# Patient Record
Sex: Female | Born: 1990
Health system: Southern US, Community
[De-identification: ages and names within clinical notes are randomized; demographics above are authoritative.]

## PROBLEM LIST (undated history)

## (undated) DIAGNOSIS — E109 Type 1 diabetes mellitus without complications: Secondary | ICD-10-CM

## (undated) DIAGNOSIS — F909 Attention-deficit hyperactivity disorder, unspecified type: Secondary | ICD-10-CM

## (undated) DIAGNOSIS — F419 Anxiety disorder, unspecified: Secondary | ICD-10-CM

## (undated) DIAGNOSIS — Z8619 Personal history of other infectious and parasitic diseases: Secondary | ICD-10-CM

## (undated) DIAGNOSIS — A64 Unspecified sexually transmitted disease: Secondary | ICD-10-CM

## (undated) DIAGNOSIS — U071 COVID-19: Secondary | ICD-10-CM

## (undated) DIAGNOSIS — R112 Nausea with vomiting, unspecified: Secondary | ICD-10-CM

## (undated) DIAGNOSIS — R519 Headache, unspecified: Secondary | ICD-10-CM

## (undated) DIAGNOSIS — Z9889 Other specified postprocedural states: Secondary | ICD-10-CM

## (undated) DIAGNOSIS — Z98891 History of uterine scar from previous surgery: Secondary | ICD-10-CM

## (undated) HISTORY — DX: Unspecified sexually transmitted disease: A64

## (undated) HISTORY — DX: COVID-19: U07.1

## (undated) HISTORY — DX: Anxiety disorder, unspecified: F41.9

## (undated) HISTORY — DX: Type 1 diabetes mellitus without complications: E10.9

## (undated) HISTORY — PX: WISDOM TOOTH EXTRACTION: SHX21

## (undated) HISTORY — DX: Attention-deficit hyperactivity disorder, unspecified type: F90.9

---
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
## 2010-09-03 ENCOUNTER — Ambulatory Visit: Payer: Self-pay | Admitting: Family Medicine

## 2010-09-09 ENCOUNTER — Ambulatory Visit: Payer: Self-pay | Admitting: Family Medicine

## 2011-12-05 ENCOUNTER — Emergency Department: Payer: Self-pay | Admitting: *Deleted

## 2013-12-05 ENCOUNTER — Encounter: Payer: Self-pay | Admitting: Obstetrics & Gynecology

## 2013-12-09 ENCOUNTER — Encounter: Payer: Self-pay | Admitting: Obstetrics and Gynecology

## 2014-01-06 ENCOUNTER — Encounter: Payer: Self-pay | Admitting: Maternal and Fetal Medicine

## 2014-01-28 ENCOUNTER — Encounter: Payer: Self-pay | Admitting: Pediatric Cardiology

## 2014-02-17 ENCOUNTER — Encounter: Payer: Self-pay | Admitting: Maternal & Fetal Medicine

## 2014-03-17 ENCOUNTER — Encounter: Payer: Self-pay | Admitting: Obstetrics and Gynecology

## 2014-04-14 ENCOUNTER — Encounter: Payer: Self-pay | Admitting: Maternal & Fetal Medicine

## 2014-04-21 ENCOUNTER — Encounter: Payer: Self-pay | Admitting: Obstetrics and Gynecology

## 2014-05-12 ENCOUNTER — Encounter: Payer: Self-pay | Admitting: Maternal & Fetal Medicine

## 2014-05-23 ENCOUNTER — Inpatient Hospital Stay: Payer: Self-pay | Admitting: Obstetrics and Gynecology

## 2014-05-23 LAB — CBC WITH DIFFERENTIAL/PLATELET
BASOS ABS: 0.1 10*3/uL (ref 0.0–0.1)
Basophil %: 0.7 %
EOS ABS: 0 10*3/uL (ref 0.0–0.7)
Eosinophil %: 0.4 %
HCT: 34.7 % — ABNORMAL LOW (ref 35.0–47.0)
HGB: 11.4 g/dL — ABNORMAL LOW (ref 12.0–16.0)
LYMPHS PCT: 22.5 %
Lymphocyte #: 2.1 10*3/uL (ref 1.0–3.6)
MCH: 27.9 pg (ref 26.0–34.0)
MCHC: 32.8 g/dL (ref 32.0–36.0)
MCV: 85 fL (ref 80–100)
MONO ABS: 0.9 x10 3/mm (ref 0.2–0.9)
MONOS PCT: 10.1 %
NEUTROS ABS: 6.1 10*3/uL (ref 1.4–6.5)
NEUTROS PCT: 66.3 %
PLATELETS: 296 10*3/uL (ref 150–440)
RBC: 4.07 10*6/uL (ref 3.80–5.20)
RDW: 13 % (ref 11.5–14.5)
WBC: 9.2 10*3/uL (ref 3.6–11.0)

## 2014-05-23 LAB — GC/CHLAMYDIA PROBE AMP

## 2014-05-24 LAB — HEMATOCRIT: HCT: 31.6 % — AB (ref 35.0–47.0)

## 2015-01-24 NOTE — Consult Note (Signed)
Referral Information:  Reason for Referral Followup consultation for type I diabetes in pregnancy   Referring Physician [facility]   Prenatal Hx Tsosie BillingBrandice is a 24 year-old G1 P0 at 4923 4/7 weeks ([facility] who returns for followup consultation in setting of Type I diabetes. She had a [facility].  She has an [facility] on 01/22/14 and [facility].  Wyolene is using her pump. Her A1C at start of pregnancy was 11 and then down to 8.8 in early March. She reports that her most recent A1C was 6%. These are being performed by her endocrinologist.  She calls her blood glucose values in to her endocrinologist weekly and adjustments are made then.  She also checking 3 am values.  She has no complaints.   Home Medications:  Medication Instructions Status  Prenatal Multivitamins Prenatal Multivitamins oral tablet 1 tab(s) orally once a day Active  NovoLOG 100 units/mL subcutaneous solution 35.4 unit(s) subcutaneous  Active  Diclegis 10 mg-10 mg oral delayed release tablet 1 tab(s) orally once a day (at bedtime), As Needed - for Nausea, Vomiting Active  Aspir 81 81 mg oral tablet 1 tab(s) orally once a day Active   Allergies:   Penicillin: Anaphylaxis  Vital Signs/Notes:  Nursing Vital Signs: **Vital Signs.:   18-May-15 09:10  Vital Signs Type Routine  Temperature Temperature (F) 97.3  Celsius 36.2  Temperature Source oral  Pulse Pulse 110  Respirations Respirations 18  Systolic BP Systolic BP 119  Diastolic BP (mmHg) Diastolic BP (mmHg) 66  Mean BP 83  Pulse Ox % Pulse Ox % 98  Pulse Ox Activity Level  At rest  Oxygen Delivery Room Air/ 21 %   Perinatal Consult:  PMed Hx Rubella Nonimmune    Additional Lab/Radiology Notes Review of sugar log (May 1-May 12, recent values have not been uploaded to web site; May 8th excluded--injection site problem resulted in  hyperglycemia) FS: [phone],63,188,146,136,89,195,219 2hr PP breakfast: 130,128,46,98,102,201,178.118,198,234 2hr PP lunch: 562,130,865,784,69,629,528,413137,253,172,119,99,154,125,264 2hr PP dinner: 115,141,92,109,163,117,169,103,235,101,88 May 8: 206/201/254/86   Impression/Recommendations:  Impression 24 year-old G1 P0 at 17 6/7 weeks with type I diabetes and markedly elevated hemoglobin A1c at time of conception (11%).   Although her her most recent A1C is down to 6%, overall, her glycemic control has worsened.  Her basal rate was recently decreased (due to a few episodes of symptomatic hypoglycemia) and should probably be increased given the increase in insulin resistance associated with advancing gestation.  I was able to contact Spenser about these changes and she will discuss further with her Endocrinologist.   Recommendations -I am concerned that her glycemic control is worsening. Her basal rate should probably be increased.  She may benefit from a food diary.  -She reports a [facility] and a normal fetal echocardiogram with [facility].  -Pt has been counseled regarding the use of a daily baby aspirin to decrease the risk for preeclampsia and is taking daily. -Continue insulin pump. She is providing her sugars weekly to her endocrinologist, who is then making adjustments in her pump settings. Continue to send sugars to endocrinology weekly--she will address worsening values after most recent changes and will try to keep track of her diet.  -Return in 4 weeks.  We have scheduled a growth ultrsound for the same date.  Please cancel this ultrasound if more convenient to the patient to  perform at [facility].  -Antenatal surveillance as outlined below.   Plan:  Ultrasound at what gestational ages Monthly > 28 weeks   Antepartum Testing Starting at 32 weeks, Weekly, Twice weekly testing beginning at 34 weeks    Total Time Spent with Patient 15  minutes   >50% of visit spent in couseling/coordination of care yes   Office Use Only 99213  Office Visit Level 3 ( ) EST exp prob focused outpt   Coding Description: MATERNAL CONDITIONS/HISTORY INDICATION(S).   Diabetes - DM.  Electronic Signatures: Consuelo Pandy (MD)  (Signed 18-May-15 14:24)  Authored: Referral, Home Medications, Allergies, Vital Signs/Notes, Consult, Lab/Radiology Notes, Impression, Plan, Billing, Coding Description   Last Updated: 18-May-15 14:24 by Consuelo Pandy (MD)

## 2015-01-24 NOTE — Op Note (Signed)
PATIENT NAME:  Jordan Leonard, Merlinda S MR#:  [id number] DATE OF BIRTH:  02/13/1991  DATE OF PROCEDURE:  05/23/2014  PREOPERATIVE DIAGNOSES: 1. Nonreassuring fetal monitoring. 2.  37 + 1 weeks estimated gestational age.  3. Insulin-dependent diabetic.   POSTOPERATIVE DIAGNOSES:  1. Nonreassuring fetal monitoring. 2.  37 + 1 weeks estimated gestational age.  3. Insulin-dependent diabetic.   PROCEDURE: Primary low transverse cesarean section.   SURGEON: Suzy Bouchardhomas J Monserat Prestigiacomo, M.D.   ANESTHESIA: Spinal.   INDICATION: A 24 year old gravida 1, para 0 patient, insulin-dependent diabetic on insulin pump came in at 4837 + 1 weeks estimated gestational age for an induction. The patient underwent Cervidil induction and had intermittent late decelerations with the Cervidil. This was removed. The patient continued to have contractions and developed fetal tachycardia and nonreassuring fetal monitoring.   PROCEDURE: After adequate spinal anesthesia, the patient was placed in the dorsal supine position with a hip roll under the right side. The patient received clindamycin prophylaxis with 120 mg gentamicin given her severe allergic reaction to penicillin. The patient was prepped and draped in normal sterile fashion. A Pfannenstiel incision was made 2 fingerbreadths above the symphysis pubis. Sharp dissection was used to identify the fascia. The fascia was opened in the midline and opened in a transverse fashion. The superior aspect of the fascia was grasped with Kocher clamps and the recti muscles dissected free. The inferior aspect of the fascia was grasped with Kocher clamps. Pyramidalis muscle was dissected free. Entry into the peritoneal cavity was accomplished sharply. The vesicouterine peritoneal fold was identified and opened in a transverse fashion. Bladder was reflected inferiorly. A low transverse uterine incision was made. Upon entry into the endometrial cavity, clear fluid resulted. The incision was  extended with blunt transverse traction. A loose nuchal cord was identified as head was delivered. Shoulders and body delivered without difficulty. A vigorous female was passed to pediatrician staff who assigned Apgar scores of 7 and 8. Placenta was manually delivered and the uterus was exteriorized. The endometrial cavity was wiped clean with laparotomy tape and the cervix was opened with a ring forceps and this was passed off the operative field. The uterine incision was closed with 1 chromic suture in a running locking fashion with good approximation of edges. Good hemostasis was noted. Fallopian tubes and ovaries appeared normal. The posterior cul-de-sac was irrigated and suctioned and the uterus was placed back into the abdominal cavity. The paracolic gutters were wiped clean with laparotomy tape and the uterine incision again appeared hemostatic. Interceed was placed over the uterine incision in a T-shaped fashion. The fascia was then closed with 0 Vicryl in a running nonlocking fashion with good approximation of edges. Good hemostasis was noted. Subcutaneous tissues were irrigated and bovied for hemostasis. The skin was reapproximated with staples. There were no complications. Estimated blood loss 500 mL. Intraoperative fluids: 1000 mL. The patient tolerated the procedure well and was taken to the recovery room in good condition.    ____________________________ Suzy Bouchardhomas J. Anaika Santillano, MD tjs:JT D: 05/23/2014 22:00:19 ET T: 05/24/2014 02:32:00 ET JOB#: [id number]  cc: Suzy Bouchardhomas J. Narciso Stoutenburg, MD, <Dictator> Suzy BouchardHOMAS J Delmer Kowalski MD ELECTRONICALLY SIGNED 05/24/2014 11:26

## 2015-01-24 NOTE — Consult Note (Signed)
Referral Information:  Reason for Referral Followup consultation for type I diabetes in pregnancy   Ref[facility] Physician [facility]   Prenatal Hx Jordan Leonard is a 24 year-old G1 P0 at 327 3/7 weeks (EDC 06/13/14) who returns for followup consultation in setting of Type I diabetes. She had a [facility].  She has [facility] on 01/22/14 and [facility].  WIth her pump, Jordan Leonard has brought her A1C  down from 11 at start of pregnancy to 8.8 in early March. She reports that her most recent A1C was 6.5%. These are being performed by her endocrinologist.  She calls her blood glucose values in to her endocrinologist weekly and adjustments are made then.  She also checking 3 am values.  She has no complaints.   Home Medications: Medication Instructions Status  Prenatal Multivitamins Prenatal Multivitamins oral tablet 1 tab(s) orally once a day Active  NovoLOG 100 units/mL subcutaneous solution 35.4 unit(s) subcutaneous  Active  Diclegis 10 mg-10 mg oral delayed release tablet 1 tab(s) orally once a day (at bedtime), As Needed - for Nausea, Vomiting Active  Aspir 81 81 mg oral tablet 1 tab(s) orally once a day Active   Allergies:   Penicillin: Anaphylaxis  Vital Signs/Notes:  Nursing Vital Signs:  **Vital Signs.:   15-Jun-15 14:00  Vital Signs Type Routine  Temperature Temperature (F) 98.6  Celsius 37  Temperature Source oral  Pulse Pulse 109  Respirations Respirations 18  Systolic BP Systolic BP 130  Diastolic BP (mmHg) Diastolic BP (mmHg) 75  Mean BP 93  Pulse Ox % Pulse Ox % 98  Pulse Ox Activity Level  At rest  Oxygen Delivery Room Air/ 21 %   Perinatal Consult:  PMed Hx Rubella Nonimmune     US:    15-Jun-15 14:44, MFM OB US, Follow Up, Per Fetus  MFM OB US, Follow Up, Per Fetus   Indication: Diabetes, Growth.    ____________________________________________________________________________  History: Age: 72 years.  ____________________________________________________________________________  Dating:  LMP:     09/06/2013     EDC:     06/13/2014     GA by LMP:     8747w3d  Earlier Assessment on:     12/05/2013     EDC:     06/15/2014     GA by earlier  assessment:     3362w1d  Current Scan on:     03/17/2014     EDC:     06/16/2014     GA by current scan:      8562w0d  Best Overall Assessment:     03/17/2014     EDC:     06/13/2014     Assessed GA:      1247w3d  The calculation of the gestational age by current scan was based on BPD, HC, AC,  FL and HUM.  The Best Overall Assessment is based on the LMP.  ____________________________________________________________________________  General Evaluation:  Fetal heart activity: present. Fetal heart rate: 130 bpm.   Presentation: breech.   Fetal movement: visible.   Amniotic Fluid: normal. AFI  13.5 cm.   Cord: 3 vessels.   Placenta: posterior fundal. No previa. Placenta Grade: Grade 0. Structure:  normal.     ____________________________________________________________________________  Anatomy Scan:  Singleton gestation.  Biometry:  BPD     63.1     mm      -  [redacted]w[redacted]d     ([redacted]w[redacted]d to [redacted]w[redacted]d)  HC     250.8     mm      -     [redacted]w[redacted]d     ([redacted]w[redacted]d to [redacted]w[redacted]d)  AC     237.2     mm      -     [redacted]w[redacted]d     ([redacted]w[redacted]d to [redacted]w[redacted]d)  FL     48.7     mm      -     [redacted]w[redacted]d     ([redacted]w[redacted]d to [redacted]w[redacted]d)  HUM     47.0     mm      -     [redacted]w[redacted]d  EFW (lbs/oz)     2 lbs     5 ozs  EFW (g)     1062     g     34th%       Fetal Anatomy:  Visualized with normal appearance: spine, gastrointestinal tract, kidneys,  bladder.    Brain: visualized previously.   Face: visualized previously.   Heart: 4-chamber heart and greatartery views were visualized normally.   Abdominal Wall: visualized previously.   Extremities: visualized  previously.    ____________________________________________________________________________  Maternal Structures:  Cervical length 38 mm.  ____________________________________________________________________________  Report Summary:  Impression: Single intrauterine pregnancy with an estimated gestational age of  [redacted] weeks 3 day(s).  Dating assigned by LMP concordant with ultrasound performed  at [facility] on 12/05/2013; measurements at that exam reported as  12 weeks 4 days.    Follow up ultrasound performed to assess growth.    Fetal anatomy visualized appears normal. Other views of fetal anatomy have  been documented previously.     Appropriate interval growth.    Recommend follow up ultrasound for growth in 4 weeks. CODING DESCRIPTION:   Diabetes.   Recommendations:     Thank you for allowing [place] to participate in her care.  Electronically signed by:  Kirby Funk, MD   Impression/Recommendations:  Impression 23 year-old G1P0 at 64 3/7 weeks with type I diabetes and markedly elevated hemoglobin A1c at time of conception (11%).   She has been able to bring her A1C down to 6.5% as of last week.  FBS range from 54-171 2hr post breakfast range from 108-180 2hr post lunch range from 85-181 2hr post dinner range from 72-141   Recommendations - Continue close surveillance and adjuments of her insulin regimen per endocrine recommendations. - She is s/p normal anatomic survey at [facility] on 4/22 and a normal fetal echocardiogram with [facility] on 4/28.  - She continues the use of a daily baby aspirin to decrease the risk for preeclampsia. - Last optho exam was last week  - Return in 4 weeks.  We have scheduled a growth ultrsound for the same date.  Please cancel this ultrasound if more convenient to the patient to perform at [facility].  - Antenatal surveillance weekly starting at 32 weeks, increasing to 2x weekly at 34  weeks; recommend delivery between 37-39 weeks depending on overall BS control and fetal well-being.   Plan:  Ultrasound at what gestational ages Monthly > 28 weeks   Antepartum Testing Starting at 32 weeks, Weekly, Twice weekly testing beginning at 34 weeks    Total Time Spent with Patient 15 minutes   >50% of visit spent in couseling/coordination of care yes   Office Use Only 99213  Office Visit Level 3 ( ) EST  exp prob focused outpt   Coding Description: MATERNAL CONDITIONS/HISTORY INDICATION(S).   Diabetes - DM.  Electronic Signatures: Kirby Funk (MD)  (Signed 15-Jun-15 15:12)  Authored: Referral, Home Medications, Allergies, Vital Signs/Notes, Consult, Radiology, Impression, Plan, Billing, Coding Description   Last Updated: 15-Jun-15 15:12 by Kirby Funk (MD)

## 2015-01-24 NOTE — Consult Note (Signed)
Referral Information:  Reason for Referral type 1 Diabetes on pump now pregnant   Referring Physician Gavin Potters- Acquanetta Belling CNM , Dr Arlice Colt Endocrine   Prenatal Hx 24 yo G1 P0 LMP 09/06/13 [facility] 06/12/14  s/p first tri screen and genetic counseling She is on a pump last hgb A1c 8.8, 11 at beginning of pregnancy  She is schedueld to discuss impact of type 1 DM on pregnancy, and pregnancy on her diabetes   Past Obstetrical Hx primigravida   Home Medications: Medication Instructions Status  NovoLOG 100 units/mL subcutaneous solution 37.5 unit(s) subcutaneous continuous basal rate with bolus for meals  Active  Prenatal Multivitamins Prenatal Multivitamins oral tablet 1 tab(s) orally once a day Active   Allergies:   Penicillin: Anaphylaxis  Vital Signs/Notes:  Nursing Vital Signs:  **Vital Signs.:   09-Mar-15 10:15  Vital Signs Type Routine    10:15  Pulse Pulse 102  Respirations Respirations 20    10:15  Systolic BP Systolic BP 135  Diastolic BP (mmHg) Diastolic BP (mmHg) 72  Mean BP 93    10:15  Pulse Ox % Pulse Ox % 100  Pulse Ox Activity Level  At rest  Oxygen Delivery Room Air/ 21 %   Perinatal Consult:  LMP 06-Sep-2013   PGyn Hx infertility  took 18 months to conceive   PMed Hx Rubella Nonimmune   Past Medical History cont'd 1 type 1 Diabetes- diagnosed at age 54 in [place] . She was not gaining weight and had back aches . Her mother recognized her excessive thirst and took her in and found her BS to be 530 admitted for early DKA . She had a number of providers, her dad was in the army. Other than initial dx she has not required hospitalization. She has been on a pump for 10 years . She had a CGM but found it to be too expensive to operate. Her initial Hgb A1c was 11 - She says she couldn't afford the pump supplies for a while.She has brought her hgb A1c  down to 8.8. She has an ophthamologist Dr Clydene Pugh in [place] that she saw in December. She denies any h/o  renal disease or foot ulcers.  She was being followed by [facility] and has now transferred her care to dr Jefm Petty at [place] . They are communicating every 7-10 days to adjust her pump. She recognizes her lows and will wake up - her recent lowest was 47 . She ahs glucagon and her husband knows how to use it. She uses about 37 units total /day basal and counts carbs. 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   Soc Hx married   Review Of Systems:  Subjective occ nausea   Tolerating Diet Yes  Nauseated   Medications/Allergies Reviewed Medications/Allergies reviewed    Additional Lab/Radiology Notes frist tri screen - pending RBS 150 pt showed me her medtronic flow sheet BS fluctuates between 67 -221   Impression/Recommendations:  Impression IUP at 13w 3d s/p first tri screen  1Type 1 dm on pump with elevated hgb A1c, no  end organ damage , feels lows , some difficutly acessing supplies , discussing care with Dr Jefm PettySalum q 7 -10 d  2rubella non immune 3elevated BMI 4 PCN allergy - anaphylaxis   Recommendations 1 I encouraged her  in her efforts to normalize her blood sugar, We discussed risk to her and her fetus of DM in pregnancy. I reviewed risk of lows, esp  in early with placental transfer pulling down her blood sugar further and faster. I reviewed risk of DKA esp as pregnancy progresses  2 I reviewed risk of fetal anomalies and recommended detailed u/s and echo  .  3 we reviewed risk to fetus of macrosomia and IUFD - I suggested serial scans in third for growht and twice weekly NSts at 32 weeks  4 we discussed delivery at  37-38 weeks given challenges in controlling her blood sugar, (if BS becomes more consistently normal and fetus normally grown could consider delivery at 39 ) we reviewed risk of cesarean at about 50% 5 Increased risk of preeclampsia pt reports no renal damage but I  still quoted an increased risk of preeclampsia - I recommended she start baby aspirin daily to decrease risk . 6 needs baseline TFTs and p/c ratio if not done already  7 10-15% risk of Type 1 DM for offspring - I encouraged breast feeding for health benefits including decreasing the risk of DM for infant 8pp rubella vaccine   Plan:  Genetic Counseling done 12/05/13   Prenatal Diagnosis Options Level II US, Fetal echo   Ultrasound at what gestational ages Monthly >24 weeks   Antepartum Testing Twice weekly, Starting at 32 weeks   Delivery Mode depending on EFW   Additional Testing Thyroid panel, HbA1C, baseline P/ C ratio   Delivery at what gestational age 26-39 w    Total Time Spent with Patient 30 minutes   >50% of visit spent in couseling/coordination of care yes   Office Use Only 99242  Level 2 (30min) NEW office consult exp prob focused   Coding Description: MATERNAL CONDITIONS/HISTORY INDICATION(S).   Diabetes - DM.  Electronic Signatures: Rondall AllegraLivingston, Doraine Schexnider Gresham (MD)  (Signed 09-Mar-15 12:46)  Authored: Referral, Home Medications, Allergies, Vital Signs/Notes, Consult, Exam, Lab/Radiology Notes, Impression, Plan, Billing, Coding Description   Last Updated: 09-Mar-15 12:46 by Rondall AllegraLivingston, Samella Lucchetti Gresham (MD)

## 2015-01-24 NOTE — Consult Note (Signed)
Referral Information:  Reason for Referral Followup consultation for type I diabetes in pregnancy   Ref[facility] Physician [facility]   Prenatal Hx Jordan Leonard is a 24 year-old G1 P0 at 4532 3/7 weeks (EDC 06/13/14) who returns for followup consultation in setting of Type I diabetes. She had a [facility].  She had [facility] on 01/[facility]/14 and a [facility] fetal [facility] on 01/28/14.  With her pump, Jordan Leonard has brought her A1C  down from 11 at start of pregnancy to a most recent A1C of 6.6%.  She sees her endocrinologist monthly and her BS are reviewed by her endocrinologist on a weekly basis so that adjustments can be made.   She has no complaints and feels that she is really maximizing her efforts to control her BS with meal planning and the pump.   Home Medications: Medication Instructions Status  Prenatal Multivitamins Prenatal Multivitamins oral tablet 1 tab(s) orally once a day Active  Diclegis 10 mg-10 mg oral delayed release tablet 1 tab(s) orally once a day (at bedtime), As Needed - for Nausea, Vomiting Active  Aspir 81 81 mg oral tablet 1 tab(s) orally once a day Active  NovoLOG 100 units/mL subcutaneous solution 38.55 unit(s) subcutaneous  Active   Allergies:   Penicillin: Anaphylaxis  Vital Signs/Notes:  Nursing Vital Signs: **Vital Signs.:   20-Jul-15 13:43  Vital Signs Type Routine  Temperature Temperature (F) 97.8  Celsius 36.5  Temperature Source oral  Pulse Pulse 112  Respirations Respirations 18  Systolic BP Systolic BP 142  Diastolic BP (mmHg) Diastolic BP (mmHg) 78  Mean BP 99    13:53  Vital Signs Type Recheck  Systolic BP Systolic BP 131  Diastolic BP (mmHg) Diastolic BP (mmHg) 74  Mean BP 93   Perinatal Consult:  PMed Hx Rubella Nonimmune   Impression/Recommendations:  Impression 24 year-old G1P0 at 2332 3/7 weeks with type I diabetes and markedly elevated hemoglobin A1c at  time of conception (11%).   She has been able to bring her A1C down to 6.6%.  Since her last visit: FBS range from 88-180 2hr post breakfast range from 148-286 (additional changes to her morning humalog were made) 2hr post lunch range from 63-200 (200 was the only outlier) 2hr post dinner range from 62-134 (134 was the only outlier)  Elevated BS are outliers with most BS in the past week closely in range with the exception for fasting and 2hr post breakfast.  Jordan Leonard has been working with endocrine closely to improve these numbers and the most recent ones have been significantly better.   Recommendations - Continue close surveillance and adjuments of her insulin regimen per endocrine recommendations. - She is s/p [facility] on [facility]/22 and a normal fetal echocardiogram with [facility] on 4/28.  - Continue daily baby aspirin to decrease the risk for preeclampsia. - Optho exam up to date - Return in 4 weeks.  We have scheduled a growth ultrasound for the same date.  Please cancel this ultrasound if more convenient to the patient to [facility].   -Antenatal surveillance weekly starting at 32 weeks, increasing to 2x weekly at 34 weeks; recommend delivery between 37-39 weeks depending on overall BS control and fetal well-being.  I congratulated Jordan Leonard on how hard she has been working as exemplified by the improvements in her BS.  She has been able to avoid overt lows  and she is aware that we are not aiming to treat outliers with changes in her insulin regimen but would rather look at dietary indiscretions.  Would still be careful about over correcting to avoid lows.  Morning BS remain high but she and her endocrinologist are working to improve these and the most recent post prandial breakfasts have been in the 140-150's range.  Consider insulin drip in labor and endocrine to make post delivery recommendations to her pump  settings.   Plan:  Ultrasound at what gestational ages Monthly > 28 weeks   Antepartum Testing Starting at 32 weeks, Weekly, Twice weekly testing beginning at 34 weeks    Total Time Spent with Patient 15 minutes   >50% of visit spent in couseling/coordination of care yes   Office Use Only 99213  Office Visit Level 3 ( ) EST exp prob focused outpt   Coding Description: MATERNAL CONDITIONS/HISTORY INDICATION(S).   Diabetes - DM.  Electronic Signatures: Kirby Funk (MD)  (Signed 20-Jul-15 14:21)  Authored: Referral, Home Medications, Allergies, Vital Signs/Notes, Consult, Impression, Plan, Billing, Coding Description   Last Updated: 20-Jul-15 14:21 by Kirby Funk (MD)

## 2015-01-24 NOTE — Consult Note (Signed)
Referral Information:  Reason for Referral Follow-up consultation for type I diabetes in pregnancy.   Referring Physician [facility]   Prenatal Hx Jordan Leonard BillingBrandice is a 24 year-old G1 P0 at 1835 3/7 weeks (EDC 06/13/14) who returns for follow-up consultation in the setting of Type I diabetes. She had a normal first trimester screen at [facility].  She had an anatomy scan at [facility] on 01/22/14 and a [facility].  With her pump, [name] has brought her A1C  down from 11 at start of pregnancy to a most recent A1C of 6.6% on 04/23/2014.  She has been seeing her endocrinologist, Dr. Tedd SiasSolum, monthly and her BS are reviewed by her endocrinologist on a weekly basis so that adjustments can be made.   Home Medications: Medication Instructions Status  Diclegis 10 mg-10 mg oral delayed release tablet 1 tab(s) orally once a day (at bedtime), As Needed - for Nausea, Vomiting Active  Aspir 81 81 mg oral tablet 1 tab(s) orally once a day Active  NovoLOG 100 units/mL subcutaneous solution 38.6 unit(s) subcutaneous  Active   Allergies:   Penicillin: Anaphylaxis  Vital Signs/Notes:  Nursing Vital Signs: **Vital Signs.:   10-Aug-15 14:52  Vital Signs Type Routine  Temperature Temperature (F) 97.1  Celsius 36.1  Temperature Source oral  Pulse Pulse 116  Respirations Respirations 18  Systolic BP Systolic BP 131  Diastolic BP (mmHg) Diastolic BP (mmHg) 69  Pulse Ox % Pulse Ox % 100  Oxygen Delivery Room Air/ 21 %   Leonard Consult:  PMed Hx Rubella Nonimmune   Review Of Systems:  Subjective Nausea, which is no worse, has persisted the entire pregnancy.   Fever/Chills No   Cough No   Abdominal Pain No   Diarrhea No   Constipation No   Nausea/Vomiting No vomiting   SOB/DOE No   Chest Pain No   Dysuria No   Tolerating Diet Yes   Medications/Allergies Reviewed Medications/Allergies reviewed   Exam:  Today's Weight 189 lbs  BMI=36   US:    10-Aug-15 15:11, MFM OB US, Follow Up, Per Fetus  MFM OB US, Follow Up, Per Fetus   History: Age: 24 years.  ____________________________________________________________________________  Dating:  LMP:     09/06/2013     EDC:     06/13/2014     GA by LMP:     3398w3d  Current Scan on:     05/12/2014     EDC:     06/13/2014     GA by current scan:      4498w3d  Best Overall Assessment:     05/12/2014     EDC:     06/13/2014     Assessed GA:      4498w3d  The calculation of the gestational age by current scan was based on BPD, HC, AC,  FL and HUM.  The Best Overall Assessment is based on the ultrasound examination on  05/12/2014.  ____________________________________________________________________________  Anatomy Scan:  Singleton gestation.  Biometry:  BPD     85.3     mm      -     6167w3d     (6274w2d to 2798w3d)  HC     311.3     mm     -     5166w6d     (7711w6d to 324w6d)  AC     324.0     mm      -  [redacted]w[redacted]d     ([redacted]w[redacted]d to [redacted]w[redacted]d)  FL     69.3     mm      -     [redacted]w[redacted]d     ([redacted]w[redacted]d to [redacted]w[redacted]d)  HUM     60.9     mm      -     [redacted]w[redacted]d  EFW (lbs/oz)     6 lbs     1 ozs  EFW (g)     2762     g    58th%     Fetal heart activity: present. Fetal heart rate: 149 bpm.   Fetal presentation: cephalic.   Amniotic fluid: normal. AFI  17.4 cm.   Cord: visualized previously.   Placenta: posterior.     Fetal Anatomy:  Head: Appears normal.   Brain: visualized previously.   Face: Appears normal.   Spine: Appears normal  Neck / Skin: Appears normal.   Thorax: Appears normal.   Heart: Appears normal.   Abdominal Wall: Appears normal.   Gastrointestinal Tract: Appears normal.   Kidneys / Adrenal Glands: Appears normal.   Bladder: Appears normal.   Genitalia: Visualized previously and was female.   Extremities: Appears normal.   Skeleton: Appears normal.    ____________________________________________________________________________  Report Summary:  Impression: Single intrauterine  pregnancy with an estimated gestational age of  [redacted] weeks 3 day(s).  Dating assigned by LMP concordant with ultrasound performed  at [facility] on 12/05/2013; measurements at that exam reported as  12 weeks 4 days.    The patient was originally referred by [facility] for recommendations  due to the patient's type 1 diabetes.      A follow-up ultrasound was performed today to assess growth.  A prior fetal  echo was normal.    Presentation today is CEPHALIC.  Fetal anatomy visualized today or  visualized previously appears normal.     Appropriate interval growth.  The amniotic fluid volume is normal. Normal  fetal movement are seen. CODING DESCRIPTION:76816. Ultrasound for fetal growth.   Type1 diabetes in pregnancy.   Recommendations: The patient is undergoing twice weekly testing and will be  delivered at [redacted] weeks gestation.     During today's visit, we discussed our protocol for discontinuing the pump  and using an insulin drip in active labor, continuing after delivery until the  patient is taking solid food again postpartum.  Dr. Tedd Sias, her endocrinologist  can recommend a postpartum insulin regimen.  This is usually the prepregnant  regimen or approximately half the pre-deliveryregimen.    The patient had the opportunity to meet with Dr. Joselyn Glassman, the NICU director,  about the special needs of infants of diabetic mothers.    Thank you for allowing [place] to participate in her care.  Electronically signed by:  Argentina Ponder, MD    Additional Lab/Radiology Notes BS review FBs 80-100 2hrs PP breakfast 117-160 2 hrs PP lunch 90-120 2 hrs PP dinner 90-120  Lowest BSs have been in the 60s   Impression/Recommendations:  Impression 24 year-old G1P0 at 53 3/7 weeks with type I diabetes approaching delivery - cephalic presentation.  EFW 48%tile, normal fluid.  No contraindication to vaginal delivery.   Recommendations The patient is undergoing twice weekly  testing.  Delivery is planned at 37 weeks due to type 1 diabetes and consequently imperfectly controlled BSs  During today's visit, we discussed our protocol for discontinuing the pump and using an insulin drip in active labor, continuing after  delivery until the patient is taking solid food again postpartum.  Dr. Tedd Sias, her endocrinologist can recommend a postpartum insulin regimen.  This is usually the prepregnant regimen or approximately half the pre-delivery regimen.  The patient had the opportunity to meet with Dr. Joselyn Glassman, the NICU director, about the special needs of infants of diabetic mothers.   Plan:  Ultrasound at what gestational ages Monthly > 28 weeks   Antepartum Testing Starting at 32 weeks, Weekly, Twice weekly testing beginning at 34 weeks    Total Time Spent with Patient 30 minutes   >50% of visit spent in couseling/coordination of care yes   Office Use Only 99213  Office Visit Level 3 ( ) EST exp prob focused outpt, 99214  Office Visit Level 4 ( ) EST detailed office/outpt   Coding Description: MATERNAL CONDITIONS/HISTORY INDICATION(S).   Diabetes - DM.  Electronic Signatures: Lady Deutscher (MD)  (Signed 10-Aug-15 18:01)  Authored: Referral, Home Medications, Allergies, Vital Signs/Notes, Consult, Exam, Radiology, Lab/Radiology Notes, Impression, Plan, Billing, Coding Description   Last Updated: 10-Aug-15 18:01 by Lady Deutscher (MD)

## 2015-01-24 NOTE — Consult Note (Signed)
Referral Information:  Reason for Referral Followup consultation for type I diabetes in pregnancy   Referring Physician [facility]   Prenatal Hx Jordan Leonard is a 24 year-old G1 P0 at 1 6/7 weeks ([facility] 06/13/14) who returns for followup consultation in setting of Type I diabetes. She had a normal first trimester screen at [facility] and states that a MS-AFP test was sent from [place] and is pending.  She has an anatomy scan scheduled at [place] on 01/22/14 and fetal echo scheduled at [facility] on 02/11/14.  Olivette is using her pump. Her A1C at start of pregnancy was 11 and then down to 8.8 in early March. She reports that her most recent A1C was 7.7%. These are being performed by her endocrinologist.  She calls her sugars in to her endocrinologist weekly and adjustments are made then.  She is periodically also checking 3 am values.  She recently saw her opthamologist and carries a glucagon kit. She has no complaints.   Home Medications: Medication Instructions Status  Prenatal Multivitamins Prenatal Multivitamins oral tablet 1 tab(s) orally once a day Active  NovoLOG 100 units/mL subcutaneous solution 33.7 unit(s) subcutaneous basal rate Active   Allergies:   Penicillin: Anaphylaxis  Vital Signs/Notes:  Nursing Vital Signs: **Vital Signs.:   60-RVI-15 37:94  Systolic BP Systolic BP 327  Diastolic BP (mmHg) Diastolic BP (mmHg) 69    Additional Lab/Radiology Notes Review of sugar log (Mar 21-Mar 29, recent values have not been uploaded to web site): FS: 69, 62, 98, 74, 60, 128, 63, 157 2hr PP breakfast: 153, 148, 184, 154, 152, 128, 157 2hr PP lunch: 185, 81, 141, 147, 115, 195, 165, 122, 156 2hr PP dinner: 156, 142, 102, 118, 138, 130, 211, 211   Impression/Recommendations:  Impression 24 year-old G1 P0 at 78 6/7 weeks with type I diabetes and markedly elevated hemoglobin A1c at time of conception (11%).  She has improved her glycemic  control since then with her most recent A1C down to 7.7%.   Recommendations -We again discussed the importance of optimal glycemic control during pregnancy for both neonatal and maternal benefit -She is aware of risks of congenital anomolies in setting of elevated A1C at time of conception. She has an anatomy scan scheduled at [place] on 4/22.  We will be happy to perform if desired -A fetal echo is scheduled for 5/12 at [place] has been counseled regarding the use of a daily baby aspirin to decrease the risk for preeclampsia -Continue insulin pump. She is providing her sugars weekly to her endocrinologist, who is then making adjustments in her pump settings. Continue to send sugars to endocrinology weekly -Return in 6 weeks    Total Time Spent with Patient 15 minutes   >50% of visit spent in couseling/coordination of care yes   Office Use Only 99213  Office Visit Level 3 (24mn) EST exp prob focused outpt   Coding Description: MATERNAL CONDITIONS/HISTORY INDICATION(S).   Diabetes - DM.  Electronic Signatures: Tyrez Berrios, CMali(MD)  (Signed 06-Apr-15 09:40)  Authored: Referral, Home Medications, Allergies, Vital Signs/Notes, Lab/Radiology Notes, Impression, Billing, Coding Description   Last Updated: 06-Apr-15 09:40 by Luellen Howson, CMali(MD)

## 2015-04-20 DIAGNOSIS — Z9641 Presence of insulin pump (external) (internal): Secondary | ICD-10-CM | POA: Insufficient documentation

## 2015-12-06 ENCOUNTER — Emergency Department
Admission: EM | Admit: 2015-12-06 | Discharge: 2015-12-06 | Disposition: A | Discharge status: Home / Self Care | Payer: BLUE CROSS/BLUE SHIELD | Attending: Emergency Medicine | Admitting: Emergency Medicine

## 2015-12-06 ENCOUNTER — Emergency Department: Payer: BLUE CROSS/BLUE SHIELD

## 2015-12-06 ENCOUNTER — Encounter: Payer: Self-pay | Admitting: Radiology

## 2015-12-06 DIAGNOSIS — R079 Chest pain, unspecified: Secondary | ICD-10-CM | POA: Diagnosis present

## 2015-12-06 DIAGNOSIS — K209 Esophagitis, unspecified without bleeding: Secondary | ICD-10-CM

## 2015-12-06 DIAGNOSIS — K2970 Gastritis, unspecified, without bleeding: Secondary | ICD-10-CM | POA: Diagnosis not present

## 2015-12-06 DIAGNOSIS — R197 Diarrhea, unspecified: Secondary | ICD-10-CM | POA: Insufficient documentation

## 2015-12-06 DIAGNOSIS — Z880 Allergy status to penicillin: Secondary | ICD-10-CM | POA: Insufficient documentation

## 2015-12-06 DIAGNOSIS — R51 Headache: Secondary | ICD-10-CM | POA: Insufficient documentation

## 2015-12-06 DIAGNOSIS — E119 Type 2 diabetes mellitus without complications: Secondary | ICD-10-CM | POA: Insufficient documentation

## 2015-12-06 DIAGNOSIS — Z3202 Encounter for pregnancy test, result negative: Secondary | ICD-10-CM | POA: Diagnosis not present

## 2015-12-06 DIAGNOSIS — J029 Acute pharyngitis, unspecified: Secondary | ICD-10-CM | POA: Diagnosis not present

## 2015-12-06 LAB — COMPREHENSIVE METABOLIC PANEL
ALT: 11 U/L — ABNORMAL LOW (ref 14–54)
ANION GAP: 10 (ref 5–15)
AST: 19 U/L (ref 15–41)
Albumin: 3.9 g/dL (ref 3.5–5.0)
Alkaline Phosphatase: 55 U/L (ref 38–126)
BUN: 21 mg/dL — ABNORMAL HIGH (ref 6–20)
CHLORIDE: 101 mmol/L (ref 101–111)
CO2: 25 mmol/L (ref 22–32)
Calcium: 8.4 mg/dL — ABNORMAL LOW (ref 8.9–10.3)
Creatinine, Ser: 0.72 mg/dL (ref 0.44–1.00)
GFR calc non Af Amer: >60 mL/min (ref >60)
Glucose, Bld: 207 mg/dL — ABNORMAL HIGH (ref 65–99)
POTASSIUM: 4 mmol/L (ref 3.5–5.1)
SODIUM: 136 mmol/L (ref 135–145)
Total Bilirubin: 0.7 mg/dL (ref 0.3–1.2)
Total Protein: 7.2 g/dL (ref 6.5–8.1)

## 2015-12-06 LAB — URINALYSIS COMPLETE WITH MICROSCOPIC (ARMC ONLY)
Bilirubin Urine: NEGATIVE
Glucose, UA: 50 mg/dL — AB
Leukocytes, UA: NEGATIVE
Nitrite: NEGATIVE
PH: 6 (ref 5.0–8.0)
Protein, ur: NEGATIVE mg/dL
SPECIFIC GRAVITY, URINE: 1.01 (ref 1.005–1.030)

## 2015-12-06 LAB — CBC
HCT: 43.8 % (ref 35.0–47.0)
HEMOGLOBIN: 15.3 g/dL (ref 12.0–16.0)
MCH: 29.4 pg (ref 26.0–34.0)
MCHC: 34.9 g/dL (ref 32.0–36.0)
MCV: 84.3 fL (ref 80.0–100.0)
Platelets: 265 10*3/uL (ref 150–440)
RBC: 5.2 MIL/uL (ref 3.80–5.20)
RDW: 12.7 % (ref 11.5–14.5)
WBC: 9.7 10*3/uL (ref 3.6–11.0)

## 2015-12-06 LAB — GLUCOSE, CAPILLARY
GLUCOSE-CAPILLARY: 67 mg/dL (ref 65–99)
Glucose-Capillary: 154 mg/dL — ABNORMAL HIGH (ref 65–99)
Glucose-Capillary: 151 mg/dL — ABNORMAL HIGH (ref 65–99)

## 2015-12-06 LAB — POCT PREGNANCY, URINE: Preg Test, Ur: NEGATIVE

## 2015-12-06 LAB — FIBRIN DERIVATIVES D-DIMER (ARMC ONLY): Fibrin derivatives D-dimer (ARMC): 616 — ABNORMAL HIGH (ref 0–499)

## 2015-12-06 LAB — TROPONIN I: Troponin I: <0.03 ng/mL (ref <0.031)

## 2015-12-06 LAB — LIPASE, BLOOD: LIPASE: 12 U/L (ref 11–51)

## 2015-12-06 MED ORDER — ACETAMINOPHEN 500 MG PO TABS
1000.0000 mg | ORAL_TABLET | Freq: Once | ORAL | Status: AC
  Administered 2015-12-06: 1000 mg via ORAL

## 2015-12-06 MED ORDER — GI COCKTAIL ~~LOC~~
30.0000 mL | Freq: Once | ORAL | Status: AC
  Administered 2015-12-06: 30 mL via ORAL

## 2015-12-06 MED ORDER — SUCRALFATE 1 G PO TABS: 1.0000 g | ORAL_TABLET | Freq: Two times a day (BID) | ORAL | Status: DC

## 2015-12-06 MED ORDER — ONDANSETRON HCL 4 MG/2ML IJ SOLN
4.0000 mg | Freq: Once | INTRAMUSCULAR | Status: AC
  Administered 2015-12-06: 4 mg via INTRAVENOUS

## 2015-12-06 MED ORDER — GI COCKTAIL ~~LOC~~
ORAL | Status: AC
  Filled 2015-12-06: qty 30

## 2015-12-06 MED ORDER — ACETAMINOPHEN 500 MG PO TABS
ORAL_TABLET | ORAL | Status: AC
  Administered 2015-12-06: 1000 mg via ORAL
  Filled 2015-12-06: qty 2

## 2015-12-06 MED ORDER — IOHEXOL 350 MG/ML SOLN
75.0000 mL | Freq: Once | INTRAVENOUS | Status: DC | PRN
  Filled 2015-12-06: qty 75

## 2015-12-06 MED ORDER — SODIUM CHLORIDE 0.9 % IV BOLUS (SEPSIS)
1000.0000 mL | Freq: Once | INTRAVENOUS | Status: AC
  Administered 2015-12-06: 1000 mL via INTRAVENOUS

## 2015-12-06 MED ORDER — BUTALBITAL-APAP-CAFFEINE 50-325-40 MG PO TABS: 1.0000 | ORAL_TABLET | Freq: Four times a day (QID) | ORAL | Status: DC | PRN

## 2015-12-06 MED ORDER — ONDANSETRON HCL 4 MG/2ML IJ SOLN
INTRAMUSCULAR | Status: AC
  Administered 2015-12-06: 4 mg via INTRAVENOUS
  Filled 2015-12-06: qty 2

## 2015-12-06 NOTE — ED Notes (Cosign Needed Addendum)
Pt reports waking up with a headache and chest pain that started today. Pt with diarrhea and vomiting since yesterday multiple episodes. She is a Type 1 diabetic. States she feels dehydrated and activity causes her HR to increase.

## 2015-12-06 NOTE — ED Provider Notes (Signed)
[facility] Emergency Department Provider Note   I have reviewed the triage vital signs and the nursing notes.   HISTORY  Chief Complaint Chest Pain and Diarrhea    HPI Jordan Leonard is a 25 y.o. female who presents with complaints of chest discomfort today. Patient reports yesterday she had nausea vomiting and diarrhea all day long. Today her vomiting has mostly improved but she complains of a burningdiscomfort in her anterior central chest from the xiphoid to her neck. She complains occasionally it feels tight as well. She denies fevers or chills. She is a history of diabetes. She tried taking Tums at home but it did not improve her symptoms      Past Medical History  Diagnosis Date  . Diabetes mellitus without complication (HCC)     There are no active problems to display for this patient.   No past surgical history on file.  Current Outpatient Rx  Name  Route  Sig  Dispense  Refill  . butalbital-acetaminophen-caffeine (FIORICET) 50-325-40 MG tablet   Oral   Take 1-2 tablets by mouth every 6 (six) hours as needed for headache.   20 tablet   0   . sucralfate (CARAFATE) 1 g tablet   Oral   Take 1 tablet (1 g total) by mouth 2 (two) times daily.   60 tablet   1     Allergies Penicillins  No family history on file.  Social History Social History  Substance Use Topics  . Smoking status: None  . Smokeless tobacco: None  . Alcohol Use: None    Review of Systems  Constitutional: Negative for fever. Eyes: Negative for visual changes. ENT: Mild sore throat Cardiovascular: As above Respiratory: Mild pleurisy Gastrointestinal: As above Genitourinary: Negative for dysuria. Musculoskeletal: Negative for back pain. Skin: Negative for rash. Neurological: Mild headache, global Psychiatric: No anxiety    ____________________________________________   PHYSICAL EXAM:  VITAL SIGNS: ED Triage Vitals  Enc Vitals Group     BP  12/06/15 1352 130/83 mmHg     Pulse Rate 12/06/15 1352 112     Resp 12/06/15 1352 18     Temp 12/06/15 1352 98.1 F (36.7 C)     Temp Source 12/06/15 1352 Oral     SpO2 12/06/15 1352 99 %     Weight 12/06/15 1352 185 lb (83.915 kg)     Height 12/06/15 1352 5' 0.5" (1.537 m)     Head Cir --      Peak Flow --      Pain Score 12/06/15 1355 8     Pain Loc --      Pain Edu? --      Excl. in GC? --      Constitutional: Alert and oriented. Well appearing and in no distress. Eyes: Conjunctivae are normal.  ENT   Head: Normocephalic and atraumatic.   Mouth/Throat: Mucous membranes are moist. Cardiovascular: Normal rate, regular rhythm. Normal and symmetric distal pulses are present in all extremities. No murmurs, rubs, or gallops. Respiratory: Normal respiratory effort without tachypnea nor retractions. Breath sounds are clear and equal bilaterally.  Gastrointestinal: Soft and non-tender in all quadrants. No distention. There is no CVA tenderness. Genitourinary: deferred Musculoskeletal: Nontender with normal range of motion in all extremities. No lower extremity tenderness nor edema. Neurologic:  Normal speech and language. No gross focal neurologic deficits are appreciated. Skin:  Skin is warm, dry and intact. No rash noted. Psychiatric: Mood and affect are normal. Patient exhibits  appropriate insight and judgment.  ____________________________________________    LABS (pertinent positives/negatives)  Labs Reviewed  COMPREHENSIVE METABOLIC PANEL - Abnormal; Notable for the following:    Glucose, Bld 207 (*)    BUN 21 (*)    Calcium 8.4 (*)    ALT 11 (*)    All other components within normal limits  URINALYSIS COMPLETEWITH MICROSCOPIC ([facility] ONLY) - Abnormal; Notable for the following:    Color, Urine YELLOW (*)    APPearance CLOUDY (*)    Glucose, UA 50 (*)    Ketones, ur 1+ (*)    Hgb urine dipstick 1+ (*)    Bacteria, UA MANY (*)    Squamous Epithelial / LPF 6-30  (*)    All other components within normal limits  GLUCOSE, CAPILLARY - Abnormal; Notable for the following:    Glucose-Capillary 154 (*)    All other components within normal limits  FIBRIN DERIVATIVES D-DIMER ([facility] ONLY) - Abnormal; Notable for the following:    Fibrin derivatives D-dimer ([facility]) 616 (*)    All other components within normal limits  GLUCOSE, CAPILLARY - Abnormal; Notable for the following:    Glucose-Capillary 151 (*)    All other components within normal limits  LIPASE, BLOOD  CBC  TROPONIN I  GLUCOSE, CAPILLARY  POCT PREGNANCY, URINE    ____________________________________________   EKG  ED ECG REPORT I, Jene Every, the attending physician, personally viewed and interpreted this ECG.   Date: 12/06/2015  EKG Time: 2:08 PM  Rate: 110  Rhythm: sinus tachycardia  Axis: Normal  Intervals:none  ST&T Change: Nonspecific   ____________________________________________    RADIOLOGY I have personally reviewed any xrays that were ordered on this patient: CT angio of the chest unremarkable  ____________________________________________   PROCEDURES  Procedure(s) performed: none  Critical Care performed: none  ____________________________________________   INITIAL IMPRESSION / ASSESSMENT AND PLAN / ED COURSE  Pertinent labs & imaging results that were available during my care of the patient were reviewed by me and considered in my medical decision making (see chart for details).  Patient presents with chest burning and tightness today after having nausea vomiting and diarrhea. She reports the pain worsens somewhat with a deep breath. Her EKG shows sinus tachycardia. I'll add on a troponin and d-dimer although I'm suspicious this is related to gastritis/esophagitis from vomiting.  Patient's d-dimer is mildly elevated we will obtain CT angiography of the chest  CT scan is unremarkable. Troponin is normal  Patient did get improvement with GI  cocktail  We will treat with sucralfate and have the patient follow-up with her PCP. Return precautions discussed at length  ____________________________________________   FINAL CLINICAL IMPRESSION(S) / ED DIAGNOSES  Final diagnoses:  Esophagitis  Gastritis     Jene Every, MD 12/06/15 2115

## 2015-12-06 NOTE — ED Notes (Signed)
Upon discharge vitals pt HR 107, Dr. Cyril LoosenKinner notified, discharge placed on hold for fluids, will continue to mointor

## 2015-12-06 NOTE — ED Notes (Signed)
Pt with repeat blood sugar of 67. Juice provided per pt request. Dr. Cyril Loosenkinner notified. Dr. Cyril Loosenkinner ok with discharging pt at this time. Pt states will take fsbs on arrival to home after discharge. Pt denies s/s of hypoglycemia. Skin pwd, pt in no acute distress.

## 2015-12-06 NOTE — ED Notes (Addendum)
Pt states she had vomiting and diahera yesterday and body aches began today, states this AM she had headache and chest tightness and states "It feels like my heart is thumping", pt awake and alert, family at bedside, pt type 1 DM, insulin pump in place

## 2015-12-06 NOTE — Discharge Instructions (Signed)
Gastritis, Adult °Gastritis is soreness and puffiness (inflammation) of the lining of the stomach. If you do not get help, gastritis can cause bleeding and sores (ulcers) in the stomach. °HOME CARE  °· Only take medicine as told by your doctor. °· If you were given antibiotic medicines, take them as told. Finish the medicines even if you start to feel better. °· Drink enough fluids to keep your pee (urine) clear or pale yellow. °· Avoid foods and drinks that make your problems worse. Foods you may want to avoid include: °· Caffeine or alcohol. °· Chocolate. °· Mint. °· Garlic and onions. °· Spicy foods. °· Citrus fruits, including oranges, lemons, or limes. °· Food containing tomatoes, including sauce, chili, salsa, and pizza. °· Fried and fatty foods. °· Eat small meals throughout the day instead of large meals. °GET HELP RIGHT AWAY IF:  °· You have black or dark red poop (stools). °· You throw up (vomit) blood. It may look like coffee grounds. °· You cannot keep fluids down. °· Your belly (abdominal) pain gets worse. °· You have a fever. °· You do not feel better after 1 week. °· You have any other questions or concerns. °MAKE SURE YOU:  °· Understand these instructions. °· Will watch your condition. °· Will get help right away if you are not doing well or get worse. °  °This information is not intended to replace advice given to you by your health care provider. Make sure you discuss any questions you have with your health care provider. °  °Document Released: 03/07/2008 Document Revised: 12/12/2011 Document Reviewed: 11/02/2011 °Elsevier Interactive Patient Education ©2016 [employer] ° °Esophagitis °Esophagitis is inflammation of the esophagus. The esophagus is the tube that carries food and liquids from your mouth to your stomach. Esophagitis can cause soreness or pain in the esophagus. This condition can make it difficult and painful to swallow.  °CAUSES °Most causes of esophagitis are not serious. Common  causes of this condition include: °· Gastroesophageal reflux disease (GERD). This is when stomach contents move back up into the esophagus (reflux). °· Repeated vomiting. °· An allergic-type reaction, especially caused by food allergies (eosinophilic esophagitis). °· Injury to the esophagus by swallowing large pills with or without water, or swallowing certain types of medicines. °· Swallowing (ingesting) harmful chemicals, such as household cleaning products. °· Heavy alcohol use. °· An infection of the esophagus. This most often occurs in people who have a weakened immune system. °· Radiation or chemotherapy treatment for cancer. °· Certain diseases such as sarcoidosis, Crohn disease, and scleroderma. °SYMPTOMS °Symptoms of this condition include: °· Difficult or painful swallowing. °· Pain with swallowing acidic liquids, such as citrus juices. °· Pain with burping. °· Chest pain. °· Difficulty breathing. °· Nausea. °· Vomiting. °· Pain in the abdomen. °· Weight loss. °· Ulcers in the mouth. °· Patches of white material in the mouth (candidiasis). °· Fever. °· Coughing up blood or vomiting blood. °· Stool that is black, tarry, or bright red. °DIAGNOSIS °Your health care provider will take a medical history and perform a physical exam. You may also have other tests, including: °· An endoscopy to examine your stomach and esophagus with a small camera. °· A test that measures the acidity level in your esophagus. °· A test that measures how much pressure is on your esophagus. °· A barium swallow or modified barium swallow to show the shape, size, and functioning of your esophagus. °· Allergy tests. °TREATMENT °Treatment for this condition depends on the   cause of your esophagitis. In some cases, steroids or other medicines may be given to help relieve your symptoms or to treat the underlying cause of your condition. You may have to make some lifestyle changes, such as: °· Avoiding alcohol. °· Quitting  smoking. °· Changing your diet. °· Exercising. °· Changing your sleep habits and your sleep environment. °HOME CARE INSTRUCTIONS °Take these actions to decrease your discomfort and to help avoid complications. °Diet °· Follow a diet as recommended by your health care provider. This may involve avoiding foods and drinks such as: °¨ Coffee and tea (with or without caffeine). °¨ Drinks that contain alcohol. °¨ Energy drinks and sports drinks. °¨ Carbonated drinks or sodas. °¨ Chocolate and cocoa. °¨ Peppermint and mint flavorings. °¨ Garlic and onions. °¨ Horseradish. °¨ Spicy and acidic foods, including peppers, chili powder, curry powder, vinegar, hot sauces, and barbecue sauce. °¨ Citrus fruit juices and citrus fruits, such as oranges, lemons, and limes. °¨ Tomato-based foods, such as red sauce, chili, salsa, and pizza with red sauce. °¨ Fried and fatty foods, such as donuts, french fries, potato chips, and high-fat dressings. °¨ High-fat meats, such as hot dogs and fatty cuts of red and white meats, such as rib eye steak, sausage, ham, and bacon. °¨ High-fat dairy items, such as whole milk, butter, and cream cheese. °· Eat small, frequent meals instead of large meals. °· Avoid drinking large amounts of liquid with your meals. °· Avoid eating meals during the 2-3 hours before bedtime. °· Avoid lying down right after you eat. °· Do not exercise right after you eat. °· Avoid foods and drinks that seem to make your symptoms worse. °General Instructions °· Pay attention to any changes in your symptoms. °· Take over-the-counter and prescription medicines only as told by your health care provider. Do not take aspirin, ibuprofen, or other NSAIDs unless your health care provider told you to do so. °· If you have trouble taking pills, use a pill splitter to decrease the size of the pill. This will decrease the chance of the pill getting stuck or injuring your esophagus on the way down. Also, drink water after you take a  pill. °· Do not use any tobacco products, including cigarettes, chewing tobacco, and e-cigarettes. If you need help quitting, ask your health care provider. °· Wear loose-fitting clothing. Do not wear anything tight around your waist that causes pressure on your abdomen. °· Raise (elevate) the head of your bed about 6 inches (15 cm). °· Try to reduce your stress, such as with yoga or meditation. If you need help reducing stress, ask your health care provider. °· If you are overweight, reduce your weight to an amount that is healthy for you. Ask your health care provider for guidance about a safe weight loss goal. °· Keep all follow-up visits as told by your health care provider. This is important. °SEEK MEDICAL CARE IF: °· You have new symptoms. °· You have unexplained weight loss. °· You have difficulty swallowing, or it hurts to swallow. °· You have wheezing or a persistent cough. °· Your symptoms do not improve with treatment. °· You have frequent heartburn for more than two weeks. °SEEK IMMEDIATE MEDICAL CARE IF: °· You have severe pain in your arms, neck, jaw, teeth, or back. °· You feel sweaty, dizzy, or light-headed. °· You have chest pain or shortness of breath. °· You vomit and your vomit looks like blood or coffee grounds. °· Your stool is bloody or black. °·   You have a fever. °· You cannot swallow, drink, or eat. °  °This information is not intended to replace advice given to you by your health care provider. Make sure you discuss any questions you have with your health care provider. °  °Document Released: 10/27/2004 Document Revised: 06/10/2015 Document Reviewed: 01/14/2015 °Elsevier Interactive Patient Education ©2016 Elsevier Inc. ° °

## 2016-01-07 DIAGNOSIS — F988 Other specified behavioral and emotional disorders with onset usually occurring in childhood and adolescence: Secondary | ICD-10-CM | POA: Diagnosis not present

## 2016-01-20 DIAGNOSIS — J019 Acute sinusitis, unspecified: Secondary | ICD-10-CM | POA: Diagnosis not present

## 2016-01-28 DIAGNOSIS — F411 Generalized anxiety disorder: Secondary | ICD-10-CM | POA: Diagnosis not present

## 2016-02-25 DIAGNOSIS — F411 Generalized anxiety disorder: Secondary | ICD-10-CM | POA: Diagnosis not present

## 2016-03-17 DIAGNOSIS — E1065 Type 1 diabetes mellitus with hyperglycemia: Secondary | ICD-10-CM | POA: Diagnosis not present

## 2016-03-24 DIAGNOSIS — E109 Type 1 diabetes mellitus without complications: Secondary | ICD-10-CM | POA: Diagnosis not present

## 2016-03-24 DIAGNOSIS — Z9641 Presence of insulin pump (external) (internal): Secondary | ICD-10-CM | POA: Diagnosis not present

## 2016-04-14 DIAGNOSIS — F900 Attention-deficit hyperactivity disorder, predominantly inattentive type: Secondary | ICD-10-CM | POA: Diagnosis not present

## 2016-04-26 DIAGNOSIS — B9689 Other specified bacterial agents as the cause of diseases classified elsewhere: Secondary | ICD-10-CM | POA: Diagnosis not present

## 2016-04-26 DIAGNOSIS — R05 Cough: Secondary | ICD-10-CM | POA: Diagnosis not present

## 2016-04-26 DIAGNOSIS — J019 Acute sinusitis, unspecified: Secondary | ICD-10-CM | POA: Diagnosis not present

## 2016-07-07 DIAGNOSIS — Z23 Encounter for immunization: Secondary | ICD-10-CM | POA: Diagnosis not present

## 2016-08-04 DIAGNOSIS — E109 Type 1 diabetes mellitus without complications: Secondary | ICD-10-CM | POA: Diagnosis not present

## 2016-08-11 DIAGNOSIS — E1065 Type 1 diabetes mellitus with hyperglycemia: Secondary | ICD-10-CM | POA: Diagnosis not present

## 2016-08-11 DIAGNOSIS — Z9641 Presence of insulin pump (external) (internal): Secondary | ICD-10-CM | POA: Diagnosis not present

## 2016-08-19 DIAGNOSIS — F900 Attention-deficit hyperactivity disorder, predominantly inattentive type: Secondary | ICD-10-CM | POA: Diagnosis not present

## 2016-09-01 ENCOUNTER — Ambulatory Visit (INDEPENDENT_AMBULATORY_CARE_PROVIDER_SITE_OTHER): Payer: BLUE CROSS/BLUE SHIELD | Admitting: Obstetrics and Gynecology

## 2016-09-01 ENCOUNTER — Encounter: Payer: Self-pay | Admitting: Obstetrics and Gynecology

## 2016-09-01 VITALS — BP 114/74 | HR 102 | Ht 60.6 in | Wt 181.2 lb

## 2016-09-01 DIAGNOSIS — E109 Type 1 diabetes mellitus without complications: Secondary | ICD-10-CM

## 2016-09-01 DIAGNOSIS — Z975 Presence of (intrauterine) contraceptive device: Secondary | ICD-10-CM | POA: Diagnosis not present

## 2016-09-01 DIAGNOSIS — E669 Obesity, unspecified: Secondary | ICD-10-CM | POA: Insufficient documentation

## 2016-09-01 DIAGNOSIS — Z98891 History of uterine scar from previous surgery: Secondary | ICD-10-CM | POA: Diagnosis not present

## 2016-09-01 DIAGNOSIS — Z01419 Encounter for gynecological examination (general) (routine) without abnormal findings: Secondary | ICD-10-CM

## 2016-09-01 DIAGNOSIS — Z8619 Personal history of other infectious and parasitic diseases: Secondary | ICD-10-CM

## 2016-09-01 DIAGNOSIS — F909 Attention-deficit hyperactivity disorder, unspecified type: Secondary | ICD-10-CM | POA: Diagnosis not present

## 2016-09-01 DIAGNOSIS — F902 Attention-deficit hyperactivity disorder, combined type: Secondary | ICD-10-CM

## 2016-09-01 HISTORY — DX: History of uterine scar from previous surgery: Z98.891

## 2016-09-01 HISTORY — DX: Attention-deficit hyperactivity disorder, combined type: F90.2

## 2016-09-01 HISTORY — DX: Personal history of other infectious and parasitic diseases: Z86.19

## 2016-09-01 NOTE — Patient Instructions (Signed)

## 2016-09-01 NOTE — Progress Notes (Addendum)
ANNUAL PREVENTATIVE CARE GYN  ENCOUNTER NOTE  Subjective:       Jordan Leonard is a 25 y.o. 321P1001 female here for a routine annual gynecologic exam.  Current complaints: 1.  none   History of type 1 diabetes over 15 years; has used insulin pump for the past 15 years; hemoglobin A1c fluctuates significantly. Pregnancy 2 years ago was complicated by induction of labor at 37 weeks for suboptimally controlled blood sugars with subsequent need for emergency primary low cervical transverse cesarean section for nonreassuring fetal heart rate tracing.  Patient is not exercising regularly. Patient is not having any cycles due to Mirena IUD in place Bowel function and bladder function are normal. Patient is contemplating pregnancy in the next 1-2 years   Gynecologic History No LMP recorded (lmp unknown). Patient is not currently having periods (Reason: IUD). Contraception: IUD- mirena inserted 06/2014 Last Pap: 2016. Results were: normal Last mammogram: n/a.   Obstetric History OB History  Gravida Para Term Preterm AB Living  1 1 1     1   SAB TAB Ectopic Multiple Live Births          1    # Outcome Date GA Lbr Len/2nd Weight Sex Delivery Anes PTL Lv  1 Term 2015   7 lb 8 oz (3.402 kg) F CS-LTranv   LIV      Past Medical History:  Diagnosis Date  . ADHD   . Diabetes mellitus without complication (HCC)   . STD (sexually transmitted disease)    chlaymdia- 10 years ago    Past Surgical History:  Procedure Laterality Date  . CESAREAN SECTION      No current outpatient prescriptions on file prior to visit.   No current facility-administered medications on file prior to visit.     Allergies  Allergen Reactions  . Penicillins Anaphylaxis  . Amoxicillin Other (See Comments)    Social History   Social History  . Marital status: Married    Spouse name: N/A  . Number of children: N/A  . Years of education: N/A   Occupational History  . Not on file.   Social History  Main Topics  . Smoking status: Not on file  . Smokeless tobacco: Not on file  . Alcohol use Not on file  . Drug use: Unknown  . Sexual activity: Not on file   Other Topics Concern  . Not on file   Social History Narrative  . No narrative on file    No family history on file.  The following portions of the patient's history were reviewed and updated as appropriate: allergies, current medications, past family history, past medical history, past social history, past surgical history and problem list.  Review of Systems ROS Review of Systems - General ROS: negative for - chills, fatigue, fever, hot flashes, night sweats, weight gain or weight loss Psychological ROS: negative for - anxiety, decreased libido, depression, mood swings, physical abuse or sexual abuse Ophthalmic ROS: negative for - blurry vision, eye pain or loss of vision ENT ROS: negative for - headaches, hearing change, visual changes or vocal changes Allergy and Immunology ROS: negative for - hives, itchy/watery eyes or seasonal allergies Hematological and Lymphatic ROS: negative for - bleeding problems, bruising, swollen lymph nodes or weight loss Endocrine ROS: negative for - galactorrhea, hair pattern changes, hot flashes, malaise/lethargy, mood swings, palpitations, polydipsia/polyuria, skin changes, temperature intolerance or unexpected weight changes Breast ROS: negative for - new or changing breast lumps or nipple discharge  Respiratory ROS: negative for - cough or shortness of breath Cardiovascular ROS: negative for - chest pain, irregular heartbeat, palpitations or shortness of breath Gastrointestinal ROS: no abdominal pain, change in bowel habits, or black or bloody stools Genito-Urinary ROS: no dysuria, trouble voiding, or hematuria Musculoskeletal ROS: negative for - joint pain or joint stiffness Neurological ROS: negative for - bowel and bladder control changes Dermatological ROS: negative for rash and skin  lesion changes   Objective:   BP 114/74   Pulse (!) 102   Ht 5' 0.6" (1.539 m)   Wt 181 lb 3.2 oz (82.2 kg)   LMP  (LMP Unknown) Comment: mirena- inserted 06/2014  BMI 34.69 kg/m  CONSTITUTIONAL: Well-developed, well-nourished female in no acute distress.  PSYCHIATRIC: Normal mood and affect. Normal behavior. Normal judgment and thought content. NEUROLGIC: Alert and oriented to person, place, and time. Normal muscle tone coordination. No cranial nerve deficit noted. HENT:  Normocephalic, atraumatic EYES: Conjunctivae and EOM are normal. Pupils are equal, round, and reactive to light. No scleral icterus.  NECK: Normal range of motion, supple, no masses.  Normal thyroid.  SKIN: Skin is warm and dry. No rash noted. Not diaphoretic. No erythema. No pallor. CARDIOVASCULAR: Normal heart rate noted, regular rhythm, no murmur. RESPIRATORY: Clear to auscultation bilaterally. Effort and breath sounds normal, no problems with respiration noted. BREASTS: Symmetric in size. No masses, skin changes, nipple drainage, or lymphadenopathy. ABDOMEN: Soft, normal bowel sounds, no distention noted.  No tenderness, rebound or guarding.  BLADDER: Normal PELVIC:  External Genitalia: Normal  BUS: Normal  Vagina: Normal; minimal white secretions in vault  Cervix: Normal; IUD string visualized-2-1/2 cm; no cervical motion tenderness  Uterus: Normal; midplane, normal size and shape, mobile, nontender  Adnexa: Normal; nonpalpable and nontender  RV: External Exam NormaI  MUSCULOSKELETAL: Normal range of motion. No tenderness.  No cyanosis, clubbing, or edema.  2+ distal pulses. LYMPHATIC: No Axillary, Supraclavicular, or Inguinal Adenopathy.    Assessment:   Annual gynecologic examination 25 y.o. Contraception: IUD Obesity 1 Type 1 diabetes mellitus, on insulin pump with labile blood sugars History of cesarean section ADHD Amenorrhea secondary to IUD  Plan:  Pap: Pap, Reflex if ASCUS Mammogram: Not  Indicated Stool Guaiac Testing:  Not Indicated Labs: ., Lipid 1, TSH and Vit D Level"". Routine preventative health maintenance measures emphasized: Exercise/Diet/Weight control, Tobacco Warnings and Alcohol/Substance use risks  Follow-up every 3 months with Dr. Tedd SiasSolum regarding diabetes mellitus management Return to Clinic - 1 Year   Crystal EvertonMiller, CMA  Herold HarmsMartin A Shamecka Hocutt, MD  Note: This dictation was prepared with Dragon dictation along with smaller phrase technology. Any transcriptional errors that result from this process are unintentional.

## 2016-09-01 NOTE — Addendum Note (Signed)
Addended by: Sharon SellerEFRANCESCO, MARTIN on: 09/01/2016 03:22 PM   Modules accepted: Orders

## 2016-09-02 ENCOUNTER — Other Ambulatory Visit: Payer: BLUE CROSS/BLUE SHIELD

## 2016-09-02 DIAGNOSIS — Z01419 Encounter for gynecological examination (general) (routine) without abnormal findings: Secondary | ICD-10-CM | POA: Diagnosis not present

## 2016-09-03 LAB — VITAMIN D 25 HYDROXY (VIT D DEFICIENCY, FRACTURES): Vit D, 25-Hydroxy: 21.7 ng/mL — ABNORMAL LOW (ref 30.0–100.0)

## 2016-09-03 LAB — LIPID PANEL
CHOL/HDL RATIO: 2.8 ratio (ref 0.0–4.4)
Cholesterol, Total: 124 mg/dL (ref 100–199)
HDL: 44 mg/dL (ref >39)
LDL CALC: 70 mg/dL (ref 0–99)
Triglycerides: 48 mg/dL (ref 0–149)
VLDL CHOLESTEROL CAL: 10 mg/dL (ref 5–40)

## 2016-09-03 LAB — TSH: TSH: 2.22 u[IU]/mL (ref 0.450–4.500)

## 2016-09-06 LAB — PAP IG W/ RFLX HPV ASCU: PAP SMEAR COMMENT: 0

## 2016-09-20 DIAGNOSIS — Z0184 Encounter for antibody response examination: Secondary | ICD-10-CM | POA: Diagnosis not present

## 2016-10-11 DIAGNOSIS — F900 Attention-deficit hyperactivity disorder, predominantly inattentive type: Secondary | ICD-10-CM | POA: Diagnosis not present

## 2016-10-11 DIAGNOSIS — Z79899 Other long term (current) drug therapy: Secondary | ICD-10-CM | POA: Diagnosis not present

## 2016-12-28 DIAGNOSIS — F900 Attention-deficit hyperactivity disorder, predominantly inattentive type: Secondary | ICD-10-CM | POA: Diagnosis not present

## 2017-02-17 ENCOUNTER — Ambulatory Visit (INDEPENDENT_AMBULATORY_CARE_PROVIDER_SITE_OTHER): Payer: BLUE CROSS/BLUE SHIELD | Admitting: Endocrinology

## 2017-02-17 ENCOUNTER — Encounter: Payer: Self-pay | Admitting: Endocrinology

## 2017-02-17 VITALS — BP 122/74 | HR 118 | Wt 171.8 lb

## 2017-02-17 DIAGNOSIS — E109 Type 1 diabetes mellitus without complications: Secondary | ICD-10-CM | POA: Diagnosis not present

## 2017-02-17 LAB — POCT GLYCOSYLATED HEMOGLOBIN (HGB A1C): HEMOGLOBIN A1C: 8.5

## 2017-02-17 MED ORDER — "PARADIGM QUICK-SET 43"" 9MM MISC": 1.0000 | 3 refills | Status: DC

## 2017-02-17 MED ORDER — PARADIGM PUMP RESERVOIR 1.8ML MISC: 1.0000 | 3 refills | Status: DC

## 2017-02-17 MED ORDER — GLUCOSE BLOOD VI STRP
ORAL_STRIP | 3 refills | Status: DC
Start: 2017-02-17 — End: 2017-04-17

## 2017-02-17 NOTE — Progress Notes (Signed)
Subjective:    Patient ID: Jordan Leonard, female    DOB: [birth year], 26 y.o.   MRN: [id number]  HPI pt is self-referred, for diabetes.  Pt states DM was dx'ed in 2000; she has mild if any neuropathy of the lower extremities; she is unaware of any associated chronic complications; she has been on insulin since dx, and pump rx since 2005; pt says her diet and exercise are not good; she has never had GDM, pancreatitis, pancreatic surgery, severe hypoglycemia or DKA.  She has a medtronic pump (current one since 2013). She takes these settings:  basal rate of MN: 1.5units/hr; 5 AM: 1.7 units/hr; 11 AM: 1.3 units/hr. mealtime bolus of 1 unit/ 8 grams carbohydrate (12 MN).  1 unit 6 grams (6 PM).   correction bolus (which some people call "sensitivity," or "insulin sensitivity ratio," or just "isr") of 1 unit for each by which your glucose exceeds 100. 12 MN: 35  12 noon: 25. She has mild hypoglycemia 2-3 times per month, due to overestimating CHO.  She takes a total of approx 45 units/day.   Past Medical History:  Diagnosis Date  . ADHD   . Diabetes mellitus without complication (HCC)   . STD (sexually transmitted disease)    chlaymdia- 10 years ago    Past Surgical History:  Procedure Laterality Date  . CESAREAN SECTION      Social History   Social History  . Marital status: Married    Spouse name: N/A  . Number of children: N/A  . Years of education: N/A   Occupational History  . Not on file.   Social History Main Topics  . Smoking status: Never Smoker  . Smokeless tobacco: Never Used  . Alcohol use Yes     Comment: rare  . Drug use: No  . Sexual activity: Yes    Birth control/ protection: IUD   Other Topics Concern  . Not on file   Social History Narrative  . No narrative on file    Current Outpatient Prescriptions on File Prior to Visit  Medication Sig Dispense Refill  . glucagon (GLUCAGON EMERGENCY) 1 MG injection 1 mg.    . insulin aspart (NOVOLOG) 100  UNIT/ML injection Use via insulin pump.  Max of 80 units per day.    . lisdexamfetamine (VYVANSE) 40 MG capsule Take 40 mg by mouth every morning.     No current facility-administered medications on file prior to visit.     Allergies  Allergen Reactions  . Penicillins Anaphylaxis  . Amoxicillin Other (See Comments)    Family History  Problem Relation Age of Onset  . Breast cancer Neg Hx   . Ovarian cancer Neg Hx   . Colon cancer Neg Hx   . Diabetes Neg Hx   . Heart disease Neg Hx   . Diabetes gravidarum Neg Hx   . Diabetes type I Neg Hx    BP 122/74 (BP Location: Left Arm, Patient Position: Sitting)   Pulse (!) 118   Wt 171 lb 12.8 oz (77.9 kg)   SpO2 98%   BMI 32.89 kg/m   Review of Systems denies blurry vision, headache, chest pain, sob, n/v, urinary frequency, muscle cramps, excessive diaphoresis, depression, cold intolerance, rhinorrhea, and easy bruising.  She has lost af few lbs--she attributes to vyvanse.      Objective:   Physical Exam VS: see vs page GEN: no distress HEAD: head: no deformity eyes: no periorbital swelling, no proptosis external nose and  ears are normal mouth: no lesion seen NECK: supple, thyroid is not enlarged CHEST WALL: no deformity LUNGS: clear to auscultation CV: reg rate and rhythm, no murmur ABD: abdomen is soft, nontender.  no hepatosplenomegaly.  not distended.  no hernia MUSCULOSKELETAL: muscle bulk and strength are grossly normal.  no obvious joint swelling.  gait is normal and steady EXTEMITIES: no deformity.  no ulcer on the feet.  feet are of normal color and temp.  no edema PULSES: dorsalis pedis intact bilat.  no carotid bruit NEURO:  cn 2-12 grossly intact.   readily moves all 4's.  sensation is intact to touch on the feet SKIN:  Normal texture and temperature.  No rash or suspicious lesion is visible.   NODES:  None palpable at the neck PSYCH: alert, well-oriented.  Does not appear anxious nor depressed.   I have reviewed  outside records, and summarized: Pt was seen for well-woman visit.  She was noted to have pregnancy in 2015, complicated by poorly-controlled DM.  A1c=8.5%    Assessment & Plan:  Insulin-requiring type 2 DM: she needs increased rx.     Patient Instructions  good diet and exercise significantly improve the control of your diabetes.  please let me know if you wish to be referred to a dietician.  high blood sugar is very risky to your health.  you should see an eye doctor and dentist every year.  It is very important to get all recommended vaccinations.  Controlling your blood pressure and cholesterol drastically reduces the damage diabetes does to your body.  Those who smoke should quit.  Please discuss these with your doctor.  At our office, we are fortunate to have two specialists who are happy to help you:   Cristy Folks, RN, CDE, is a diabetes educator and pump trainer.  She is here on Monday mornings, and all day Tuesday and Wednesday.  She is can help you with low blood sugar avoidance and treatment, injecting insulin, sick day management, and others.   Oran Rein, RD is our dietician.  She is here all day Thursday and Friday.  She can advise you about a healthy diet.  She can also help you about a variety of special diabetes situations, such as shift work, Animal nutritionist, gluten-free, diet for kidney patients, traveling with diabetes, and help for those who need to gain weight.   check your blood sugar 8 times a day.  vary the time of day when you check, between before the 3 meals, and at bedtime.  also check if you have symptoms of your blood sugar being too high or too low.  please keep a record of the readings and bring it to your next appointment here (or you can bring the meter itself).  You can write it on any piece of paper.  please call us sooner if your blood sugar goes below 70, or if you have a lot of readings over 200.  Please take these settings: basal rate of MN: 1.5units/hr; 5  AM: 1.7 units/hr; 11 AM: 1.3 units/hr mealtime bolus of 1 unit/ 8 grams carbohydrate at all times of day.  correction bolus (which some people call "sensitivity," or "insulin sensitivity ratio," or just "isr") of 1 unit for each by which your glucose exceeds 100. 12 MN: 35  12 noon: 25.   Please let us know if you want to get a continuous glucose monitor.  Please come back for a follow-up appointment in 2 months.   Please  call us in 1-2 weeks, to tell us how the blood sugar is doing.

## 2017-02-17 NOTE — Patient Instructions (Addendum)
good diet and exercise significantly improve the control of your diabetes.  please let me know if you wish to be referred to a dietician.  high blood sugar is very risky to your health.  you should see an eye doctor and dentist every year.  It is very important to get all recommended vaccinations.  Controlling your blood pressure and cholesterol drastically reduces the damage diabetes does to your body.  Those who smoke should quit.  Please discuss these with your doctor.  At our office, we are fortunate to have two specialists who are happy to help you:   Cristy FolksLinda Spagnola, RN, CDE, is a diabetes educator and pump trainer.  She is here on Monday mornings, and all day Tuesday and Wednesday.  She is can help you with low blood sugar avoidance and treatment, injecting insulin, sick day management, and others.   Oran ReinLaura Jobe, RD is our dietician.  She is here all day Thursday and Friday.  She can advise you about a healthy diet.  She can also help you about a variety of special diabetes situations, such as shift work, Animal nutritionistvegeterian diet, gluten-free, diet for kidney patients, traveling with diabetes, and help for those who need to gain weight.   check your blood sugar 8 times a day.  vary the time of day when you check, between before the 3 meals, and at bedtime.  also check if you have symptoms of your blood sugar being too high or too low.  please keep a record of the readings and bring it to your next appointment here (or you can bring the meter itself).  You can write it on any piece of paper.  please call us sooner if your blood sugar goes below 70, or if you have a lot of readings over 200.  Please take these settings: basal rate of MN: 1.5units/hr; 5 AM: 1.7 units/hr; 11 AM: 1.3 units/hr mealtime bolus of 1 unit/ 8 grams carbohydrate at all times of day.  correction bolus (which some people call "sensitivity," or "insulin sensitivity ratio," or just "isr") of 1 unit for each by which your glucose exceeds 100.  12 MN: 35  12 noon: 25.   Please let us know if you want to get a continuous glucose monitor.  Please come back for a follow-up appointment in 2 months.   Please call us in 1-2 weeks, to tell us how the blood sugar is doing.

## 2017-02-20 ENCOUNTER — Encounter: Payer: Self-pay | Admitting: Endocrinology

## 2017-02-20 ENCOUNTER — Telehealth: Payer: Self-pay | Admitting: Obstetrics and Gynecology

## 2017-02-20 NOTE — Telephone Encounter (Signed)
Pt states her left breast has a golf ball size marble that is very painful x5 days. NO fever, nipple d/c or red streaks. Pt had a mass in 2011 on right. Had surgery consult with neb bx. Given atb with relief of sx. Appt scheduled for 5/22 at 12:45. Advised pt to take ibup 800 until seen. If pain gets worse to contact office asap.

## 2017-02-20 NOTE — Telephone Encounter (Signed)
Patient called concerned about a mass that is on right breast, Patient disclosed the same occurrence has happened with the left breast a couple of years ago. The patient scheduled an appointment, to be seen and get a mammogram as soon as possible. Patient also disclosed that the right mass in her breast is very painful and uncomfortable. Please advise.

## 2017-02-21 ENCOUNTER — Ambulatory Visit (INDEPENDENT_AMBULATORY_CARE_PROVIDER_SITE_OTHER): Payer: BLUE CROSS/BLUE SHIELD | Admitting: Obstetrics and Gynecology

## 2017-02-21 ENCOUNTER — Other Ambulatory Visit: Payer: Self-pay

## 2017-02-21 ENCOUNTER — Encounter: Payer: Self-pay | Admitting: Obstetrics and Gynecology

## 2017-02-21 VITALS — BP 117/81 | HR 93 | Ht 60.5 in | Wt 172.4 lb

## 2017-02-21 DIAGNOSIS — N6310 Unspecified lump in the right breast, unspecified quadrant: Secondary | ICD-10-CM

## 2017-02-21 DIAGNOSIS — H52203 Unspecified astigmatism, bilateral: Secondary | ICD-10-CM | POA: Diagnosis not present

## 2017-02-21 LAB — HM DIABETES EYE EXAM

## 2017-02-21 MED ORDER — INSULIN ASPART 100 UNIT/ML ~~LOC~~ SOLN: SUBCUTANEOUS | 2 refills | Status: DC

## 2017-02-21 MED ORDER — SULFAMETHOXAZOLE-TRIMETHOPRIM 800-160 MG PO TABS: 1.0000 | ORAL_TABLET | Freq: Two times a day (BID) | ORAL | 0 refills | Status: DC

## 2017-02-21 NOTE — Patient Instructions (Signed)
1. Ultrasound of right breast and diagnostic mammogram as needed 2. Septra DS twice a day for 14 days 3. Return in 2 weeks for follow-up exam 4. Tylenol or ibuprofen may be taken as needed for pain

## 2017-02-21 NOTE — Progress Notes (Signed)
Chief complaint: 1. Right breast mass 2. Breast tenderness  26 year old female with three-day history of right breast tenderness and mass, golf ball size which developed simultaneously. No fever or chills or sweats. No history of breast trauma. Not lactating. No family history of breast cancer.  Patient had similar episode of a painful breast mass 6 years ago, with negative workup and subsequent resolution following antibiotic therapy.  Past Medical History:  Diagnosis Date  . ADHD   . Diabetes mellitus without complication (HCC)   . STD (sexually transmitted disease)    chlaymdia- 10 years ago   Past Surgical History:  Procedure Laterality Date  . CESAREAN SECTION      Review of systems: As noted in history of present illness  OBJECTIVE: BP 117/81   Pulse 93   Ht 5' 0.5" (1.537 m)   Wt 172 lb 6.4 oz (78.2 kg)   LMP  (LMP Unknown)   BMI 33.12 kg/m  Pleasant female in no acute distress. Alert and oriented. Neck: No adenopathy or thyromegaly Breasts: Bilaterally symmetric; slight hyperemia noted in the 1 to 3:00 position of the right breast without significant heat; breast tissue in the upper inner quadrant of the right breast is slightly full 1 compared to remainder of breasts. No obvious palpable mass. Left breast is unremarkable. Lymph nodes survey: No adenopathy in the supraclavicular or axillary regions.  ASSESSMENT: 1. Tender breast mass with hyperemia of skin in upper inner quadrant on right side  PLAN: 1. Breast ultrasound 2. Diagnostic mammogram if needed 3. Septra DS twice a day for 14 days 4. Return in 2 weeks for follow-up  A total of 15 minutes were spent face-to-face with the patient during this encounter and over half of that time dealt with counseling and coordination of care.  Herold HarmsMartin A Aamir Mclinden, MD  Note: This dictation was prepared with Dragon dictation along with smaller phrase technology. Any transcriptional errors that result from this process are  unintentional.

## 2017-02-22 DIAGNOSIS — F900 Attention-deficit hyperactivity disorder, predominantly inattentive type: Secondary | ICD-10-CM | POA: Diagnosis not present

## 2017-02-28 ENCOUNTER — Ambulatory Visit
Admission: RE | Admit: 2017-02-28 | Discharge: 2017-02-28 | Disposition: A | Discharge status: Home / Self Care | Payer: BLUE CROSS/BLUE SHIELD | Source: Ambulatory Visit | Attending: Obstetrics and Gynecology | Admitting: Obstetrics and Gynecology

## 2017-02-28 DIAGNOSIS — N6312 Unspecified lump in the right breast, upper inner quadrant: Secondary | ICD-10-CM | POA: Diagnosis not present

## 2017-02-28 DIAGNOSIS — N6489 Other specified disorders of breast: Secondary | ICD-10-CM | POA: Diagnosis not present

## 2017-02-28 DIAGNOSIS — N6310 Unspecified lump in the right breast, unspecified quadrant: Secondary | ICD-10-CM

## 2017-03-07 ENCOUNTER — Encounter: Payer: Self-pay | Admitting: Obstetrics and Gynecology

## 2017-03-07 ENCOUNTER — Ambulatory Visit (INDEPENDENT_AMBULATORY_CARE_PROVIDER_SITE_OTHER): Payer: BLUE CROSS/BLUE SHIELD | Admitting: Obstetrics and Gynecology

## 2017-03-07 VITALS — BP 117/77 | HR 113 | Ht 60.6 in | Wt 170.6 lb

## 2017-03-07 DIAGNOSIS — N6001 Solitary cyst of right breast: Secondary | ICD-10-CM | POA: Diagnosis not present

## 2017-03-07 NOTE — Patient Instructions (Signed)
1. Return in 3 months for follow-up breast exam and possible cyst aspiration

## 2017-03-07 NOTE — Progress Notes (Signed)
Chief complaint: 1. Follow-up on mastitis 2. Right breast cyst  Patient presents for follow-up exam after treatment is started for suspected mastitis. She completed 14 days of antibiotics. Recent breast ultrasound demonstrated simple cyst with a right breast measuring approximately 2 cm at the 2:00 position 1 cm from the areola. Patient does report some tenderness with active manipulation of the breast in this area.  02/28/2017 breast ultrasound EXAM: ULTRASOUND OF THE RIGHT BREAST  COMPARISON:  None.  FINDINGS: Ultrasound of the right breast at 2 o'clock, 1 cm from the nipple demonstrates a circumscribed near-anechoic oval mass measuring 1.1 x 0.5 x 1.1 cm. There is no internal blood flow on color Doppler imaging.  IMPRESSION: The mass in the right breast is consistent with a benign cyst.  RECOMMENDATION: Clinical follow-up is recommended. If the mass increases in size or becomes more painful, particularly after antibiotics have been completed, imaging follow-up with a possible aspiration is recommended. Otherwise, if the patient is improving and the mass continues to decrease in size, no further imaging follow-up is Necessary.  Past medical history, past surgical history, problem list, medications, and allergies are reviewed  OBJECTIVE: BP 117/77   Pulse (!) 113   Ht 5' 0.6" (1.539 m)   Wt 170 lb 9.6 oz (77.4 kg)   LMP  (LMP Unknown)   BMI 32.66 kg/m  Pleasant female in no acute distress. Alert and oriented. Neck: Supple without thyromegaly or adenopathy Breasts: Bilaterally symmetric without dominant mass, adenopathy, or nipple discharge. There is a distinct 2 cm mass at the 2:00 position in the right breast just superior to the areola that is 1/4 tender. No surrounding erythema or induration is noted  ASSESSMENT: 1. Right breast cyst, simple 2. Status post 2 weeks of antibiotics for suspected mastitis  PLAN: 1. Monitor breast cyst over the next 3 cycles 2. If  the cyst is persistent and symptomatic, we will consider aspiration in the office at next visit. 3. Return in 3 months for follow-up  A total of 15 minutes were spent face-to-face with the patient during this encounter and over half of that time dealt with counseling and coordination of care.  Herold HarmsMartin A Defrancesco, MD  Note: This dictation was prepared with Dragon dictation along with smaller phrase technology. Any transcriptional errors that result from this process are unintentional.

## 2017-03-22 DIAGNOSIS — F900 Attention-deficit hyperactivity disorder, predominantly inattentive type: Secondary | ICD-10-CM | POA: Diagnosis not present

## 2017-04-13 ENCOUNTER — Encounter: Payer: Self-pay | Admitting: Endocrinology

## 2017-04-17 ENCOUNTER — Other Ambulatory Visit: Payer: Self-pay

## 2017-04-17 MED ORDER — GLUCOSE BLOOD VI STRP: ORAL_STRIP | 3 refills | Status: DC

## 2017-04-19 ENCOUNTER — Encounter: Payer: Self-pay | Admitting: Endocrinology

## 2017-04-19 ENCOUNTER — Ambulatory Visit (INDEPENDENT_AMBULATORY_CARE_PROVIDER_SITE_OTHER): Payer: BLUE CROSS/BLUE SHIELD | Admitting: Endocrinology

## 2017-04-19 VITALS — BP 130/80 | HR 105 | Wt 173.2 lb

## 2017-04-19 DIAGNOSIS — E109 Type 1 diabetes mellitus without complications: Secondary | ICD-10-CM | POA: Diagnosis not present

## 2017-04-19 LAB — POCT GLYCOSYLATED HEMOGLOBIN (HGB A1C): Hemoglobin A1C: 7.2

## 2017-04-19 NOTE — Patient Instructions (Addendum)
check your blood sugar 8 times a day.  vary the time of day when you check, between before the 3 meals, and at bedtime.  also check if you have symptoms of your blood sugar being too high or too low.  please keep a record of the readings and bring it to your next appointment here (or you can bring the meter itself).  You can write it on any piece of paper.  please call us sooner if your blood sugar goes below 70, or if you have a lot of readings over 200.  Please take these settings: basal rate of 1.1 units/hr, except for 1.5 units/hr, 3 am to 6 am.  mealtime bolus of 1 unit/10 grams carbohydrate at all times of day.  correction bolus (which some people call "sensitivity," or "insulin sensitivity ratio," or just "isr") of 1 unit for each 45by which your glucose exceeds.   Please come back for a follow-up appointment in 2 months.

## 2017-04-19 NOTE — Progress Notes (Signed)
Subjective:    Patient ID: Jordan Leonard, female    DOB: 1991/04/11, 26 y.o.   MRN: [id number]  HPI  Pt returns for f/u of diabetes mellitus: DM type: Insulin-requiring type 2. Dx'ed: 2000 Complications: none Therapy: insulin since dx, and pump rx since 2005 GDM: never DKA: never Severe hypoglycemia: never Pancreatitis: never Pancreatic imaging:  Other: she has a medtronic pump (current one since 2013); she says she cannot afford continuous glucose monitor. Interval history: Despite no change in her weight, she says her diet is much better.  She has reduced her pump settings to: basal rate of 1.25 units/hr units/hr, except for 1.3 units/hr, 9 PM-5 AM mealtime bolus of 1 unit/10 grams carbohydrate.  correction bolus (which some people call "sensitivity," or "insulin sensitivity ratio," or just "isr") of 1 unit for each by 45 which your glucose exceeds 100, at all times of day.  She says she cannot afford continuous glucose monitor.   no cbg record, but states cbg's are mildly low approx QOD.  This happens after prolonged fasting, which is part of her diet.  However, she says cbg is higher fasting than at HS, even if she does not eat at HS.  She says she now averages 30-35 total units per day.   Past Medical History:  Diagnosis Date  . ADHD   . Anxiety   . Diabetes mellitus without complication (HCC)   . STD (sexually transmitted disease)    chlaymdia- 10 years ago    Past Surgical History:  Procedure Laterality Date  . CESAREAN SECTION      Social History   Social History  . Marital status: Married    Spouse name: N/A  . Number of children: N/A  . Years of education: N/A   Occupational History  . Not on file.   Social History Main Topics  . Smoking status: Never Smoker  . Smokeless tobacco: Never Used  . Alcohol use Yes     Comment: rare  . Drug use: No  . Sexual activity: Yes    Birth control/ protection: IUD   Other Topics Concern  . Not on file    Social History Narrative  . No narrative on file    Current Outpatient Prescriptions on File Prior to Visit  Medication Sig Dispense Refill  . glucagon (GLUCAGON EMERGENCY) 1 MG injection 1 mg.    Marland Kitchen glucose blood (BAYER CONTOUR NEXT TEST) test strip CHECK BLOOD SUGARS 8 X A DAY, and lancets 8/day 900 each 3  . hydrOXYzine (ATARAX/VISTARIL) 10 MG tablet Take 10 mg by mouth every 6 (six) hours as needed.    . insulin aspart (NOVOLOG) 100 UNIT/ML injection Use via insulin pump.  Max of 80 units per day. 20 mL 2  . Insulin Infusion Pump Supplies (MINIMED INFUSION SET-MMT 396) MISC 1 Device by Does not apply route every 3 (three) days. 30 each 3  . Insulin Infusion Pump Supplies (PARADIGM PUMP RESERVOIR 1.8ML) MISC 1 Device by Does not apply route every 3 (three) days. 30 each 3  . lisdexamfetamine (VYVANSE) 40 MG capsule Take 40 mg by mouth every morning.    Marland Kitchen dextroamphetamine (DEXTROSTAT) 10 MG tablet Take 10 mg by mouth daily.     No current facility-administered medications on file prior to visit.     Allergies  Allergen Reactions  . Penicillins Anaphylaxis  . Amoxicillin Other (See Comments)    Family History  Problem Relation Age of Onset  . Breast cancer Neg  Hx   . Ovarian cancer Neg Hx   . Colon cancer Neg Hx   . Diabetes Neg Hx   . Heart disease Neg Hx   . Diabetes gravidarum Neg Hx   . Diabetes type I Neg Hx     BP 130/80   Pulse (!) 105   Wt 173 lb 3.2 oz (78.6 kg)   SpO2 97%   BMI 33.16 kg/m   Review of Systems Denies LOC    Objective:   Physical Exam VITAL SIGNS:  See vs page GENERAL: no distress Pulses: foot pulses are intact bilaterally.   MSK: no deformity of the feet or ankles.  CV: no edema of the legs or ankles Skin:  no ulcer on the feet or ankles.  normal color and temp on the feet and ankles Neuro: sensation is intact to touch on the feet and ankles.    Lab Results  Component Value Date   HGBA1C 7.2 04/19/2017      Assessment & Plan:   Insulin-requiring type 2 DM: Based on the pattern of her cbg's, she needs some adjustment in her therapy.    Patient Instructions  check your blood sugar 8 times a day.  vary the time of day when you check, between before the 3 meals, and at bedtime.  also check if you have symptoms of your blood sugar being too high or too low.  please keep a record of the readings and bring it to your next appointment here (or you can bring the meter itself).  You can write it on any piece of paper.  please call us sooner if your blood sugar goes below 70, or if you have a lot of readings over 200.  Please take these settings: basal rate of 1.1 units/hr, except for 1.5 units/hr, 3 am to 6 am.  mealtime bolus of 1 unit/10 grams carbohydrate at all times of day.  correction bolus (which some people call "sensitivity," or "insulin sensitivity ratio," or just "isr") of 1 unit for each 45by which your glucose exceeds.   Please come back for a follow-up appointment in 2 months.

## 2017-06-02 DIAGNOSIS — F900 Attention-deficit hyperactivity disorder, predominantly inattentive type: Secondary | ICD-10-CM | POA: Diagnosis not present

## 2017-06-07 ENCOUNTER — Other Ambulatory Visit: Payer: Self-pay | Admitting: Endocrinology

## 2017-06-08 ENCOUNTER — Encounter: Payer: BLUE CROSS/BLUE SHIELD | Admitting: Obstetrics and Gynecology

## 2017-06-20 ENCOUNTER — Encounter: Payer: Self-pay | Admitting: Endocrinology

## 2017-06-20 ENCOUNTER — Ambulatory Visit (INDEPENDENT_AMBULATORY_CARE_PROVIDER_SITE_OTHER): Payer: BLUE CROSS/BLUE SHIELD | Admitting: Endocrinology

## 2017-06-20 VITALS — BP 104/76 | HR 83 | Ht 60.0 in | Wt 168.0 lb

## 2017-06-20 DIAGNOSIS — E109 Type 1 diabetes mellitus without complications: Secondary | ICD-10-CM | POA: Diagnosis not present

## 2017-06-20 LAB — POCT GLYCOSYLATED HEMOGLOBIN (HGB A1C): HEMOGLOBIN A1C: 7

## 2017-06-20 NOTE — Progress Notes (Signed)
Subjective:    Patient ID: Jordan Leonard, female    DOB: [birth year], 26 y.o.   MRN: [id number]  HPI Pt returns for f/u of diabetes mellitus: DM type: Insulin-requiring type 2. Dx'ed: 2000 Complications: none Therapy: insulin since dx, and pump rx since 2005 (medtronic--current pump since 2013) GDM: never.  DKA: only once, at dx Severe hypoglycemia: never Pancreatitis: never Pancreatic imaging: never.  Other: she has a medtronic pump (current one since 2013); she says she cannot afford continuous glucose monitor. Interval history: Despite no change in her weight, she says her diet is much better.  She has reduced her pump settings to: basal rate of 1.1units/hr units/hr, except for 1.5 units/hr, 3AM-6AM mealtime bolus of 1 unit/10 grams carbohydrate.  correction bolus (which some people call "sensitivity," or "insulin sensitivity ratio," or just "isr") of 1 unit for each by 45 which your glucose exceeds 100, at all times of day.  She says she cannot afford continuous glucose monitor.   She averages approx 35 total units per day.  Meter is downloaded today, and the printout is scanned into the record.  It varies from 50-300, but most are in the 100's.  It is in general higher as the day goes on, but there is not much of a trend.  Past Medical History:  Diagnosis Date  . ADHD   . Anxiety   . Diabetes mellitus without complication (HCC)   . STD (sexually transmitted disease)    chlaymdia- 10 years ago    Past Surgical History:  Procedure Laterality Date  . CESAREAN SECTION      Social History   Social History  . Marital status: Married    Spouse name: N/A  . Number of children: N/A  . Years of education: N/A   Occupational History  . Not on file.   Social History Main Topics  . Smoking status: Never Smoker  . Smokeless tobacco: Never Used  . Alcohol use Yes     Comment: rare  . Drug use: No  . Sexual activity: Yes    Birth control/ protection: IUD   Other  Topics Concern  . Not on file   Social History Narrative  . No narrative on file    Current Outpatient Prescriptions on File Prior to Visit  Medication Sig Dispense Refill  . dextroamphetamine (DEXTROSTAT) 10 MG tablet Take 10 mg by mouth daily.    Marland Kitchen glucagon (GLUCAGON EMERGENCY) 1 MG injection 1 mg.    Marland Kitchen glucose blood (BAYER CONTOUR NEXT TEST) test strip CHECK BLOOD SUGARS 8 X A DAY, and lancets 8/day 900 each 3  . hydrOXYzine (ATARAX/VISTARIL) 10 MG tablet Take 10 mg by mouth every 6 (six) hours as needed.    . Insulin Infusion Pump Supplies (MINIMED INFUSION SET-MMT 396) MISC 1 Device by Does not apply route every 3 (three) days. 30 each 3  . Insulin Infusion Pump Supplies (PARADIGM PUMP RESERVOIR 1.8ML) MISC 1 Device by Does not apply route every 3 (three) days. 30 each 3  . lisdexamfetamine (VYVANSE) 40 MG capsule Take 40 mg by mouth every morning.    Marland Kitchen NOVOLOG 100 UNIT/ML injection USE VIA INSULIN PUMP. MAX OF 80 UNITS PER DAY. 20 mL 2   No current facility-administered medications on file prior to visit.     Allergies  Allergen Reactions  . Penicillins Anaphylaxis  . Amoxicillin Other (See Comments)    Family History  Problem Relation Age of Onset  . Breast cancer Neg Hx   .  Ovarian cancer Neg Hx   . Colon cancer Neg Hx   . Diabetes Neg Hx   . Heart disease Neg Hx   . Diabetes gravidarum Neg Hx   . Diabetes type I Neg Hx     BP 104/76   Pulse 83   Ht 5' (1.524 m)   Wt 168 lb (76.2 kg)   SpO2 90%   BMI 32.81 kg/m   Review of Systems She has lost 5 lbs since last ov    Objective:   Physical Exam VITAL SIGNS:  See vs page GENERAL: no distress Pulses: foot pulses are intact bilaterally.   MSK: no deformity of the feet or ankles.  CV: no edema of the legs or ankles Skin:  no ulcer on the feet or ankles.  normal color and temp on the feet and ankles Neuro: sensation is intact to touch on the feet and ankles.    Lab Results  Component Value Date   HGBA1C  7.0 06/20/2017      Assessment & Plan:  Insulin-requiring type 2 DM: Based on the pattern of her cbg's, she needs some adjustment in her therapy.  Weight loss: this helps.  Pt is advised to continue her efforts  Patient Instructions  check your blood sugar 8 times a day.  vary the time of day when you check, between before the 3 meals, and at bedtime.  also check if you have symptoms of your blood sugar being too high or too low.  please keep a record of the readings and bring it to your next appointment here (or you can bring the meter itself).  You can write it on any piece of paper.  please call us sooner if your blood sugar goes below 70, or if you have a lot of readings over 200.  Please take these settings: basal rate of 1.1 units/hr, except for 1.4 units/hr, 3 am to 6 am.   mealtime bolus of 1 unit/10 grams carbohydrate at all times of day.  correction bolus (which some people call "sensitivity," or "insulin sensitivity ratio," or just "isr") of 1 unit for each 45 by which your glucose exceeds 100.   Please come back for a follow-up appointment in 4 months.

## 2017-06-20 NOTE — Patient Instructions (Addendum)
check your blood sugar 8 times a day.  vary the time of day when you check, between before the 3 meals, and at bedtime.  also check if you have symptoms of your blood sugar being too high or too low.  please keep a record of the readings and bring it to your next appointment here (or you can bring the meter itself).  You can write it on any piece of paper.  please call us sooner if your blood sugar goes below 70, or if you have a lot of readings over 200.  Please take these settings: basal rate of 1.1 units/hr, except for 1.4 units/hr, 3 am to 6 am.   mealtime bolus of 1 unit/10 grams carbohydrate at all times of day.  correction bolus (which some people call "sensitivity," or "insulin sensitivity ratio," or just "isr") of 1 unit for each 45 by which your glucose exceeds 100.   Please come back for a follow-up appointment in 4 months.

## 2017-07-06 DIAGNOSIS — Z23 Encounter for immunization: Secondary | ICD-10-CM | POA: Diagnosis not present

## 2017-07-11 ENCOUNTER — Other Ambulatory Visit: Payer: Self-pay | Admitting: Endocrinology

## 2017-07-14 DIAGNOSIS — F900 Attention-deficit hyperactivity disorder, predominantly inattentive type: Secondary | ICD-10-CM | POA: Diagnosis not present

## 2017-07-21 ENCOUNTER — Other Ambulatory Visit: Payer: Self-pay

## 2017-07-21 MED ORDER — GLUCOSE BLOOD VI STRP: ORAL_STRIP | 3 refills | Status: DC

## 2017-07-31 DIAGNOSIS — F900 Attention-deficit hyperactivity disorder, predominantly inattentive type: Secondary | ICD-10-CM | POA: Diagnosis not present

## 2017-09-01 NOTE — Progress Notes (Signed)
ANNUAL PREVENTATIVE CARE GYN  ENCOUNTER NOTE  Subjective:       Jordan Leonard is a 26 y.o. [id number] female here for a routine annual gynecologic exam.  Current complaints: 1.  none   History of type 1 diabetes over 15 years; has used insulin pump for the past 15 years; hemoglobin A1c fluctuates significantly.  Last hemoglobin A1c is 7; endocrinologist has cut her basal insulin rate in half recently.   Patient is not exercising regularly. Patient is having spotting only due to Mirena IUD (06/2014) in place Bowel function and bladder function are normal. Patient is contemplating pregnancy in the next 1-2 years   Gynecologic History No LMP recorded. Patient is not currently having periods (Reason: IUD). Contraception: IUD- mirena inserted 06/2014 Last Pap: 09/02/2016 neg. Results were: normal Last mammogram: n/a.   Obstetric History OB History  Gravida Para Term Preterm AB Living  1 1 1     1   SAB TAB Ectopic Multiple Live Births          1    # Outcome Date GA Lbr Len/2nd Weight Sex Delivery Anes PTL Lv  1 Term 2015   7 lb 8 oz (3.402 kg) F CS-LTranv   LIV      Past Medical History:  Diagnosis Date  . ADHD   . Anxiety   . Diabetes mellitus without complication (HCC)   . STD (sexually transmitted disease)    chlaymdia- 10 years ago    Past Surgical History:  Procedure Laterality Date  . CESAREAN SECTION      Current Outpatient Medications on File Prior to Visit  Medication Sig Dispense Refill  . CONTOUR NEXT TEST test strip CHECK BLOOD SUGARS 8 X A DAY, AND LANCETS 8/DAY 300 each 3  . dextroamphetamine (DEXTROSTAT) 10 MG tablet Take 10 mg by mouth daily.    Marland Kitchen. glucagon (GLUCAGON EMERGENCY) 1 MG injection 1 mg.    Marland Kitchen. glucose blood (BAYER CONTOUR NEXT TEST) test strip CHECK BLOOD SUGARS 8 X A DAY, and lancets 8/day 900 each 3  . hydrOXYzine (ATARAX/VISTARIL) 10 MG tablet Take 10 mg by mouth every 6 (six) hours as needed.    . Insulin Infusion Pump Supplies (MINIMED  INFUSION SET-MMT 396) MISC 1 Device by Does not apply route every 3 (three) days. 30 each 3  . Insulin Infusion Pump Supplies (PARADIGM PUMP RESERVOIR 1.8ML) MISC 1 Device by Does not apply route every 3 (three) days. 30 each 3  . lisdexamfetamine (VYVANSE) 40 MG capsule Take 40 mg by mouth every morning.    Marland Kitchen. NOVOLOG 100 UNIT/ML injection USE VIA INSULIN PUMP. MAX OF 80 UNITS PER DAY. 20 mL 2   No current facility-administered medications on file prior to visit.     Allergies  Allergen Reactions  . Penicillins Anaphylaxis  . Amoxicillin Other (See Comments)    Social History   Socioeconomic History  . Marital status: Married    Spouse name: Not on file  . Number of children: Not on file  . Years of education: Not on file  . Highest education level: Not on file  Social Needs  . Financial resource strain: Not on file  . Food insecurity - worry: Not on file  . Food insecurity - inability: Not on file  . Transportation needs - medical: Not on file  . Transportation needs - non-medical: Not on file  Occupational History  . Not on file  Tobacco Use  . Smoking status: Never Smoker  .  Smokeless tobacco: Never Used  Substance and Sexual Activity  . Alcohol use: Yes    Comment: rare  . Drug use: No  . Sexual activity: Yes    Birth control/protection: IUD  Other Topics Concern  . Not on file  Social History Narrative  . Not on file    Family History  Problem Relation Age of Onset  . Breast cancer Neg Hx   . Ovarian cancer Neg Hx   . Colon cancer Neg Hx   . Diabetes Neg Hx   . Heart disease Neg Hx   . Diabetes gravidarum Neg Hx   . Diabetes type I Neg Hx     The following portions of the patient's history were reviewed and updated as appropriate: allergies, current medications, past family history, past medical history, past social history, past surgical history and problem list.  Review of Systems Review of Systems  Constitutional: Negative.   HENT: Negative.    Eyes: Negative.   Respiratory: Negative.   Cardiovascular: Negative.   Gastrointestinal: Negative.   Genitourinary:       Irregular spotting with IUD in place  Skin: Negative.   Neurological: Negative.   Endo/Heme/Allergies: Negative.   Psychiatric/Behavioral: Negative.       Objective:   BP 111/74   Pulse 99   Ht 5' (1.524 m)   Wt 168 lb 3.2 oz (76.3 kg)   LMP 07/03/2017 (Approximate) Comment: spotting only  BMI 32.85 kg/m  CONSTITUTIONAL: Well-developed, well-nourished female in no acute distress.  PSYCHIATRIC: Normal mood and affect. Normal behavior. Normal judgment and thought content. NEUROLGIC: Alert and oriented to person, place, and time. Normal muscle tone coordination. No cranial nerve deficit noted. HENT:  Normocephalic, atraumatic EYES: Conjunctivae and EOM are normal.  No scleral icterus.  NECK: Normal range of motion, supple, no masses.  Normal thyroid.  SKIN: Skin is warm and dry. No rash noted. Not diaphoretic. No erythema. No pallor. CARDIOVASCULAR: Normal heart rate noted, regular rhythm, no murmur. RESPIRATORY: Clear to auscultation bilaterally. Effort and breath sounds normal, no problems with respiration noted. BREASTS: Symmetric in size. No masses, skin changes, nipple drainage, or lymphadenopathy. ABDOMEN: Soft, normal bowel sounds, no distention noted.  No tenderness, rebound or guarding.  BLADDER: Normal PELVIC:  External Genitalia: Normal  BUS: Normal  Vagina: Normal; minimal white secretions in vault  Cervix: Normal; IUD string visualized-3-1/2 cm; no cervical motion tenderness  Uterus: Normal; midplane, normal size and shape, mobile, nontender  Adnexa: Normal; nonpalpable and nontender  RV: External Exam NormaI  MUSCULOSKELETAL: Normal range of motion. No tenderness.  No cyanosis, clubbing, or edema.  2+ distal pulses. LYMPHATIC: No Axillary, Supraclavicular, or Inguinal Adenopathy.    Assessment:   Annual gynecologic examination 26  y.o. Contraception: IUD Obesity 1 Type 1 diabetes mellitus, on insulin pump with labile blood sugars History of cesarean section ADHD Spotting with IUD in place, monthly  Plan:  Pap: Not done Mammogram: Not Indicated Stool Guaiac Testing:  Not Indicated Labs: ., Lipid 1, TSH and Vit D Level"". Routine preventative health maintenance measures emphasized: Exercise/Diet/Weight control, Tobacco Warnings and Alcohol/Substance use risks  Contraception-IUD Return to Clinic - 1 Year   Crystal BonoMiller, CMA  Herold HarmsMartin A Viva Gallaher, MD   Note: This dictation was prepared with Dragon dictation along with smaller phrase technology. Any transcriptional errors that result from this process are unintentional.

## 2017-09-05 ENCOUNTER — Encounter: Payer: Self-pay | Admitting: Obstetrics and Gynecology

## 2017-09-05 ENCOUNTER — Ambulatory Visit (INDEPENDENT_AMBULATORY_CARE_PROVIDER_SITE_OTHER): Payer: BLUE CROSS/BLUE SHIELD | Admitting: Obstetrics and Gynecology

## 2017-09-05 VITALS — BP 111/74 | HR 99 | Ht 60.0 in | Wt 168.2 lb

## 2017-09-05 DIAGNOSIS — E109 Type 1 diabetes mellitus without complications: Secondary | ICD-10-CM | POA: Diagnosis not present

## 2017-09-05 DIAGNOSIS — Z01419 Encounter for gynecological examination (general) (routine) without abnormal findings: Secondary | ICD-10-CM

## 2017-09-05 DIAGNOSIS — Z975 Presence of (intrauterine) contraceptive device: Secondary | ICD-10-CM | POA: Diagnosis not present

## 2017-09-05 DIAGNOSIS — F909 Attention-deficit hyperactivity disorder, unspecified type: Secondary | ICD-10-CM

## 2017-09-05 DIAGNOSIS — Z98891 History of uterine scar from previous surgery: Secondary | ICD-10-CM | POA: Diagnosis not present

## 2017-09-05 NOTE — Patient Instructions (Signed)
1.  Pap smear not done today.  Next Pap smear is due 2020 2.  Self breast awareness is encouraged 3.  Screening labs are ordered 4.  Continue with healthy eating and exercise 5.  Contraception-Mirena IUD 6.  Return in 1 year for annual exam  Health Maintenance, Female Adopting a healthy lifestyle and getting preventive care can go a long way to promote health and wellness. Talk with your health care provider about what schedule of regular examinations is right for you. This is a good chance for you to check in with your provider about disease prevention and staying healthy. In between checkups, there are plenty of things you can do on your own. Experts have done a lot of research about which lifestyle changes and preventive measures are most likely to keep you healthy. Ask your health care provider for more information. Weight and diet Eat a healthy diet  Be sure to include plenty of vegetables, fruits, low-fat dairy products, and lean protein.  Do not eat a lot of foods high in solid fats, added sugars, or salt.  Get regular exercise. This is one of the most important things you can do for your health. ? Most adults should exercise for at least 150 minutes each week. The exercise should increase your heart rate and make you sweat (moderate-intensity exercise). ? Most adults should also do strengthening exercises at least twice a week. This is in addition to the moderate-intensity exercise.  Maintain a healthy weight  Body mass index (BMI) is a measurement that can be used to identify possible weight problems. It estimates body fat based on height and weight. Your health care provider can help determine your BMI and help you achieve or maintain a healthy weight.  For females 24 years of age and older: ? A BMI below 18.5 is considered underweight. ? A BMI of 18.5 to 24.9 is normal. ? A BMI of 25 to 29.9 is considered overweight. ? A BMI of 30 and above is considered obese.  Watch levels  of cholesterol and blood lipids  You should start having your blood tested for lipids and cholesterol at 28 years of age, then have this test every 5 years.  You may need to have your cholesterol levels checked more often if: ? Your lipid or cholesterol levels are high. ? You are older than 28 years of age. ? You are at high risk for heart disease.  Cancer screening Lung Cancer  Lung cancer screening is recommended for adults 74-58 years old who are at high risk for lung cancer because of a history of smoking.  A yearly low-dose CT scan of the lungs is recommended for people who: ? Currently smoke. ? Have quit within the past 15 years. ? Have at least a 30-pack-year history of smoking. A pack year is smoking an average of one pack of cigarettes a day for 1 year.  Yearly screening should continue until it has been 15 years since you quit.  Yearly screening should stop if you develop a health problem that would prevent you from having lung cancer treatment.  Breast Cancer  Practice breast self-awareness. This means understanding how your breasts normally appear and feel.  It also means doing regular breast self-exams. Let your health care provider know about any changes, no matter how small.  If you are in your 20s or 30s, you should have a clinical breast exam (CBE) by a health care provider every 1-3 years as part of a regular  health exam.  If you are 40 or older, have a CBE every year. Also consider having a breast X-ray (mammogram) every year.  If you have a family history of breast cancer, talk to your health care provider about genetic screening.  If you are at high risk for breast cancer, talk to your health care provider about having an MRI and a mammogram every year.  Breast cancer gene (BRCA) assessment is recommended for women who have family members with BRCA-related cancers. BRCA-related cancers include: ? Breast. ? Ovarian. ? Tubal. ? Peritoneal  cancers.  Results of the assessment will determine the need for genetic counseling and BRCA1 and BRCA2 testing.  Cervical Cancer Your health care provider may recommend that you be screened regularly for cancer of the pelvic organs (ovaries, uterus, and vagina). This screening involves a pelvic examination, including checking for microscopic changes to the surface of your cervix (Pap test). You may be encouraged to have this screening done every 3 years, beginning at age 41.  For women ages 56-65, health care providers may recommend pelvic exams and Pap testing every 3 years, or they may recommend the Pap and pelvic exam, combined with testing for human papilloma virus (HPV), every 5 years. Some types of HPV increase your risk of cervical cancer. Testing for HPV may also be done on women of any age with unclear Pap test results.  Other health care providers may not recommend any screening for nonpregnant women who are considered low risk for pelvic cancer and who do not have symptoms. Ask your health care provider if a screening pelvic exam is right for you.  If you have had past treatment for cervical cancer or a condition that could lead to cancer, you need Pap tests and screening for cancer for at least 20 years after your treatment. If Pap tests have been discontinued, your risk factors (such as having a new sexual partner) need to be reassessed to determine if screening should resume. Some women have medical problems that increase the chance of getting cervical cancer. In these cases, your health care provider may recommend more frequent screening and Pap tests.  Colorectal Cancer  This type of cancer can be detected and often prevented.  Routine colorectal cancer screening usually begins at 28 years of age and continues through 28 years of age.  Your health care provider may recommend screening at an earlier age if you have risk factors for colon cancer.  Your health care provider may also  recommend using home test kits to check for hidden blood in the stool.  A small camera at the end of a tube can be used to examine your colon directly (sigmoidoscopy or colonoscopy). This is done to check for the earliest forms of colorectal cancer.  Routine screening usually begins at age 71.  Direct examination of the colon should be repeated every 5-10 years through 28 years of age. However, you may need to be screened more often if early forms of precancerous polyps or small growths are found.  Skin Cancer  Check your skin from head to toe regularly.  Tell your health care provider about any new moles or changes in moles, especially if there is a change in a mole's shape or color.  Also tell your health care provider if you have a mole that is larger than the size of a pencil eraser.  Always use sunscreen. Apply sunscreen liberally and repeatedly throughout the day.  Protect yourself by wearing long sleeves, pants, a  wide-brimmed hat, and sunglasses whenever you are outside.  Heart disease, diabetes, and high blood pressure  High blood pressure causes heart disease and increases the risk of stroke. High blood pressure is more likely to develop in: ? People who have blood pressure in the high end of the normal range (130-139/85-89 mm Hg). ? People who are overweight or obese. ? People who are African American.  If you are 8-4 years of age, have your blood pressure checked every 3-5 years. If you are 52 years of age or older, have your blood pressure checked every year. You should have your blood pressure measured twice-once when you are at a hospital or clinic, and once when you are not at a hospital or clinic. Record the average of the two measurements. To check your blood pressure when you are not at a hospital or clinic, you can use: ? An automated blood pressure machine at a pharmacy. ? A home blood pressure monitor.  If you are between 41 years and 65 years old, ask your  health care provider if you should take aspirin to prevent strokes.  Have regular diabetes screenings. This involves taking a blood sample to check your fasting blood sugar level. ? If you are at a normal weight and have a low risk for diabetes, have this test once every three years after 28 years of age. ? If you are overweight and have a high risk for diabetes, consider being tested at a younger age or more often. Preventing infection Hepatitis B  If you have a higher risk for hepatitis B, you should be screened for this virus. You are considered at high risk for hepatitis B if: ? You were born in a country where hepatitis B is common. Ask your health care provider which countries are considered high risk. ? Your parents were born in a high-risk country, and you have not been immunized against hepatitis B (hepatitis B vaccine). ? You have HIV or AIDS. ? You use needles to inject street drugs. ? You live with someone who has hepatitis B. ? You have had sex with someone who has hepatitis B. ? You get hemodialysis treatment. ? You take certain medicines for conditions, including cancer, organ transplantation, and autoimmune conditions.  Hepatitis C  Blood testing is recommended for: ? Everyone born from [birth year] through 1967. ? Anyone with known risk factors for hepatitis C.  Sexually transmitted infections (STIs)  You should be screened for sexually transmitted infections (STIs) including gonorrhea and chlamydia if: ? You are sexually active and are younger than 28 years of age. ? You are older than 28 years of age and your health care provider tells you that you are at risk for this type of infection. ? Your sexual activity has changed since you were last screened and you are at an increased risk for chlamydia or gonorrhea. Ask your health care provider if you are at risk.  If you do not have HIV, but are at risk, it may be recommended that you take a prescription medicine daily to  prevent HIV infection. This is called pre-exposure prophylaxis (PrEP). You are considered at risk if: ? You are sexually active and do not regularly use condoms or know the HIV status of your partner(s). ? You take drugs by injection. ? You are sexually active with a partner who has HIV.  Talk with your health care provider about whether you are at high risk of being infected with HIV. If you choose to  begin PrEP, you should first be tested for HIV. You should then be tested every 3 months for as long as you are taking PrEP. Pregnancy  If you are premenopausal and you may become pregnant, ask your health care provider about preconception counseling.  If you may become pregnant, take 400 to 800 micrograms (mcg) of folic acid every day.  If you want to prevent pregnancy, talk to your health care provider about birth control (contraception). Osteoporosis and menopause  Osteoporosis is a disease in which the bones lose minerals and strength with aging. This can result in serious bone fractures. Your risk for osteoporosis can be identified using a bone density scan.  If you are 76 years of age or older, or if you are at risk for osteoporosis and fractures, ask your health care provider if you should be screened.  Ask your health care provider whether you should take a calcium or vitamin D supplement to lower your risk for osteoporosis.  Menopause may have certain physical symptoms and risks.  Hormone replacement therapy may reduce some of these symptoms and risks. Talk to your health care provider about whether hormone replacement therapy is right for you. Follow these instructions at home:  Schedule regular health, dental, and eye exams.  Stay current with your immunizations.  Do not use any tobacco products including cigarettes, chewing tobacco, or electronic cigarettes.  If you are pregnant, do not drink alcohol.  If you are breastfeeding, limit how much and how often you drink  alcohol.  Limit alcohol intake to no more than 1 drink per day for nonpregnant women. One drink equals 12 ounces of beer, 5 ounces of wine, or 1 ounces of hard liquor.  Do not use street drugs.  Do not share needles.  Ask your health care provider for help if you need support or information about quitting drugs.  Tell your health care provider if you often feel depressed.  Tell your health care provider if you have ever been abused or do not feel safe at home. This information is not intended to replace advice given to you by your health care provider. Make sure you discuss any questions you have with your health care provider. Document Released: 04/04/2011 Document Revised: 02/25/2016 Document Reviewed: 06/23/2015 Elsevier Interactive Patient Education  [employer].

## 2017-09-06 ENCOUNTER — Other Ambulatory Visit: Payer: BLUE CROSS/BLUE SHIELD

## 2017-09-06 DIAGNOSIS — Z01419 Encounter for gynecological examination (general) (routine) without abnormal findings: Secondary | ICD-10-CM | POA: Diagnosis not present

## 2017-09-06 DIAGNOSIS — E669 Obesity, unspecified: Secondary | ICD-10-CM | POA: Diagnosis not present

## 2017-09-07 LAB — LIPID PANEL
CHOLESTEROL TOTAL: 136 mg/dL (ref 100–199)
Chol/HDL Ratio: 3 ratio (ref 0.0–4.4)
HDL: 46 mg/dL (ref >39)
LDL Calculated: 82 mg/dL (ref 0–99)
Triglycerides: 39 mg/dL (ref 0–149)
VLDL CHOLESTEROL CAL: 8 mg/dL (ref 5–40)

## 2017-09-07 LAB — TSH: TSH: 1.83 u[IU]/mL (ref 0.450–4.500)

## 2017-10-04 DIAGNOSIS — F900 Attention-deficit hyperactivity disorder, predominantly inattentive type: Secondary | ICD-10-CM | POA: Diagnosis not present

## 2017-10-20 ENCOUNTER — Ambulatory Visit: Payer: BLUE CROSS/BLUE SHIELD | Admitting: Endocrinology

## 2017-11-06 DIAGNOSIS — F900 Attention-deficit hyperactivity disorder, predominantly inattentive type: Secondary | ICD-10-CM | POA: Diagnosis not present

## 2017-11-06 DIAGNOSIS — Z79899 Other long term (current) drug therapy: Secondary | ICD-10-CM | POA: Diagnosis not present

## 2017-11-08 ENCOUNTER — Ambulatory Visit (INDEPENDENT_AMBULATORY_CARE_PROVIDER_SITE_OTHER): Payer: BLUE CROSS/BLUE SHIELD | Admitting: Endocrinology

## 2017-11-08 ENCOUNTER — Encounter: Payer: Self-pay | Admitting: Endocrinology

## 2017-11-08 VITALS — BP 119/88 | HR 103 | Ht 60.0 in | Wt 168.6 lb

## 2017-11-08 DIAGNOSIS — E109 Type 1 diabetes mellitus without complications: Secondary | ICD-10-CM | POA: Diagnosis not present

## 2017-11-08 LAB — POCT GLYCOSYLATED HEMOGLOBIN (HGB A1C): Hemoglobin A1C: 7.6

## 2017-11-08 NOTE — Progress Notes (Signed)
Subjective:    Patient ID: Jordan Leonard, female    DOB: January 03, 1991, 27 y.o.   MRN: [id number]  HPI Pt returns for f/u of diabetes mellitus: DM type: Insulin-requiring type 2. Dx'ed: 2000 Complications: none Therapy: insulin since dx, and pump rx since 2005 ([employer]--current pump since 2013) GDM: never.  DKA: only once, at dx Severe hypoglycemia: never Pancreatitis: never Pancreatic imaging: never.  Other: she has a [employer] pump (current one since 2013); she says she cannot afford continuous glucose monitor; she is not at risk for pregnancy now.  Interval history: Despite no change in her weight, she says her diet is much better.  She has reduced her pump settings to: basal rate of 1.1units/hr units/hr, except for 1.4 units/hr, 3AM-6AM.   mealtime bolus of 1 unit/10 grams carbohydrate.  correction bolus (which some people call "sensitivity," or "insulin sensitivity ratio," or just "isr") of 1 unit for each by 45 which your glucose exceeds 100, at all times of day.  She averages approx 35 total units per day.   no cbg record, but states she has mild hypoglycemia approx once per week.  it varies from 60-250, but most are in the mid-100's.  There is no trend throughout the day.  She says if she misses a meal (and the bolus) cbg decreases.  Past Medical History:  Diagnosis Date  . ADHD   . Anxiety   . Diabetes mellitus without complication (HCC)   . STD (sexually transmitted disease)    chlaymdia- 10 years ago    Past Surgical History:  Procedure Laterality Date  . CESAREAN SECTION      Social History   Socioeconomic History  . Marital status: Married    Spouse name: Not on file  . Number of children: Not on file  . Years of education: Not on file  . Highest education level: Not on file  Social Needs  . Financial resource strain: Not on file  . Food insecurity - worry: Not on file  . Food insecurity - inability: Not on file  . Transportation needs - medical:  Not on file  . Transportation needs - non-medical: Not on file  Occupational History  . Not on file  Tobacco Use  . Smoking status: Never Smoker  . Smokeless tobacco: Never Used  Substance and Sexual Activity  . Alcohol use: Yes    Comment: rare  . Drug use: No  . Sexual activity: Yes    Birth control/protection: IUD  Other Topics Concern  . Not on file  Social History Narrative  . Not on file    Current Outpatient Medications on File Prior to Visit  Medication Sig Dispense Refill  . clonazePAM (KLONOPIN) 0.5 MG tablet Take 1 tablet by mouth 2 (two) times daily.  0  . CONTOUR NEXT TEST test strip CHECK BLOOD SUGARS 8 X A DAY, AND LANCETS 8/DAY 300 each 3  . dextroamphetamine (DEXTROSTAT) 10 MG tablet Take 10 mg by mouth daily.    Marland Kitchen glucagon (GLUCAGON EMERGENCY) 1 MG injection 1 mg.    Marland Kitchen glucose blood (BAYER CONTOUR NEXT TEST) test strip CHECK BLOOD SUGARS 8 X A DAY, and lancets 8/day 900 each 3  . Insulin Infusion Pump Supplies (MINIMED INFUSION SET-MMT 396) MISC 1 Device by Does not apply route every 3 (three) days. 30 each 3  . Insulin Infusion Pump Supplies (PARADIGM PUMP RESERVOIR 1.8ML) MISC 1 Device by Does not apply route every 3 (three) days. 30 each 3  .  lisdexamfetamine (VYVANSE) 40 MG capsule Take 40 mg by mouth every morning.    Marland Kitchen. NOVOLOG 100 UNIT/ML injection USE VIA INSULIN PUMP. MAX OF 80 UNITS PER DAY. 20 mL 2   No current facility-administered medications on file prior to visit.     Allergies  Allergen Reactions  . Penicillins Anaphylaxis  . Amoxicillin Other (See Comments)    Family History  Problem Relation Age of Onset  . Breast cancer Neg Hx   . Ovarian cancer Neg Hx   . Colon cancer Neg Hx   . Diabetes Neg Hx   . Heart disease Neg Hx   . Diabetes gravidarum Neg Hx   . Diabetes type I Neg Hx     BP 119/88 (BP Location: Left Arm, Patient Position: Sitting, Cuff Size: Large)   Pulse (!) 103   Ht 5' (1.524 m)   Wt 168 lb 9.6 oz (76.5 kg)   BMI  32.93 kg/m    Review of Systems Denies LOC and weight change    Objective:   Physical Exam VITAL SIGNS:  See vs page GENERAL: no distress Pulses: dorsalis pedis intact bilat.   MSK: no deformity of the feet CV: no leg edema Skin:  no ulcer on the feet.  normal color and temp on the feet. Neuro: sensation is intact to touch on the feet   Lab Results  Component Value Date   HGBA1C 7.6 11/08/2017       Assessment & Plan:  Insulin-requiring type 2 DM: worse Hypoglycemia: we will work around this.   Patient Instructions  check your blood sugar 8 times a day.  vary the time of day when you check, between before the 3 meals, and at bedtime.  also check if you have symptoms of your blood sugar being too high or too low.  please keep a record of the readings and bring it to your next appointment here (or you can bring the meter itself).  You can write it on any piece of paper.  please call us sooner if your blood sugar goes below 70, or if you have a lot of readings over 200.  Please take these settings: basal rate of 1.1 units/hr, except for 1.4 units/hr, 3 am to 6 am.   mealtime bolus of 1 unit/9 grams carbohydrate at all times of day.  correction bolus (which some people call "sensitivity," or "insulin sensitivity ratio," or just "isr") of 1 unit for each 45 by which your glucose exceeds 100.   In view of your medical condition, you should avoid pregnancy until we have decided it is safe.  Please come back for a follow-up appointment in 3-4 months.

## 2017-11-08 NOTE — Patient Instructions (Addendum)
check your blood sugar 8 times a day.  vary the time of day when you check, between before the 3 meals, and at bedtime.  also check if you have symptoms of your blood sugar being too high or too low.  please keep a record of the readings and bring it to your next appointment here (or you can bring the meter itself).  You can write it on any piece of paper.  please call us sooner if your blood sugar goes below 70, or if you have a lot of readings over 200.  Please take these settings: basal rate of 1.1 units/hr, except for 1.4 units/hr, 3 am to 6 am.   mealtime bolus of 1 unit/9 grams carbohydrate at all times of day.  correction bolus (which some people call "sensitivity," or "insulin sensitivity ratio," or just "isr") of 1 unit for each 45 by which your glucose exceeds 100.   In view of your medical condition, you should avoid pregnancy until we have decided it is safe.  Please come back for a follow-up appointment in 3-4 months.

## 2017-11-20 ENCOUNTER — Other Ambulatory Visit: Payer: Self-pay | Admitting: Endocrinology

## 2017-11-21 DIAGNOSIS — F900 Attention-deficit hyperactivity disorder, predominantly inattentive type: Secondary | ICD-10-CM | POA: Diagnosis not present

## 2017-12-08 DIAGNOSIS — J042 Acute laryngotracheitis: Secondary | ICD-10-CM | POA: Diagnosis not present

## 2018-01-03 DIAGNOSIS — F900 Attention-deficit hyperactivity disorder, predominantly inattentive type: Secondary | ICD-10-CM | POA: Diagnosis not present

## 2018-01-16 DIAGNOSIS — D485 Neoplasm of uncertain behavior of skin: Secondary | ICD-10-CM | POA: Diagnosis not present

## 2018-01-16 DIAGNOSIS — L578 Other skin changes due to chronic exposure to nonionizing radiation: Secondary | ICD-10-CM | POA: Diagnosis not present

## 2018-01-29 DIAGNOSIS — F900 Attention-deficit hyperactivity disorder, predominantly inattentive type: Secondary | ICD-10-CM | POA: Diagnosis not present

## 2018-02-06 DIAGNOSIS — D2262 Melanocytic nevi of left upper limb, including shoulder: Secondary | ICD-10-CM | POA: Diagnosis not present

## 2018-02-06 DIAGNOSIS — D485 Neoplasm of uncertain behavior of skin: Secondary | ICD-10-CM | POA: Diagnosis not present

## 2018-02-20 DIAGNOSIS — D225 Melanocytic nevi of trunk: Secondary | ICD-10-CM | POA: Diagnosis not present

## 2018-02-20 DIAGNOSIS — D485 Neoplasm of uncertain behavior of skin: Secondary | ICD-10-CM | POA: Diagnosis not present

## 2018-03-05 ENCOUNTER — Telehealth: Payer: Self-pay | Admitting: Endocrinology

## 2018-03-05 NOTE — Telephone Encounter (Signed)
[employer] is calling for the status of paperwork that was faxed over on the 29th for patients pump supplies.    FAX-[phone] PHONE- [phone] Option 2

## 2018-03-06 NOTE — Telephone Encounter (Signed)
I called [employer] & they had received completed paperwork.

## 2018-03-08 ENCOUNTER — Encounter: Payer: Self-pay | Admitting: Endocrinology

## 2018-03-08 ENCOUNTER — Ambulatory Visit (INDEPENDENT_AMBULATORY_CARE_PROVIDER_SITE_OTHER): Payer: BLUE CROSS/BLUE SHIELD | Admitting: Endocrinology

## 2018-03-08 VITALS — BP 124/80 | HR 94 | Wt 168.8 lb

## 2018-03-08 DIAGNOSIS — E109 Type 1 diabetes mellitus without complications: Secondary | ICD-10-CM

## 2018-03-08 LAB — POCT GLYCOSYLATED HEMOGLOBIN (HGB A1C): Hemoglobin A1C: 8.2 % — AB (ref 4.0–5.6)

## 2018-03-08 MED ORDER — INSULIN ASPART 100 UNIT/ML ~~LOC~~ SOLN: SUBCUTANEOUS | 11 refills | Status: DC

## 2018-03-08 NOTE — Progress Notes (Signed)
Subjective:    Patient ID: Jordan Leonard, female    DOB: [birth year], 27 y.o.   MRN: [id number]  HPI Pt returns for f/u of diabetes mellitus: DM type: Insulin-requiring type 2. Dx'ed: 2000 Complications: none Therapy: insulin since dx, and pump rx since 2005 (medtronic--current pump since 2013) GDM: never.  DKA: only once, at dx Severe hypoglycemia: never Pancreatitis: never Pancreatic imaging: never.  Other: she has a medtronic pump (current one since 2013); she says she cannot afford continuous glucose monitor; she is not at risk for pregnancy now.  Interval history:  She takes these pump settings:  basal rate of 1.1 units/hr, except for 1.4 units/hr, 3 am to 6 am.   mealtime bolus of 1 unit/9 grams carbohydrate at all times of day.  correction bolus (which some people call "sensitivity," or "insulin sensitivity ratio," or just "isr") of 1 unit for each 45 by which glucose exceeds 100.   She averages approx 35 total units per day.   Meter is downloaded today, and the printout is scanned into the record.  cbg varies from 60-200's.  There is no trend throughout the day.  However, pt says cbg's are highest after eating.  Past Medical History:  Diagnosis Date  . ADHD   . Anxiety   . Diabetes mellitus without complication (HCC)   . STD (sexually transmitted disease)    chlaymdia- 10 years ago    Past Surgical History:  Procedure Laterality Date  . CESAREAN SECTION      Social History   Socioeconomic History  . Marital status: Married    Spouse name: Not on file  . Number of children: Not on file  . Years of education: Not on file  . Highest education level: Not on file  Occupational History  . Not on file  Social Needs  . Financial resource strain: Not on file  . Food insecurity:    Worry: Not on file    Inability: Not on file  . Transportation needs:    Medical: Not on file    Non-medical: Not on file  Tobacco Use  . Smoking status: Never Smoker  .  Smokeless tobacco: Never Used  Substance and Sexual Activity  . Alcohol use: Yes    Comment: rare  . Drug use: No  . Sexual activity: Yes    Birth control/protection: IUD  Lifestyle  . Physical activity:    Days per week: 0 days    Minutes per session: 0 min  . Stress: Not on file  Relationships  . Social connections:    Talks on phone: Not on file    Gets together: Not on file    Attends religious service: Not on file    Active member of club or organization: Not on file    Attends meetings of clubs or organizations: Not on file    Relationship status: Not on file  . Intimate partner violence:    Fear of current or ex partner: No    Emotionally abused: No    Physically abused: No    Forced sexual activity: No  Other Topics Concern  . Not on file  Social History Narrative  . Not on file    Current Outpatient Medications on File Prior to Visit  Medication Sig Dispense Refill  . atomoxetine (STRATTERA) 25 MG capsule Take 25 mg by mouth daily.    . clonazePAM (KLONOPIN) 0.5 MG tablet Take 1 tablet by mouth 2 (two) times daily as needed.  0  . CONTOUR NEXT TEST test strip CHECK BLOOD SUGARS 8 X A DAY, AND LANCETS 8/DAY 300 each 3  . dextroamphetamine (DEXTROSTAT) 10 MG tablet Take 10 mg by mouth daily.    Marland Kitchen glucagon (GLUCAGON EMERGENCY) 1 MG injection 1 mg.    Marland Kitchen glucose blood (BAYER CONTOUR NEXT TEST) test strip CHECK BLOOD SUGARS 8 X A DAY, and lancets 8/day 900 each 3  . Insulin Infusion Pump Supplies (MINIMED INFUSION SET-MMT 396) MISC 1 Device by Does not apply route every 3 (three) days. 30 each 3  . Insulin Infusion Pump Supplies (PARADIGM PUMP RESERVOIR 1.8ML) MISC 1 Device by Does not apply route every 3 (three) days. 30 each 3  . lisdexamfetamine (VYVANSE) 40 MG capsule Take 40 mg by mouth every morning.     No current facility-administered medications on file prior to visit.     Allergies  Allergen Reactions  . Penicillins Anaphylaxis  . Amoxicillin Other (See  Comments)    Family History  Problem Relation Age of Onset  . Breast cancer Neg Hx   . Ovarian cancer Neg Hx   . Colon cancer Neg Hx   . Diabetes Neg Hx   . Heart disease Neg Hx   . Diabetes gravidarum Neg Hx   . Diabetes type I Neg Hx     BP 124/80   Pulse 94   Wt 168 lb 12.8 oz (76.6 kg)   SpO2 94%   BMI 32.97 kg/m   Review of Systems No weight change.  She seldom has hypoglycemia, and these episodes are mild.       Objective:   Physical Exam VITAL SIGNS:  See vs page GENERAL: no distress Pulses: foot pulses are intact bilaterally.   MSK: no deformity of the feet or ankles.  CV: no edema of the legs or ankles Skin:  no ulcer on the feet or ankles.  normal color and temp on the feet and ankles Neuro: sensation is intact to touch on the feet and ankles.      Lab Results  Component Value Date   HGBA1C 8.2 (A) 03/08/2018       Assessment & Plan:  Type 1 DM: she needs increased rx   Patient Instructions  check your blood sugar 8 times a day.  vary the time of day when you check, between before the 3 meals, and at bedtime.  also check if you have symptoms of your blood sugar being too high or too low.  please keep a record of the readings and bring it to your next appointment here (or you can bring the meter itself).  You can write it on any piece of paper.  please call us sooner if your blood sugar goes below 70, or if you have a lot of readings over 200.  Please take these settings: basal rate of 1.1 units/hr, except for 1.4 units/hr, 3 AM to 6 AM.   mealtime bolus of 1 unit/8 grams carbohydrate at all times of day.   correction bolus (which some people call "sensitivity," or "insulin sensitivity ratio," or just "isr") of 1 unit for each 45 by which your glucose exceeds 100.   In view of your medical condition, you should avoid pregnancy until we have decided it is safe.  Please come back for a follow-up appointment in 3 months.

## 2018-03-08 NOTE — Patient Instructions (Addendum)
check your blood sugar 8 times a day.  vary the time of day when you check, between before the 3 meals, and at bedtime.  also check if you have symptoms of your blood sugar being too high or too low.  please keep a record of the readings and bring it to your next appointment here (or you can bring the meter itself).  You can write it on any piece of paper.  please call us sooner if your blood sugar goes below 70, or if you have a lot of readings over 200.  Please take these settings: basal rate of 1.1 units/hr, except for 1.4 units/hr, 3 AM to 6 AM.   mealtime bolus of 1 unit/8 grams carbohydrate at all times of day.   correction bolus (which some people call "sensitivity," or "insulin sensitivity ratio," or just "isr") of 1 unit for each 45 by which your glucose exceeds 100.   In view of your medical condition, you should avoid pregnancy until we have decided it is safe.  Please come back for a follow-up appointment in 3 months.

## 2018-03-12 DIAGNOSIS — F900 Attention-deficit hyperactivity disorder, predominantly inattentive type: Secondary | ICD-10-CM | POA: Diagnosis not present

## 2018-03-15 DIAGNOSIS — D485 Neoplasm of uncertain behavior of skin: Secondary | ICD-10-CM | POA: Diagnosis not present

## 2018-03-19 DIAGNOSIS — D485 Neoplasm of uncertain behavior of skin: Secondary | ICD-10-CM | POA: Diagnosis not present

## 2018-03-30 ENCOUNTER — Other Ambulatory Visit: Payer: Self-pay | Admitting: Endocrinology

## 2018-04-17 DIAGNOSIS — F900 Attention-deficit hyperactivity disorder, predominantly inattentive type: Secondary | ICD-10-CM | POA: Diagnosis not present

## 2018-04-18 DIAGNOSIS — F900 Attention-deficit hyperactivity disorder, predominantly inattentive type: Secondary | ICD-10-CM | POA: Diagnosis not present

## 2018-04-18 DIAGNOSIS — Z79899 Other long term (current) drug therapy: Secondary | ICD-10-CM | POA: Diagnosis not present

## 2018-04-30 DIAGNOSIS — D225 Melanocytic nevi of trunk: Secondary | ICD-10-CM | POA: Diagnosis not present

## 2018-05-08 DIAGNOSIS — R10826 Epigastric rebound abdominal tenderness: Secondary | ICD-10-CM | POA: Diagnosis not present

## 2018-05-08 DIAGNOSIS — K219 Gastro-esophageal reflux disease without esophagitis: Secondary | ICD-10-CM | POA: Diagnosis not present

## 2018-05-14 DIAGNOSIS — F900 Attention-deficit hyperactivity disorder, predominantly inattentive type: Secondary | ICD-10-CM | POA: Diagnosis not present

## 2018-06-14 ENCOUNTER — Encounter: Payer: Self-pay | Admitting: Endocrinology

## 2018-06-14 ENCOUNTER — Ambulatory Visit (INDEPENDENT_AMBULATORY_CARE_PROVIDER_SITE_OTHER): Payer: BLUE CROSS/BLUE SHIELD | Admitting: Endocrinology

## 2018-06-14 VITALS — BP 118/70 | HR 105 | Ht 60.0 in | Wt 176.2 lb

## 2018-06-14 DIAGNOSIS — E109 Type 1 diabetes mellitus without complications: Secondary | ICD-10-CM

## 2018-06-14 LAB — POCT GLYCOSYLATED HEMOGLOBIN (HGB A1C): Hemoglobin A1C: 8.5 % — AB (ref 4.0–5.6)

## 2018-06-14 MED ORDER — GLUCOSE BLOOD VI STRP: ORAL_STRIP | 3 refills | Status: DC

## 2018-06-14 MED ORDER — INSULIN ASPART 100 UNIT/ML ~~LOC~~ SOLN: SUBCUTANEOUS | 11 refills | Status: DC

## 2018-06-14 NOTE — Patient Instructions (Addendum)
check your blood sugar 8 times a day.  vary the time of day when you check, between before the 3 meals, and at bedtime.  also check if you have symptoms of your blood sugar being too high or too low.  please keep a record of the readings and bring it to your next appointment here (or you can bring the meter itself).  You can write it on any piece of paper.  please call us sooner if your blood sugar goes below 70, or if you have a lot of readings over 200.  Please take these settings: basal rate of 1.1 units/hr, except for 1.4 units/hr, 3 AM to 6 AM.   mealtime bolus of 1 unit/7 grams carbohydrate at all times of day.   correction bolus (which some people call "sensitivity," or "insulin sensitivity ratio," or just "isr") of 1 unit for each 45 by which your glucose exceeds 100.   In view of your medical condition, you should avoid pregnancy until we have decided it is safe.  Please see Bonita QuinLinda, to see about getting a new pump.   If you want, we can consider adding "Trulicity," or "Ozempic." Please come back for a follow-up appointment in 2 months.

## 2018-06-14 NOTE — Progress Notes (Signed)
Subjective:    Patient ID: Jordan Leonard, female    DOB: [birth year], 27 y.o.   MRN: [id number]  HPI Pt returns for f/u of diabetes mellitus:  DM type: 1 Dx'ed: 2000 Complications: none Therapy: insulin since dx, and pump rx since 2005 (medtronic--current pump since 2013) GDM: never.  DKA: only once, at dx Severe hypoglycemia: never Pancreatitis: never Pancreatic imaging: never.  Other: she has a medtronic 530 pump (current one since 2013); she says she cannot afford continuous glucose monitor; she is not at risk for pregnancy now.  Interval history:  She takes these pump settings:  basal rate of 1.1 units/hr, except for 1.4 units/hr, 3 am to 6 am.   mealtime bolus of 1 unit/8 grams carbohydrate at all times of day.  correction bolus (which some people call "sensitivity," or "insulin sensitivity ratio," or just "isr") of 1 unit for each 45 by which glucose exceeds 100.   She averages approx 35 total units per day.   Meter is downloaded today, and the printout is scanned into the record.  cbg varies from 49-200's.  It is in general higher as the day goes on She says the pump is old and no longer gives boluses.  She is having to inject novolog in lieu of boluses, but she says she never misses these.  She seldom has hypoglycemia, and these episodes are mild.  She is also not doing CHO counting.   Past Medical History:  Diagnosis Date  . ADHD   . Anxiety   . Diabetes mellitus without complication (HCC)   . STD (sexually transmitted disease)    chlaymdia- 10 years ago    Past Surgical History:  Procedure Laterality Date  . CESAREAN SECTION      Social History   Socioeconomic History  . Marital status: Married    Spouse name: Not on file  . Number of children: Not on file  . Years of education: Not on file  . Highest education level: Not on file  Occupational History  . Not on file  Social Needs  . Financial resource strain: Not on file  . Food insecurity:    Worry:  Not on file    Inability: Not on file  . Transportation needs:    Medical: Not on file    Non-medical: Not on file  Tobacco Use  . Smoking status: Never Smoker  . Smokeless tobacco: Never Used  Substance and Sexual Activity  . Alcohol use: Yes    Comment: rare  . Drug use: No  . Sexual activity: Yes    Birth control/protection: IUD  Lifestyle  . Physical activity:    Days per week: 0 days    Minutes per session: 0 min  . Stress: Not on file  Relationships  . Social connections:    Talks on phone: Not on file    Gets together: Not on file    Attends religious service: Not on file    Active member of club or organization: Not on file    Attends meetings of clubs or organizations: Not on file    Relationship status: Not on file  . Intimate partner violence:    Fear of current or ex partner: No    Emotionally abused: No    Physically abused: No    Forced sexual activity: No  Other Topics Concern  . Not on file  Social History Narrative  . Not on file    Current Outpatient Medications on File  Prior to Visit  Medication Sig Dispense Refill  . atomoxetine (STRATTERA) 25 MG capsule Take 25 mg by mouth daily.    . clonazePAM (KLONOPIN) 0.5 MG tablet Take 1 tablet by mouth 2 (two) times daily as needed.   0  . glucagon (GLUCAGON EMERGENCY) 1 MG injection 1 mg.    Marland Kitchen glucose blood (BAYER CONTOUR NEXT TEST) test strip CHECK BLOOD SUGARS 8 X A DAY, and lancets 8/day 900 each 3  . hydrOXYzine (ATARAX/VISTARIL) 10 MG tablet Take by mouth.    . Insulin Infusion Pump Supplies (MINIMED INFUSION SET-MMT 396) MISC 1 Device by Does not apply route every 3 (three) days. 30 each 3  . Insulin Infusion Pump Supplies (PARADIGM PUMP RESERVOIR 1.8ML) MISC 1 Device by Does not apply route every 3 (three) days. 30 each 3  . lisdexamfetamine (VYVANSE) 40 MG capsule Take 40 mg by mouth every morning.     No current facility-administered medications on file prior to visit.     Allergies  Allergen  Reactions  . Penicillins Anaphylaxis  . Amoxicillin Other (See Comments)    Family History  Problem Relation Age of Onset  . Breast cancer Neg Hx   . Ovarian cancer Neg Hx   . Colon cancer Neg Hx   . Diabetes Neg Hx   . Heart disease Neg Hx   . Diabetes gravidarum Neg Hx   . Diabetes type I Neg Hx     BP 118/70   Pulse (!) 105   Ht 5' (1.524 m)   Wt 176 lb 3.2 oz (79.9 kg)   SpO2 95%   BMI 34.41 kg/m    Review of Systems Denies LOC.     Objective:   Physical Exam VITAL SIGNS:  See vs page GENERAL: no distress Pulses: foot pulses are intact bilaterally.   MSK: no deformity of the feet or ankles.  CV: no edema of the legs or ankles Skin:  no ulcer on the feet or ankles.  normal color and temp on the feet and ankles Neuro: sensation is intact to touch on the feet and ankles.       Lab Results  Component Value Date   HGBA1C 8.5 (A) 06/14/2018       Assessment & Plan:  Type 1 DM: worse Medical device malfunction.  Ref DM educ.  Patient Instructions  check your blood sugar 8 times a day.  vary the time of day when you check, between before the 3 meals, and at bedtime.  also check if you have symptoms of your blood sugar being too high or too low.  please keep a record of the readings and bring it to your next appointment here (or you can bring the meter itself).  You can write it on any piece of paper.  please call us sooner if your blood sugar goes below 70, or if you have a lot of readings over 200.  Please take these settings: basal rate of 1.1 units/hr, except for 1.4 units/hr, 3 AM to 6 AM.   mealtime bolus of 1 unit/7 grams carbohydrate at all times of day.   correction bolus (which some people call "sensitivity," or "insulin sensitivity ratio," or just "isr") of 1 unit for each 45 by which your glucose exceeds 100.   In view of your medical condition, you should avoid pregnancy until we have decided it is safe.  Please see Bonita Quin, to see about getting a new  pump.   If you want, we  can consider adding "Trulicity," or "Ozempic." Please come back for a follow-up appointment in 2 months.

## 2018-06-29 DIAGNOSIS — F900 Attention-deficit hyperactivity disorder, predominantly inattentive type: Secondary | ICD-10-CM | POA: Diagnosis not present

## 2018-07-12 DIAGNOSIS — Z23 Encounter for immunization: Secondary | ICD-10-CM | POA: Diagnosis not present

## 2018-07-13 ENCOUNTER — Encounter: Payer: BLUE CROSS/BLUE SHIELD | Attending: Endocrinology | Admitting: *Deleted

## 2018-07-13 DIAGNOSIS — E109 Type 1 diabetes mellitus without complications: Secondary | ICD-10-CM | POA: Insufficient documentation

## 2018-07-13 DIAGNOSIS — Z713 Dietary counseling and surveillance: Secondary | ICD-10-CM | POA: Insufficient documentation

## 2018-07-17 NOTE — Progress Notes (Signed)
Insulin Pump and / or CGM Evaluation Visit:  Date: 07/13/2018   Appt start time: 0800 end time:  0900.  Assessment:  This patient has DM 1 and their primary concerns today: to learn about what pumps are available now that her Medtronic 530G is out of warranty.  Patient states knowledge of Carb Counting is 9 on a scale of 1-10 Patient states they are currently testing BG greater than 4 times per day Last A1c was 8.7%  Patient states she purchases her supplies herself as she does not want to deal with [facility]. So she is not interested in pump type of CGM at this time do to cost.  She and her husband have been following a Keto Diet for the past year and she states her insulin dose has decreased from 60 to 30 units per day  MEDICATIONS: Novolog in current pump Other diabetes medications: no    Intervention:    Showed patient the following pumps: Medtronic, OmniPod, Tandem  Demonstrated pump, insulin reservoir and infusion set options, and button pushing for bolus delivery of insulin through these pumps  Handouts given during visit include:  Introduction to Pump Therapy Handout  DM 1/Pump Support Group Flyer  Insulin Pump and /or CGM Packet from Medtronic and Omnipod per patient request  Monitoring/Evaluation:    Patient to contact local Pump Company Rep so they can start the process of obtaining the pump. Contact information provided to the patient.  Once pump is shipped, patient may be trained in [employer] or by pump company trainer.

## 2018-08-14 ENCOUNTER — Telehealth: Payer: Self-pay | Admitting: Endocrinology

## 2018-08-14 ENCOUNTER — Telehealth: Payer: Self-pay

## 2018-08-14 NOTE — Telephone Encounter (Signed)
[facility] ph# [phone] called re: they need to follow up on Paperwork-Certificate of medical necessity-Medtronic. Patient is not getting Medtronic-patient is getting Libre-that needs to be corrected. They also need 2 most recent office visit notes AND Medicare qualifying Labs-if patient has had them done.

## 2018-08-14 NOTE — Telephone Encounter (Signed)
LMN has been completed, signed and has been faxed successfully to # [phone]. Documents and fax confirmation have been placed in the faxed file for future reference.

## 2018-08-14 NOTE — Telephone Encounter (Signed)
Correction made by Dr. Everardo AllEllison, documents re-faxed, confirmation received. Last 2 office notes and POCT HgbA1C from both visits also faxed with confirmation received. No other recent labs to be sent.

## 2018-08-15 ENCOUNTER — Encounter: Payer: Self-pay | Admitting: Endocrinology

## 2018-08-15 ENCOUNTER — Ambulatory Visit (INDEPENDENT_AMBULATORY_CARE_PROVIDER_SITE_OTHER): Payer: BLUE CROSS/BLUE SHIELD | Admitting: Endocrinology

## 2018-08-15 VITALS — BP 124/68 | HR 68 | Ht 60.0 in | Wt 178.0 lb

## 2018-08-15 DIAGNOSIS — M79675 Pain in left toe(s): Secondary | ICD-10-CM | POA: Insufficient documentation

## 2018-08-15 DIAGNOSIS — E109 Type 1 diabetes mellitus without complications: Secondary | ICD-10-CM

## 2018-08-15 LAB — POCT GLYCOSYLATED HEMOGLOBIN (HGB A1C): HEMOGLOBIN A1C: 7.9 % — AB (ref 4.0–5.6)

## 2018-08-15 NOTE — Patient Instructions (Addendum)
check your blood sugar 8 times a day.  vary the time of day when you check, between before the 3 meals, and at bedtime.  also check if you have symptoms of your blood sugar being too high or too low.  please keep a record of the readings and bring it to your next appointment here (or you can bring the meter itself).  You can write it on any piece of paper.  please call us sooner if your blood sugar goes below 70, or if you have a lot of readings over 200.  Please continue these settings, when you get your Omnipods:  basal rate of 1.1 units/hr, except for 1.4 units/hr, 3 AM to 6 AM.   mealtime bolus of 1 unit/7 grams carbohydrate at all times of day.   correction bolus (which some people call "sensitivity," or "insulin sensitivity ratio," or just "isr") of 1 unit for each 45 by which your glucose exceeds 100.   In view of your medical condition, you should avoid pregnancy until we have decided it is safe.  If you want, we can consider adding "Trulicity," or "Ozempic."  Please come back for a follow-up appointment in 1 month, or 1 week after you start the new pump. Please see a foot specialist.  you will receive a phone call, about a day and time for an appointment

## 2018-08-15 NOTE — Progress Notes (Signed)
Subjective:    Patient ID: Jordan Leonard, female    DOB: [birth year], 27 y.o.   MRN: [id number]  HPI Pt returns for f/u of diabetes mellitus:  DM type: 1 Dx'ed: 2000 Complications: none Therapy: insulin since dx, and pump rx since 2005 (medtronic--current pump since 2013) GDM: never.  DKA: only once, at dx Severe hypoglycemia: never Pancreatitis: never Pancreatic imaging: never.  Other: she has a medtronic 530 pump (current one since 2013); she says she cannot afford continuous glucose monitor; she is not at risk for pregnancy now.  Interval history:  She takes these pump settings:  basal rate of 1.1 units/hr, except for 1.4 units/hr, 3 am to 6 am.   mealtime bolus of 1 unit/8 grams carbohydrate at all times of day.  correction bolus (which some people call "sensitivity," or "insulin sensitivity ratio," or just "isr") of 1 unit for each 45 by which glucose exceeds 100.   She averages approx 35 total units per day.   Meter is downloaded today, and the printout is scanned into the record.  cbg varies from 62-270.  It is in general higher as the day goes on, but not necessarily so.   She says the pump is old and no longer gives boluses.  She is having to inject novolog in lieu of boluses, but she says she never misses these.  She seldom has hypoglycemia, and these episodes are mild.  She is doing CHO counting, though.   She will start Omnipod pump and freestyle Libre continuous glucose monitor soon.   Past Medical History:  Diagnosis Date  . ADHD   . Anxiety   . Diabetes mellitus without complication (HCC)   . STD (sexually transmitted disease)    chlaymdia- 10 years ago    Past Surgical History:  Procedure Laterality Date  . CESAREAN SECTION      Social History   Socioeconomic History  . Marital status: Married    Spouse name: Not on file  . Number of children: Not on file  . Years of education: Not on file  . Highest education level: Not on file  Occupational  History  . Not on file  Social Needs  . Financial resource strain: Not on file  . Food insecurity:    Worry: Not on file    Inability: Not on file  . Transportation needs:    Medical: Not on file    Non-medical: Not on file  Tobacco Use  . Smoking status: Never Smoker  . Smokeless tobacco: Never Used  Substance and Sexual Activity  . Alcohol use: Yes    Comment: rare  . Drug use: No  . Sexual activity: Yes    Birth control/protection: IUD  Lifestyle  . Physical activity:    Days per week: 0 days    Minutes per session: 0 min  . Stress: Not on file  Relationships  . Social connections:    Talks on phone: Not on file    Gets together: Not on file    Attends religious service: Not on file    Active member of club or organization: Not on file    Attends meetings of clubs or organizations: Not on file    Relationship status: Not on file  . Intimate partner violence:    Fear of current or ex partner: No    Emotionally abused: No    Physically abused: No    Forced sexual activity: No  Other Topics Concern  . Not  on file  Social History Narrative  . Not on file    Current Outpatient Medications on File Prior to Visit  Medication Sig Dispense Refill  . atomoxetine (STRATTERA) 25 MG capsule Take 25 mg by mouth daily.    . clonazePAM (KLONOPIN) 0.5 MG tablet Take 1 tablet by mouth 2 (two) times daily as needed.   0  . glucagon (GLUCAGON EMERGENCY) 1 MG injection 1 mg.    Marland Kitchen glucose blood (BAYER CONTOUR NEXT TEST) test strip CHECK BLOOD SUGARS 8 X A DAY, and lancets 8/day 900 each 3  . glucose blood (CONTOUR NEXT TEST) test strip CHECK BLOOD SUGARS 8 X A DAY, AND LANCETS 8/DAY 300 each 3  . hydrOXYzine (ATARAX/VISTARIL) 10 MG tablet Take by mouth.    . insulin aspart (NOVOLOG) 100 UNIT/ML injection USE VIA INSULIN PUMP. MAX OF 80 UNITS PER DAY. 30 mL 11  . Insulin Infusion Pump Supplies (MINIMED INFUSION SET-MMT 396) MISC 1 Device by Does not apply route every 3 (three) days.  30 each 3  . Insulin Infusion Pump Supplies (PARADIGM PUMP RESERVOIR 1.8ML) MISC 1 Device by Does not apply route every 3 (three) days. 30 each 3  . lisdexamfetamine (VYVANSE) 40 MG capsule Take 40 mg by mouth every morning.     No current facility-administered medications on file prior to visit.     Allergies  Allergen Reactions  . Penicillins Anaphylaxis  . Amoxicillin Other (See Comments)    Family History  Problem Relation Age of Onset  . Breast cancer Neg Hx   . Ovarian cancer Neg Hx   . Colon cancer Neg Hx   . Diabetes Neg Hx   . Heart disease Neg Hx   . Diabetes gravidarum Neg Hx   . Diabetes type I Neg Hx     BP 124/68 (BP Location: Right Arm, Patient Position: Sitting, Cuff Size: Normal)   Pulse 68   Ht 5' (1.524 m)   Wt 178 lb (80.7 kg)   SpO2 98%   BMI 34.76 kg/m   Review of Systems Denies LOC.  She has pain at the right 2nd toe.      Objective:   Physical Exam VITAL SIGNS:  See vs page GENERAL: no distress Pulses: dorsalis pedis intact bilat.   MSK: no deformity of the feet CV: no leg edema Skin:  no ulcer on the feet.  normal color and temp on the feet. Neuro: sensation is intact to touch on the feet  Lab Results  Component Value Date   HGBA1C 7.9 (A) 08/15/2018       Assessment & Plan:  Type 1 DM: she needs increased rx Pump malfunction: she needs a new one Toe pain, new  Patient Instructions  check your blood sugar 8 times a day.  vary the time of day when you check, between before the 3 meals, and at bedtime.  also check if you have symptoms of your blood sugar being too high or too low.  please keep a record of the readings and bring it to your next appointment here (or you can bring the meter itself).  You can write it on any piece of paper.  please call us sooner if your blood sugar goes below 70, or if you have a lot of readings over 200.  Please continue these settings, when you get your Omnipods:  basal rate of 1.1 units/hr, except for  1.4 units/hr, 3 AM to 6 AM.   mealtime bolus of 1 unit/7  grams carbohydrate at all times of day.   correction bolus (which some people call "sensitivity," or "insulin sensitivity ratio," or just "isr") of 1 unit for each 45 by which your glucose exceeds 100.   In view of your medical condition, you should avoid pregnancy until we have decided it is safe.  If you want, we can consider adding "Trulicity," or "Ozempic."  Please come back for a follow-up appointment in 1 month, or 1 week after you start the new pump. Please see a foot specialist.  you will receive a phone call, about a day and time for an appointment

## 2018-08-22 DIAGNOSIS — E109 Type 1 diabetes mellitus without complications: Secondary | ICD-10-CM | POA: Diagnosis not present

## 2018-08-27 DIAGNOSIS — F900 Attention-deficit hyperactivity disorder, predominantly inattentive type: Secondary | ICD-10-CM | POA: Diagnosis not present

## 2018-09-07 NOTE — Progress Notes (Signed)
ANNUAL PREVENTATIVE CARE GYN  ENCOUNTER NOTE  Subjective:       Jordan Leonard is a 27 y.o. [id number] female here for a routine annual gynecologic exam.  Current complaints: 1.  none   History of type 1 diabetes over 15 years; has used insulin pump for the past 15 years; hemoglobin A1c fluctuates significantly.  Last hemoglobin A1c is 7.9.  She is using a glucose sensor as well as the pump at this time. Patient desires conception in the near future. Patient is not exercising regularly. Patient is having spotting only due to Mirena IUD (06/2014) in place; she desires removal today Bowel function and bladder function are normal.   Gynecologic History No LMP recorded. (Menstrual status: IUD). Contraception: IUD- mirena inserted 06/2014 Last Pap: 09/02/2016 neg. Results were: normal Last mammogram: n/a.   Obstetric History OB History  Gravida Para Term Preterm AB Living  1 1 1     1   SAB TAB Ectopic Multiple Live Births          1    # Outcome Date GA Lbr Len/2nd Weight Sex Delivery Anes PTL Lv  1 Term 2015   7 lb 8 oz (3.402 kg) F CS-LTranv   LIV    Past Medical History:  Diagnosis Date  . ADHD   . Anxiety   . Diabetes mellitus without complication (HCC)   . STD (sexually transmitted disease)    chlaymdia- 10 years ago    Past Surgical History:  Procedure Laterality Date  . CESAREAN SECTION      Current Outpatient Medications on File Prior to Visit  Medication Sig Dispense Refill  . atomoxetine (STRATTERA) 25 MG capsule Take 25 mg by mouth daily.    . clonazePAM (KLONOPIN) 0.5 MG tablet Take 1 tablet by mouth 2 (two) times daily as needed.   0  . glucagon (GLUCAGON EMERGENCY) 1 MG injection 1 mg.    Marland Kitchen. glucose blood (BAYER CONTOUR NEXT TEST) test strip CHECK BLOOD SUGARS 8 X A DAY, and lancets 8/day 900 each 3  . glucose blood (CONTOUR NEXT TEST) test strip CHECK BLOOD SUGARS 8 X A DAY, AND LANCETS 8/DAY 300 each 3  . hydrOXYzine (ATARAX/VISTARIL) 10 MG tablet Take by  mouth.    . insulin aspart (NOVOLOG) 100 UNIT/ML injection USE VIA INSULIN PUMP. MAX OF 80 UNITS PER DAY. 30 mL 11  . Insulin Infusion Pump Supplies (MINIMED INFUSION SET-MMT 396) MISC 1 Device by Does not apply route every 3 (three) days. 30 each 3  . Insulin Infusion Pump Supplies (PARADIGM PUMP RESERVOIR 1.8ML) MISC 1 Device by Does not apply route every 3 (three) days. 30 each 3  . lisdexamfetamine (VYVANSE) 40 MG capsule Take 40 mg by mouth every morning.     No current facility-administered medications on file prior to visit.     Allergies  Allergen Reactions  . Penicillins Anaphylaxis  . Amoxicillin Other (See Comments)    Social History   Socioeconomic History  . Marital status: Married    Spouse name: Not on file  . Number of children: Not on file  . Years of education: Not on file  . Highest education level: Not on file  Occupational History  . Not on file  Social Needs  . Financial resource strain: Not on file  . Food insecurity:    Worry: Not on file    Inability: Not on file  . Transportation needs:    Medical: Not on file  Non-medical: Not on file  Tobacco Use  . Smoking status: Never Smoker  . Smokeless tobacco: Never Used  Substance and Sexual Activity  . Alcohol use: Yes    Comment: rare  . Drug use: No  . Sexual activity: Yes    Birth control/protection: IUD  Lifestyle  . Physical activity:    Days per week: 0 days    Minutes per session: 0 min  . Stress: Not on file  Relationships  . Social connections:    Talks on phone: Not on file    Gets together: Not on file    Attends religious service: Not on file    Active member of club or organization: Not on file    Attends meetings of clubs or organizations: Not on file    Relationship status: Not on file  . Intimate partner violence:    Fear of current or ex partner: No    Emotionally abused: No    Physically abused: No    Forced sexual activity: No  Other Topics Concern  . Not on file   Social History Narrative  . Not on file    Family History  Problem Relation Age of Onset  . Breast cancer Neg Hx   . Ovarian cancer Neg Hx   . Colon cancer Neg Hx   . Diabetes Neg Hx   . Heart disease Neg Hx   . Diabetes gravidarum Neg Hx   . Diabetes type I Neg Hx     The following portions of the patient's history were reviewed and updated as appropriate: allergies, current medications, past family history, past medical history, past social history, past surgical history and problem list.  Review of Systems Review of Systems  Constitutional: Negative.   Respiratory: Negative.   Cardiovascular: Negative.   Gastrointestinal: Negative.   Genitourinary: Negative.        Amenorrhea with IUD  Musculoskeletal: Negative.   Skin: Negative.   All other systems reviewed and are negative.     Objective:   BP 117/80   Pulse (!) 101   Ht 5' (1.524 m)   Wt 178 lb 14.4 oz (81.1 kg)   BMI 34.94 kg/m  CONSTITUTIONAL: Well-developed, well-nourished female in no acute distress.  PSYCHIATRIC: Normal mood and affect. Normal behavior. Normal judgment and thought content. NEUROLGIC: Alert and oriented to person, place, and time. Normal muscle tone coordination. No cranial nerve deficit noted. HENT:  Normocephalic, atraumatic EYES: Conjunctivae and EOM are normal.  No scleral icterus.  NECK: Normal range of motion, supple, no masses.  Normal thyroid.  SKIN: Skin is warm and dry. No rash noted. Not diaphoretic. No erythema. No pallor. CARDIOVASCULAR: Normal heart rate noted, regular rhythm, no murmur. RESPIRATORY: Clear to auscultation bilaterally. Effort and breath sounds normal, no problems with respiration noted. BREASTS: Symmetric in size. No masses, skin changes, nipple drainage, or lymphadenopathy. ABDOMEN: Soft, normal bowel sounds, no distention noted.  No tenderness, rebound or guarding.  BLADDER: Normal PELVIC:  External Genitalia: Normal  BUS: Normal  Vagina: Normal; no  significant discharge  Cervix: Normal; IUD string visualized-3-1/2 cm, IUD removed; no cervical motion tenderness  Uterus: Normal; midplane, normal size and shape, mobile, nontender  Adnexa: Normal; nonpalpable and nontender  RV: External Exam NormaI  MUSCULOSKELETAL: Normal range of motion. No tenderness.  No cyanosis, clubbing, or edema.  2+ distal pulses. LYMPHATIC: No Axillary, Supraclavicular, or Inguinal Adenopathy.  PROCEDURE: IUD removal Indications: Personal choice; desires conception Description: Patient is placed in dorsolithotomy position.  She verbalizes consent for IUD removal.  Graves speculum was placed into the vagina to facilitate visualization of the cervix.  The Mirena IUD strings are identified and pulled with [place] forceps with gentle traction to remove IUD.  Procedure was well-tolerated.  Complications are none.  Assessment:   Annual gynecologic examination 27 y.o. Contraception: IUD BMI 34 Type 1 diabetes mellitus, on insulin pump with labile blood sugars History of cesarean section ADHD Spotting with IUD in place, monthly; desires IUD removal today  Plan:  Pap: Due 2020 Mammogram: Not Indicated Stool Guaiac Testing:  Not Indicated Labs: Lipid 1, TSH and Vit D Level"". Routine preventative health maintenance measures emphasized: Exercise/Diet/Weight control, Tobacco Warnings and Alcohol/Substance use risks  Contraception-I none; IUD is removed today Prenatal vitamins daily Return to Clinic - 1 Year-Dr. Lynford Humphrey, CMA   Britt Bottom CMA acting as scribe for Dr. Greggory Keen. I have reviewed, updated, and concur with scribed information. Herold Harms, MD   Note: This dictation was prepared with Dragon dictation along with smaller phrase technology. Any transcriptional errors that result from this process are unintentional.

## 2018-09-08 ENCOUNTER — Other Ambulatory Visit: Payer: Self-pay | Admitting: Endocrinology

## 2018-09-11 ENCOUNTER — Encounter: Payer: Self-pay | Admitting: Obstetrics and Gynecology

## 2018-09-11 ENCOUNTER — Ambulatory Visit (INDEPENDENT_AMBULATORY_CARE_PROVIDER_SITE_OTHER): Payer: BLUE CROSS/BLUE SHIELD | Admitting: Obstetrics and Gynecology

## 2018-09-11 VITALS — BP 117/80 | HR 101 | Ht 60.0 in | Wt 178.9 lb

## 2018-09-11 DIAGNOSIS — E109 Type 1 diabetes mellitus without complications: Secondary | ICD-10-CM

## 2018-09-11 DIAGNOSIS — Z01419 Encounter for gynecological examination (general) (routine) without abnormal findings: Secondary | ICD-10-CM | POA: Diagnosis not present

## 2018-09-11 DIAGNOSIS — E669 Obesity, unspecified: Secondary | ICD-10-CM

## 2018-09-11 DIAGNOSIS — Z98891 History of uterine scar from previous surgery: Secondary | ICD-10-CM

## 2018-09-11 DIAGNOSIS — Z975 Presence of (intrauterine) contraceptive device: Secondary | ICD-10-CM | POA: Diagnosis not present

## 2018-09-11 DIAGNOSIS — Z30432 Encounter for removal of intrauterine contraceptive device: Secondary | ICD-10-CM

## 2018-09-11 NOTE — Patient Instructions (Addendum)
1.  No Pap smear is done.  Next Pap smear is due 2020. 2.  Breast self-awareness is encouraged. 3.  Screening labs are ordered. 4.  Contraception-Mirena IUD removed today 5.  Continue with healthy eating and exercise with control weight loss. 6.  Recommend prenatal vitamin or multivitamin daily during attempt at conception. 7.  Return in 1 year for annual exam-Dr. [place] Maintenance, Female Adopting a healthy lifestyle and getting preventive care can go a long way to promote health and wellness. Talk with your health care provider about what schedule of regular examinations is right for you. This is a good chance for you to check in with your provider about disease prevention and staying healthy. In between checkups, there are plenty of things you can do on your own. Experts have done a lot of research about which lifestyle changes and preventive measures are most likely to keep you healthy. Ask your health care provider for more information. Weight and diet Eat a healthy diet  Be sure to include plenty of vegetables, fruits, low-fat dairy products, and lean protein.  Do not eat a lot of foods high in solid fats, added sugars, or salt.  Get regular exercise. This is one of the most important things you can do for your health. ? Most adults should exercise for at least 150 minutes each week. The exercise should increase your heart rate and make you sweat (moderate-intensity exercise). ? Most adults should also do strengthening exercises at least twice a week. This is in addition to the moderate-intensity exercise.  Maintain a healthy weight  Body mass index (BMI) is a measurement that can be used to identify possible weight problems. It estimates body fat based on height and weight. Your health care provider can help determine your BMI and help you achieve or maintain a healthy weight.  For females 74 years of age and older: ? A BMI below 18.5 is considered underweight. ? A BMI of  18.5 to 24.9 is normal. ? A BMI of 25 to 29.9 is considered overweight. ? A BMI of 30 and above is considered obese.  Watch levels of cholesterol and blood lipids  You should start having your blood tested for lipids and cholesterol at 29 years of age, then have this test every 5 years.  You may need to have your cholesterol levels checked more often if: ? Your lipid or cholesterol levels are high. ? You are older than 29 years of age. ? You are at high risk for heart disease.  Cancer screening Lung Cancer  Lung cancer screening is recommended for adults 62-79 years old who are at high risk for lung cancer because of a history of smoking.  A yearly low-dose CT scan of the lungs is recommended for people who: ? Currently smoke. ? Have quit within the past 15 years. ? Have at least a 30-pack-year history of smoking. A pack year is smoking an average of one pack of cigarettes a day for 1 year.  Yearly screening should continue until it has been 15 years since you quit.  Yearly screening should stop if you develop a health problem that would prevent you from having lung cancer treatment.  Breast Cancer  Practice breast self-awareness. This means understanding how your breasts normally appear and feel.  It also means doing regular breast self-exams. Let your health care provider know about any changes, no matter how small.  If you are in your 20s or 30s, you should have  a clinical breast exam (CBE) by a health care provider every 1-3 years as part of a regular health exam.  If you are 59 or older, have a CBE every year. Also consider having a breast X-ray (mammogram) every year.  If you have a family history of breast cancer, talk to your health care provider about genetic screening.  If you are at high risk for breast cancer, talk to your health care provider about having an MRI and a mammogram every year.  Breast cancer gene (BRCA) assessment is recommended for women who have  family members with BRCA-related cancers. BRCA-related cancers include: ? Breast. ? Ovarian. ? Tubal. ? Peritoneal cancers.  Results of the assessment will determine the need for genetic counseling and BRCA1 and BRCA2 testing.  Cervical Cancer Your health care provider may recommend that you be screened regularly for cancer of the pelvic organs (ovaries, uterus, and vagina). This screening involves a pelvic examination, including checking for microscopic changes to the surface of your cervix (Pap test). You may be encouraged to have this screening done every 3 years, beginning at age 70.  For women ages 45-65, health care providers may recommend pelvic exams and Pap testing every 3 years, or they may recommend the Pap and pelvic exam, combined with testing for human papilloma virus (HPV), every 5 years. Some types of HPV increase your risk of cervical cancer. Testing for HPV may also be done on women of any age with unclear Pap test results.  Other health care providers may not recommend any screening for nonpregnant women who are considered low risk for pelvic cancer and who do not have symptoms. Ask your health care provider if a screening pelvic exam is right for you.  If you have had past treatment for cervical cancer or a condition that could lead to cancer, you need Pap tests and screening for cancer for at least 20 years after your treatment. If Pap tests have been discontinued, your risk factors (such as having a new sexual partner) need to be reassessed to determine if screening should resume. Some women have medical problems that increase the chance of getting cervical cancer. In these cases, your health care provider may recommend more frequent screening and Pap tests.  Colorectal Cancer  This type of cancer can be detected and often prevented.  Routine colorectal cancer screening usually begins at 29 years of age and continues through 29 years of age.  Your health care provider may  recommend screening at an earlier age if you have risk factors for colon cancer.  Your health care provider may also recommend using home test kits to check for hidden blood in the stool.  A small camera at the end of a tube can be used to examine your colon directly (sigmoidoscopy or colonoscopy). This is done to check for the earliest forms of colorectal cancer.  Routine screening usually begins at age 12.  Direct examination of the colon should be repeated every 5-10 years through 29 years of age. However, you may need to be screened more often if early forms of precancerous polyps or small growths are found.  Skin Cancer  Check your skin from head to toe regularly.  Tell your health care provider about any new moles or changes in moles, especially if there is a change in a mole's shape or color.  Also tell your health care provider if you have a mole that is larger than the size of a pencil eraser.  Always use  sunscreen. Apply sunscreen liberally and repeatedly throughout the day.  Protect yourself by wearing long sleeves, pants, a wide-brimmed hat, and sunglasses whenever you are outside.  Heart disease, diabetes, and high blood pressure  High blood pressure causes heart disease and increases the risk of stroke. High blood pressure is more likely to develop in: ? People who have blood pressure in the high end of the normal range (130-139/85-89 mm Hg). ? People who are overweight or obese. ? People who are African American.  If you are 56-58 years of age, have your blood pressure checked every 3-5 years. If you are 16 years of age or older, have your blood pressure checked every year. You should have your blood pressure measured twice-once when you are at a hospital or clinic, and once when you are not at a hospital or clinic. Record the average of the two measurements. To check your blood pressure when you are not at a hospital or clinic, you can use: ? An automated blood pressure  machine at a pharmacy. ? A home blood pressure monitor.  If you are between 39 years and 74 years old, ask your health care provider if you should take aspirin to prevent strokes.  Have regular diabetes screenings. This involves taking a blood sample to check your fasting blood sugar level. ? If you are at a normal weight and have a low risk for diabetes, have this test once every three years after 29 years of age. ? If you are overweight and have a high risk for diabetes, consider being tested at a younger age or more often. Preventing infection Hepatitis B  If you have a higher risk for hepatitis B, you should be screened for this virus. You are considered at high risk for hepatitis B if: ? You were born in a country where hepatitis B is common. Ask your health care provider which countries are considered high risk. ? Your parents were born in a high-risk country, and you have not been immunized against hepatitis B (hepatitis B vaccine). ? You have HIV or AIDS. ? You use needles to inject street drugs. ? You live with someone who has hepatitis B. ? You have had sex with someone who has hepatitis B. ? You get hemodialysis treatment. ? You take certain medicines for conditions, including cancer, organ transplantation, and autoimmune conditions.  Hepatitis C  Blood testing is recommended for: ? Everyone born from [birth year] through 1967. ? Anyone with known risk factors for hepatitis C.  Sexually transmitted infections (STIs)  You should be screened for sexually transmitted infections (STIs) including gonorrhea and chlamydia if: ? You are sexually active and are younger than 29 years of age. ? You are older than 29 years of age and your health care provider tells you that you are at risk for this type of infection. ? Your sexual activity has changed since you were last screened and you are at an increased risk for chlamydia or gonorrhea. Ask your health care provider if you are at  risk.  If you do not have HIV, but are at risk, it may be recommended that you take a prescription medicine daily to prevent HIV infection. This is called pre-exposure prophylaxis (PrEP). You are considered at risk if: ? You are sexually active and do not regularly use condoms or know the HIV status of your partner(s). ? You take drugs by injection. ? You are sexually active with a partner who has HIV.  Talk with your health  care provider about whether you are at high risk of being infected with HIV. If you choose to begin PrEP, you should first be tested for HIV. You should then be tested every 3 months for as long as you are taking PrEP. Pregnancy  If you are premenopausal and you may become pregnant, ask your health care provider about preconception counseling.  If you may become pregnant, take 400 to 800 micrograms (mcg) of folic acid every day.  If you want to prevent pregnancy, talk to your health care provider about birth control (contraception). Osteoporosis and menopause  Osteoporosis is a disease in which the bones lose minerals and strength with aging. This can result in serious bone fractures. Your risk for osteoporosis can be identified using a bone density scan.  If you are 54 years of age or older, or if you are at risk for osteoporosis and fractures, ask your health care provider if you should be screened.  Ask your health care provider whether you should take a calcium or vitamin D supplement to lower your risk for osteoporosis.  Menopause may have certain physical symptoms and risks.  Hormone replacement therapy may reduce some of these symptoms and risks. Talk to your health care provider about whether hormone replacement therapy is right for you. Follow these instructions at home:  Schedule regular health, dental, and eye exams.  Stay current with your immunizations.  Do not use any tobacco products including cigarettes, chewing tobacco, or electronic  cigarettes.  If you are pregnant, do not drink alcohol.  If you are breastfeeding, limit how much and how often you drink alcohol.  Limit alcohol intake to no more than 1 drink per day for nonpregnant women. One drink equals 12 ounces of beer, 5 ounces of wine, or 1 ounces of hard liquor.  Do not use street drugs.  Do not share needles.  Ask your health care provider for help if you need support or information about quitting drugs.  Tell your health care provider if you often feel depressed.  Tell your health care provider if you have ever been abused or do not feel safe at home. This information is not intended to replace advice given to you by your health care provider. Make sure you discuss any questions you have with your health care provider. Document Released: 04/04/2011 Document Revised: 02/25/2016 Document Reviewed: 06/23/2015 Elsevier Interactive Patient Education  [employer].

## 2018-09-12 ENCOUNTER — Other Ambulatory Visit: Payer: BLUE CROSS/BLUE SHIELD

## 2018-09-12 DIAGNOSIS — E109 Type 1 diabetes mellitus without complications: Secondary | ICD-10-CM | POA: Diagnosis not present

## 2018-09-12 DIAGNOSIS — Z01419 Encounter for gynecological examination (general) (routine) without abnormal findings: Secondary | ICD-10-CM | POA: Diagnosis not present

## 2018-09-12 DIAGNOSIS — E669 Obesity, unspecified: Secondary | ICD-10-CM | POA: Diagnosis not present

## 2018-09-12 NOTE — Addendum Note (Signed)
Addended by: Dorian PodWHEELEY, Jyaire Koudelka J on: 09/12/2018 08:23 AM   Modules accepted: Orders

## 2018-09-13 LAB — LIPID PANEL
Chol/HDL Ratio: 2.6 ratio (ref 0.0–4.4)
Cholesterol, Total: 150 mg/dL (ref 100–199)
HDL: 57 mg/dL (ref >39)
LDL Calculated: 83 mg/dL (ref 0–99)
Triglycerides: 49 mg/dL (ref 0–149)
VLDL CHOLESTEROL CAL: 10 mg/dL (ref 5–40)

## 2018-09-13 LAB — HEMOGLOBIN A1C
ESTIMATED AVERAGE GLUCOSE: 197 mg/dL
HEMOGLOBIN A1C: 8.5 % — AB (ref 4.8–5.6)

## 2018-09-13 LAB — TSH: TSH: 2.64 u[IU]/mL (ref 0.450–4.500)

## 2018-09-13 LAB — GLUCOSE, RANDOM: Glucose: 156 mg/dL — ABNORMAL HIGH (ref 65–99)

## 2018-09-18 ENCOUNTER — Encounter: Payer: Self-pay | Admitting: Endocrinology

## 2018-09-18 ENCOUNTER — Ambulatory Visit (INDEPENDENT_AMBULATORY_CARE_PROVIDER_SITE_OTHER): Payer: BLUE CROSS/BLUE SHIELD | Admitting: Endocrinology

## 2018-09-18 VITALS — BP 118/82 | HR 119 | Ht 60.0 in | Wt 178.2 lb

## 2018-09-18 DIAGNOSIS — E109 Type 1 diabetes mellitus without complications: Secondary | ICD-10-CM | POA: Diagnosis not present

## 2018-09-18 NOTE — Progress Notes (Signed)
Subjective:    Patient ID: Jordan Leonard, female    DOB: 12/07/1990, 27 y.o.   MRN: [id number]  HPI Pt returns for f/u of diabetes mellitus:  DM type: 1 Dx'ed: 8144 Complications: none Therapy: insulin since dx, and pump rx since 2005 (Omnipod since 2019) GDM: never.  DKA: only once, at dx Severe hypoglycemia: never Pancreatitis: never Pancreatic imaging: never.  Other: she uses Freestyle Libre continuous glucose monitor; she is not at risk for pregnancy now.  Interval history:  She takes these pump settings:  Has omnipod and frest lib basal rate of 1.1 units/hr, except for 1.4 units/hr, 3 AM to 6 AM.   mealtime bolus of 1 unit/7 grams carbohydrate at all times of day.   correction bolus (which some people call "sensitivity," or "insulin sensitivity ratio," or just "isr") of 1 unit for each 45 by which your glucose exceeds 100.  TDD is 35 units I reviewed continuous glucose monitor data.  Glucose varies from 80-310.  It is in general higher as the day goes on.  Past Medical History:  Diagnosis Date  . ADHD   . Anxiety   . Diabetes mellitus without complication ([place])   . STD (sexually transmitted disease)    chlaymdia- 10 years ago    Past Surgical History:  Procedure Laterality Date  . CESAREAN SECTION      Social History   Socioeconomic History  . Marital status: Married    Spouse name: Not on file  . Number of children: Not on file  . Years of education: Not on file  . Highest education level: Not on file  Occupational History  . Not on file  Social Needs  . Financial resource strain: Not on file  . Food insecurity:    Worry: Not on file    Inability: Not on file  . Transportation needs:    Medical: Not on file    Non-medical: Not on file  Tobacco Use  . Smoking status: Never Smoker  . Smokeless tobacco: Never Used  Substance and Sexual Activity  . Alcohol use: Yes    Comment: rare  . Drug use: No  . Sexual activity: Yes    Birth  control/protection: I.U.D.  Lifestyle  . Physical activity:    Days per week: 0 days    Minutes per session: 0 min  . Stress: Not on file  Relationships  . Social connections:    Talks on phone: Not on file    Gets together: Not on file    Attends religious service: Not on file    Active member of club or organization: Not on file    Attends meetings of clubs or organizations: Not on file    Relationship status: Not on file  . Intimate partner violence:    Fear of current or ex partner: No    Emotionally abused: No    Physically abused: No    Forced sexual activity: No  Other Topics Concern  . Not on file  Social History Narrative  . Not on file    Current Outpatient Medications on File Prior to Visit  Medication Sig Dispense Refill  . atomoxetine (STRATTERA) 25 MG capsule Take 25 mg by mouth daily.    . clonazePAM (KLONOPIN) 0.5 MG tablet Take 1 tablet by mouth 2 (two) times daily as needed.   0  . CONTOUR NEXT TEST test strip CHECK BLOOD SUGARS 8 X A DAY, AND LANCETS 8/DAY 240 each 11  .  glucagon (GLUCAGON EMERGENCY) 1 MG injection 1 mg.    Marland Kitchen glucose blood (CONTOUR NEXT TEST) test strip CHECK BLOOD SUGARS 8 X A DAY, AND LANCETS 8/DAY 300 each 3  . hydrOXYzine (ATARAX/VISTARIL) 10 MG tablet Take by mouth.    . Insulin Disposable Pump (OMNIPOD DASH SYSTEM) KIT by Does not apply route.    . insulin aspart (NOVOLOG) 100 UNIT/ML injection USE VIA INSULIN PUMP. MAX OF 80 UNITS PER DAY. 30 mL 11  . Insulin Infusion Pump Supplies (MINIMED INFUSION SET-MMT 396) MISC 1 Device by Does not apply route every 3 (three) days. (Patient not taking: Reported on 09/18/2018) 30 each 3  . Insulin Infusion Pump Supplies (PARADIGM PUMP RESERVOIR 1.8ML) MISC 1 Device by Does not apply route every 3 (three) days. (Patient not taking: Reported on 09/18/2018) 30 each 3   No current facility-administered medications on file prior to visit.     Allergies  Allergen Reactions  . Penicillins Anaphylaxis   . Amoxicillin Other (See Comments)    Family History  Problem Relation Age of Onset  . Breast cancer Neg Hx   . Ovarian cancer Neg Hx   . Colon cancer Neg Hx   . Diabetes Neg Hx   . Heart disease Neg Hx   . Diabetes gravidarum Neg Hx   . Diabetes type I Neg Hx     BP 118/82 (BP Location: Left Arm, Patient Position: Sitting, Cuff Size: Normal)   Pulse (!) 119   Ht 5' (1.524 m)   Wt 178 lb 3.2 oz (80.8 kg)   SpO2 92%   BMI 34.80 kg/m   Review of Systems She denies hypoglycemia.      Objective:   Physical Exam VITAL SIGNS:  See vs page GENERAL: no distress Pulses: dorsalis pedis intact bilat.   MSK: no deformity of the feet CV: no leg edema Skin:  no ulcer on the feet.  normal color and temp on the feet. Neuro: sensation is intact to touch on the feet    Lab Results  Component Value Date   HGBA1C 8.5 (H) 09/12/2018   Lab Results  Component Value Date   TSH 2.640 09/12/2018      Assessment & Plan:  Type 1 DM: Based on the pattern of her cbg's, she needs some adjustment in her therapy Tachycardia: she is euthyroid.  Recheck next time.    Patient Instructions  check your blood sugar 8 times a day.  vary the time of day when you check, between before the 3 meals, and at bedtime.  also check if you have symptoms of your blood sugar being too high or too low.  please keep a record of the readings and bring it to your next appointment here (or you can bring the meter itself).  You can write it on any piece of paper.  please call us sooner if your blood sugar goes below 70, or if you have a lot of readings over 200.  Please continue these settings, when you get your Omnipods:  basal rate of 1 unit/hr, 24 HRS per day.   mealtime bolus of 1 unit/5 grams carbohydrate.   correction bolus (which some people call "sensitivity," or "insulin sensitivity ratio," or just "isr") of 1 unit for each 70 by which your glucose exceeds 100.   If you want, we can consider adding  "Trulicity," or "Ozempic."  Please come back for a follow-up appointment in 1 month.

## 2018-09-18 NOTE — Patient Instructions (Addendum)
check your blood sugar 8 times a day.  vary the time of day when you check, between before the 3 meals, and at bedtime.  also check if you have symptoms of your blood sugar being too high or too low.  please keep a record of the readings and bring it to your next appointment here (or you can bring the meter itself).  You can write it on any piece of paper.  please call us sooner if your blood sugar goes below 70, or if you have a lot of readings over 200.  Please continue these settings, when you get your Omnipods:  basal rate of 1 unit/hr, 24 HRS per day.   mealtime bolus of 1 unit/5 grams carbohydrate.   correction bolus (which some people call "sensitivity," or "insulin sensitivity ratio," or just "isr") of 1 unit for each 70 by which your glucose exceeds 100.   If you want, we can consider adding "Trulicity," or "Ozempic."  Please come back for a follow-up appointment in 1 month.

## 2018-09-21 DIAGNOSIS — E109 Type 1 diabetes mellitus without complications: Secondary | ICD-10-CM | POA: Diagnosis not present

## 2018-09-24 ENCOUNTER — Ambulatory Visit (INDEPENDENT_AMBULATORY_CARE_PROVIDER_SITE_OTHER): Payer: BLUE CROSS/BLUE SHIELD | Admitting: Podiatry

## 2018-09-24 ENCOUNTER — Encounter: Payer: Self-pay | Admitting: Podiatry

## 2018-09-24 DIAGNOSIS — B070 Plantar wart: Secondary | ICD-10-CM

## 2018-09-24 NOTE — Progress Notes (Signed)
Subjective:  Patient ID: Jordan Leonard, female    DOB: 1991/09/06,  MRN: [id number] HPI Chief Complaint  Patient presents with  . Callouses    Patient presents today for possible plantar wart bottom of right 2nd toe x years, recently became worse, painful to walk on spot.  She also reports that her whole rt 2nd toe can be sore and painful to walk on at times.  No treatment done  . Toe Pain    27 y.o. female presents with the above complaint.   ROS: Denies fever chills nausea vomiting muscle aches pains calf pain back pain chest pain shortness of breath.  Past Medical History:  Diagnosis Date  . ADHD   . Anxiety   . Diabetes mellitus without complication ([place])   . STD (sexually transmitted disease)    chlaymdia- 10 years ago   Past Surgical History:  Procedure Laterality Date  . CESAREAN SECTION      Current Outpatient Medications:  .  atomoxetine (STRATTERA) 60 MG capsule, Take 60 mg by mouth daily., Disp: , Rfl:  .  clonazePAM (KLONOPIN) 0.5 MG tablet, Take 1 tablet by mouth 2 (two) times daily as needed. , Disp: , Rfl: 0 .  CONTOUR NEXT TEST test strip, CHECK BLOOD SUGARS 8 X A DAY, AND LANCETS 8/DAY, Disp: 240 each, Rfl: 11 .  glucagon (GLUCAGON EMERGENCY) 1 MG injection, 1 mg., Disp: , Rfl:  .  glucose blood (CONTOUR NEXT TEST) test strip, CHECK BLOOD SUGARS 8 X A DAY, AND LANCETS 8/DAY, Disp: 300 each, Rfl: 3 .  hydrOXYzine (ATARAX/VISTARIL) 10 MG tablet, Take by mouth., Disp: , Rfl:  .  insulin aspart (NOVOLOG) 100 UNIT/ML injection, USE VIA INSULIN PUMP. Makari Sanko OF 80 UNITS PER DAY., Disp: 30 mL, Rfl: 11 .  Insulin Disposable Pump (OMNIPOD DASH SYSTEM) KIT, by Does not apply route., Disp: , Rfl:  .  Insulin Infusion Pump Supplies (MINIMED INFUSION SET-MMT 396) MISC, 1 Device by Does not apply route every 3 (three) days. (Patient not taking: Reported on 09/18/2018), Disp: 30 each, Rfl: 3 .  Insulin Infusion Pump Supplies (PARADIGM PUMP RESERVOIR 1.8ML) MISC, 1 Device by  Does not apply route every 3 (three) days. (Patient not taking: Reported on 09/18/2018), Disp: 30 each, Rfl: 3  Allergies  Allergen Reactions  . Penicillins Anaphylaxis  . Amoxicillin Other (See Comments)   Review of Systems Objective:  There were no vitals filed for this visit.  General: Well developed, nourished, in no acute distress, alert and oriented x3   Dermatological: Skin is warm, dry and supple bilateral. Nails x 10 are well maintained; remaining integument appears unremarkable at this time. There are no open sores, no preulcerative lesions, no rash or signs of infection present.  Mildly swollen tuft of the second toe right foot with a small area that appears to be nucleated and very well could be verrucoid in nature.  She does relate warts in the past.  The tuft of the toe feels almost cystic as in a foreign body reaction or a possible deep wart.  Vascular: Dorsalis Pedis artery and Posterior Tibial artery pedal pulses are 2/4 bilateral with immedate capillary fill time. Pedal hair growth present. No varicosities and no lower extremity edema present bilateral.   Neruologic: Grossly intact via light touch bilateral. Vibratory intact via tuning fork bilateral. Protective threshold with Semmes Wienstein monofilament intact to all pedal sites bilateral. Patellar and Achilles deep tendon reflexes 2+ bilateral. No Babinski or clonus noted bilateral.  Musculoskeletal: No gross boney pedal deformities bilateral. No pain, crepitus, or limitation noted with foot and ankle range of motion bilateral. Muscular strength 5/5 in all groups tested bilateral.  Gait: Unassisted, Nonantalgic.    Radiographs:  None taken secondary to her possible pregnancy.  Assessment & Plan:   Assessment: Cannot rule out a verruca plantaris right foot yet.  Cannot rule out foreign body reaction.  Plan: Place a small amount of Cantharone under occlusion to be washed off thoroughly tomorrow morning.  If she is  not pregnant next visit and there is no improvement then we will try x-rays at that time.     Summit Borchardt T. [place], [place]

## 2018-10-02 DIAGNOSIS — E109 Type 1 diabetes mellitus without complications: Secondary | ICD-10-CM | POA: Diagnosis not present

## 2018-10-03 HISTORY — PX: TOE SURGERY: SHX1073

## 2018-10-05 ENCOUNTER — Encounter: Payer: BLUE CROSS/BLUE SHIELD | Admitting: Obstetrics and Gynecology

## 2018-10-09 DIAGNOSIS — F900 Attention-deficit hyperactivity disorder, predominantly inattentive type: Secondary | ICD-10-CM | POA: Diagnosis not present

## 2018-10-09 DIAGNOSIS — F411 Generalized anxiety disorder: Secondary | ICD-10-CM | POA: Diagnosis not present

## 2018-10-19 ENCOUNTER — Encounter: Payer: BLUE CROSS/BLUE SHIELD | Admitting: Obstetrics and Gynecology

## 2018-10-22 ENCOUNTER — Ambulatory Visit (INDEPENDENT_AMBULATORY_CARE_PROVIDER_SITE_OTHER): Payer: BLUE CROSS/BLUE SHIELD

## 2018-10-22 ENCOUNTER — Ambulatory Visit (INDEPENDENT_AMBULATORY_CARE_PROVIDER_SITE_OTHER): Payer: BLUE CROSS/BLUE SHIELD | Admitting: Podiatry

## 2018-10-22 ENCOUNTER — Encounter: Payer: Self-pay | Admitting: Podiatry

## 2018-10-22 DIAGNOSIS — S90851A Superficial foreign body, right foot, initial encounter: Secondary | ICD-10-CM | POA: Diagnosis not present

## 2018-10-22 DIAGNOSIS — M67471 Ganglion, right ankle and foot: Secondary | ICD-10-CM

## 2018-10-22 NOTE — Progress Notes (Signed)
She presents today for follow-up of pain to the second digit of the right foot.  She is concerned because she is diabetic and it has not improved.  She denies fever chills nausea vomiting muscle aches pain states that is so tender she can hardly walk on it.  Objective: Vital signs are stable alert and oriented x3.  It appears to be at like a firm cyst in the tuft of the toe.  Radiographs taken today looking for possible foreign bodies demonstrate no foreign body.  However I am unable to see any type of verrucoid lesion.  This does feel firm as if it is a cyst coming from the plantar aspect of the DIPJ.  Assessment: Some type of cyst in the toe of this diabetic.  This is painful in nature second digit right foot.  Plan: Requesting MRI for aspiration or

## 2018-10-25 ENCOUNTER — Telehealth: Payer: Self-pay

## 2018-10-25 DIAGNOSIS — M67471 Ganglion, right ankle and foot: Secondary | ICD-10-CM

## 2018-10-25 NOTE — Telephone Encounter (Signed)
Per Sela Hua ([facility]), MRI was approved from 10/25/2018 to 11/23/2018 Auth# [id number] Patient has been notified of approval and will come by office to pick up script for kidney function test and she will call scheduling to set up appt to her convenience.

## 2018-10-25 NOTE — Telephone Encounter (Signed)
-----   Message from Kristian Coveyshley E Prevette, [facility] sent at 10/23/2018  9:08 AM EST ----- Regarding: MRI MRI right foot with contrast - evaluate 2nd toe right, cyst at tuft of toe, painful - surgical consideration

## 2018-10-31 ENCOUNTER — Encounter: Payer: Self-pay | Admitting: Endocrinology

## 2018-10-31 ENCOUNTER — Ambulatory Visit: Payer: BLUE CROSS/BLUE SHIELD | Admitting: Obstetrics and Gynecology

## 2018-10-31 ENCOUNTER — Encounter: Payer: Self-pay | Admitting: Obstetrics and Gynecology

## 2018-10-31 ENCOUNTER — Ambulatory Visit (INDEPENDENT_AMBULATORY_CARE_PROVIDER_SITE_OTHER): Payer: BLUE CROSS/BLUE SHIELD | Admitting: Endocrinology

## 2018-10-31 VITALS — BP 138/80 | HR 115 | Ht 60.5 in | Wt 181.4 lb

## 2018-10-31 VITALS — BP 117/87 | HR 90 | Ht 60.5 in | Wt 180.2 lb

## 2018-10-31 DIAGNOSIS — Z3169 Encounter for other general counseling and advice on procreation: Secondary | ICD-10-CM | POA: Diagnosis not present

## 2018-10-31 DIAGNOSIS — R000 Tachycardia, unspecified: Secondary | ICD-10-CM

## 2018-10-31 DIAGNOSIS — M67471 Ganglion, right ankle and foot: Secondary | ICD-10-CM | POA: Diagnosis not present

## 2018-10-31 DIAGNOSIS — E109 Type 1 diabetes mellitus without complications: Secondary | ICD-10-CM | POA: Diagnosis not present

## 2018-10-31 LAB — POCT GLYCOSYLATED HEMOGLOBIN (HGB A1C): Hemoglobin A1C: 8.1 % — AB (ref 4.0–5.6)

## 2018-10-31 NOTE — Progress Notes (Signed)
HPI:      Jordan Leonard is a 28 y.o. G1P1001 who LMP was Jordan Leonard's last menstrual period was 10/25/2018.  Subjective:   She presents today for preconceptual counseling.  She previously had a primary cesarean delivery at 37 weeks for insulin-dependent diabetes and fetal indications.  She is strongly considering a repeat cesarean delivery. Since that time she has been diagnosed with ADHD and takes Strattera, occasional Klonopin, Vyvanse and hydroxyzine. She is also a type I diabetic on NovoLog.    Hx: The following portions of the Jordan Leonard's history were reviewed and updated as appropriate:             She  has a past medical history of ADHD, Anxiety, Diabetes mellitus without complication ([place]), and STD (sexually transmitted disease). She does not have any pertinent problems on file. She  has a past surgical history that includes Cesarean section. Her family history is not on file. She  reports that she has never smoked. She has never used smokeless tobacco. She reports current alcohol use. She reports that she does not use drugs. She has a current medication list which includes the following prescription(s): atomoxetine, clonazepam, glucagon, hydroxyzine, insulin aspart, omnipod dash system, vyvanse, contour next test, and glucose blood. She is allergic to penicillins and amoxicillin.       Review of Systems:  Review of Systems  Constitutional: Denied constitutional symptoms, night sweats, recent illness, fatigue, fever, insomnia and weight loss.  Eyes: Denied eye symptoms, eye pain, photophobia, vision change and visual disturbance.  Ears/Nose/Throat/Neck: Denied ear, nose, throat or neck symptoms, hearing loss, nasal discharge, sinus congestion and sore throat.  Cardiovascular: Denied cardiovascular symptoms, arrhythmia, chest pain/pressure, edema, exercise intolerance, orthopnea and palpitations.  Respiratory: Denied pulmonary symptoms, asthma, pleuritic pain, productive sputum,  cough, dyspnea and wheezing.  Gastrointestinal: Denied, gastro-esophageal reflux, melena, nausea and vomiting.  Genitourinary: Denied genitourinary symptoms including symptomatic vaginal discharge, pelvic relaxation issues, and urinary complaints.  Musculoskeletal: Denied musculoskeletal symptoms, stiffness, swelling, muscle weakness and myalgia.  Dermatologic: Denied dermatology symptoms, rash and scar.  Neurologic: Denied neurology symptoms, dizziness, headache, neck pain and syncope.  Psychiatric: Denied psychiatric symptoms, anxiety and depression.  Endocrine: Denied endocrine symptoms including hot flashes and night sweats.   Meds:   Current Outpatient Medications on File Prior to Visit  Medication Sig Dispense Refill  . atomoxetine (STRATTERA) 60 MG capsule Take 60 mg by mouth daily.    . clonazePAM (KLONOPIN) 0.5 MG tablet Take 1 tablet by mouth 2 (two) times daily as needed.   0  . glucagon (GLUCAGON EMERGENCY) 1 MG injection 1 mg.    . hydrOXYzine (ATARAX/VISTARIL) 10 MG tablet Take by mouth.    . insulin aspart (NOVOLOG) 100 UNIT/ML injection USE VIA INSULIN PUMP. MAX OF 80 UNITS PER DAY. 30 mL 11  . Insulin Disposable Pump (OMNIPOD DASH SYSTEM) KIT by Does not apply route.    Marland Kitchen VYVANSE 20 MG capsule     . CONTOUR NEXT TEST test strip CHECK BLOOD SUGARS 8 X A DAY, AND LANCETS 8/DAY 240 each 11  . glucose blood (CONTOUR NEXT TEST) test strip CHECK BLOOD SUGARS 8 X A DAY, AND LANCETS 8/DAY 300 each 3   No current facility-administered medications on file prior to visit.     Objective:     Vitals:   10/31/18 1140  BP: 117/87  Pulse: 90                Assessment:  G1P1001 Jordan Leonard Active Problem List   Diagnosis Date Noted  . Toe pain, left 08/15/2018  . Breast cyst, right 03/07/2017  . Diabetes mellitus ([place]) 09/01/2016  . History of cesarean section 09/01/2016  . Obesity (BMI 30.0-34.9) 09/01/2016  . Contraception, device intrauterine 09/01/2016  . ADHD  09/01/2016  . History of chlamydia infection 09/01/2016  . Presence of insulin pump 04/20/2015     1. Pre-conception counseling     Insulin-dependent diabetes  ADHD and anxiety  Prior cesarean delivery   Plan:            1.  We have discussed insulin-dependent diabetes management during pregnancy recommended sugar levels antenatal testing and early delivery in detail.  All of her questions have been answered.  2.  We discussed ADHD and her medications.  I explained to her that organogenesis during the first trimester is the most important part of pregnancy and that several of the medication she is taking have some risk in the first trimester although we cannot predict how high that risk is.  We discussed risks and benefits and what it would take to change medications or discontinue them especially during the first trimester.  Again all of her questions were answered.  3.  Previous cesarean delivery discussed VBAC and repeat cesarean delivery discussed Jordan Leonard thinks she wants a repeat cesarean delivery.  4.  Current control of sugars, prenatal vitamins, timing of intercourse and cycling discussed in detail Orders No orders of the defined types were placed in this encounter.   No orders of the defined types were placed in this encounter.     F/U  No follow-ups on file. I spent 35 minutes involved in the care of this Jordan Leonard of which greater than 50% was spent discussing the above issues 1 through 4.  All questions answered.  Finis Bud, M.D. 10/31/2018 12:29 PM

## 2018-10-31 NOTE — Progress Notes (Signed)
Subjective:    Patient ID: Jordan Leonard, female    DOB: 1991-05-31, 28 y.o.   MRN: [id number]  HPI Pt returns for f/u of diabetes mellitus:  DM type: 1 Dx'ed: 8676 Complications: none Therapy: insulin since dx, and pump rx since 2005 (Omnipod since 2019).   GDM: never.  DKA: only once, at dx Severe hypoglycemia: never Pancreatitis: never Pancreatic imaging: never.  Other: she uses Freestyle Libre continuous glucose monitor, and Onmipod pump; she is not at risk for pregnancy now.  Interval history:  She takes these pump settings: basal rate of 1.1 units/hr, 6 AM-9PM, and 1.25/HR at other times.    mealtime bolus of 1 unit/5 grams carbohydrate at all times of day.   correction bolus (which some people call "sensitivity," or "insulin sensitivity ratio," or just "isr") of 1 unit for each 55 by which glucose exceeds 100.  TDD is 39 units (including 26 units of basal).  I reviewed continuous glucose monitor data.  Glucose varies from 80-330.  It is in general higher as the day goes on, but not necessarily so.   Past Medical History:  Diagnosis Date  . ADHD   . Anxiety   . Diabetes mellitus without complication ([place])   . STD (sexually transmitted disease)    chlaymdia- 10 years ago    Past Surgical History:  Procedure Laterality Date  . CESAREAN SECTION      Social History   Socioeconomic History  . Marital status: Married    Spouse name: Not on file  . Number of children: Not on file  . Years of education: Not on file  . Highest education level: Not on file  Occupational History  . Not on file  Social Needs  . Financial resource strain: Not on file  . Food insecurity:    Worry: Not on file    Inability: Not on file  . Transportation needs:    Medical: Not on file    Non-medical: Not on file  Tobacco Use  . Smoking status: Never Smoker  . Smokeless tobacco: Never Used  Substance and Sexual Activity  . Alcohol use: Yes    Comment: rare  . Drug use: No  .  Sexual activity: Yes    Birth control/protection: I.U.D.  Lifestyle  . Physical activity:    Days per week: 0 days    Minutes per session: 0 min  . Stress: Not on file  Relationships  . Social connections:    Talks on phone: Not on file    Gets together: Not on file    Attends religious service: Not on file    Active member of club or organization: Not on file    Attends meetings of clubs or organizations: Not on file    Relationship status: Not on file  . Intimate partner violence:    Fear of current or ex partner: No    Emotionally abused: No    Physically abused: No    Forced sexual activity: No  Other Topics Concern  . Not on file  Social History Narrative  . Not on file    Current Outpatient Medications on File Prior to Visit  Medication Sig Dispense Refill  . atomoxetine (STRATTERA) 60 MG capsule Take 60 mg by mouth daily.    . clonazePAM (KLONOPIN) 0.5 MG tablet Take 1 tablet by mouth 2 (two) times daily as needed.   0  . glucagon (GLUCAGON EMERGENCY) 1 MG injection 1 mg.    Marland Kitchen  hydrOXYzine (ATARAX/VISTARIL) 10 MG tablet Take by mouth.    . insulin aspart (NOVOLOG) 100 UNIT/ML injection USE VIA INSULIN PUMP. MAX OF 80 UNITS PER DAY. 30 mL 11  . Insulin Disposable Pump (OMNIPOD DASH SYSTEM) KIT by Does not apply route.    Marland Kitchen VYVANSE 20 MG capsule      No current facility-administered medications on file prior to visit.     Allergies  Allergen Reactions  . Penicillins Anaphylaxis  . Amoxicillin Other (See Comments)    Family History  Problem Relation Age of Onset  . Breast cancer Neg Hx   . Ovarian cancer Neg Hx   . Colon cancer Neg Hx   . Diabetes Neg Hx   . Heart disease Neg Hx   . Diabetes gravidarum Neg Hx   . Diabetes type I Neg Hx     BP 138/80 (BP Location: Left Arm, Patient Position: Sitting, Cuff Size: Normal)   Pulse (!) 115   Ht 5' 0.5" (1.537 m)   Wt 181 lb 6.4 oz (82.3 kg)   LMP 10/25/2018   SpO2 96%   BMI 34.84 kg/m    Review of  Systems She denies hypoglycemia.      Objective:   Physical Exam VITAL SIGNS:  See vs page GENERAL: no distress Pulses: dorsalis pedis intact bilat.   MSK: no deformity of the feet CV: no leg edema Skin:  no ulcer on the feet.  normal color and temp on the feet. Neuro: sensation is intact to touch on the feet  Lab Results  Component Value Date   TSH 2.640 09/12/2018     Lab Results  Component Value Date   HGBA1C 8.1 (A) 10/31/2018       Assessment & Plan:  Type 1 DM: she needs increased rx.  Tachycardia: worse.  uncertain etiology.   Patient Instructions  check your blood sugar 8 times a day.  vary the time of day when you check, between before the 3 meals, and at bedtime.  also check if you have symptoms of your blood sugar being too high or too low.  please keep a record of the readings and bring it to your next appointment here (or you can bring the meter itself).  You can write it on any piece of paper.  please call us sooner if your blood sugar goes below 70, or if you have a lot of readings over 200.  Please continue these settings, when you get your Omnipods:  basal rate of 1.1 units/hr, 6 AM-9PM, and 1.25/HR at other times.    mealtime bolus of 1 unit/4 grams carbohydrate at all times of day.   correction bolus (which some people call "sensitivity," or "insulin sensitivity ratio," or just "isr") of 1 unit for each 55 by which your glucose exceeds 100.  If you want, we can consider adding "Trulicity," or "Ozempic."  Please see a heart specialist.  you will receive a phone call, about a day and time for an appointment. In view of your medical condition, you should avoid pregnancy until we have decided it is safe Please come back for a follow-up appointment in 2 months

## 2018-10-31 NOTE — Patient Instructions (Addendum)
check your blood sugar 8 times a day.  vary the time of day when you check, between before the 3 meals, and at bedtime.  also check if you have symptoms of your blood sugar being too high or too low.  please keep a record of the readings and bring it to your next appointment here (or you can bring the meter itself).  You can write it on any piece of paper.  please call us sooner if your blood sugar goes below 70, or if you have a lot of readings over 200.  Please continue these settings, when you get your Omnipods:  basal rate of 1.1 units/hr, 6 AM-9PM, and 1.25/HR at other times.    mealtime bolus of 1 unit/4 grams carbohydrate at all times of day.   correction bolus (which some people call "sensitivity," or "insulin sensitivity ratio," or just "isr") of 1 unit for each 55 by which your glucose exceeds 100.  If you want, we can consider adding "Trulicity," or "Ozempic."  Please see a heart specialist.  you will receive a phone call, about a day and time for an appointment. In view of your medical condition, you should avoid pregnancy until we have decided it is safe Please come back for a follow-up appointment in 2 months

## 2018-10-31 NOTE — Progress Notes (Signed)
Patient comes in to discuss getting pregnant. She would like to talk about what the risks are with her job and the medications that she is on.

## 2018-11-01 ENCOUNTER — Encounter: Payer: Self-pay | Admitting: Endocrinology

## 2018-11-01 LAB — BUN+CREAT
BUN/Creatinine Ratio: 13 (ref 9–23)
BUN: 10 mg/dL (ref 6–20)
Creatinine, Ser: 0.79 mg/dL (ref 0.57–1.00)
GFR calc Af Amer: 117 mL/min/{1.73_m2} (ref >59)
GFR, EST NON AFRICAN AMERICAN: 101 mL/min/{1.73_m2} (ref >59)

## 2018-11-01 NOTE — Telephone Encounter (Signed)
Please see if we can expedite this appt to another physician.

## 2018-11-01 NOTE — Telephone Encounter (Signed)
Please review and advise.

## 2018-11-06 ENCOUNTER — Telehealth: Payer: Self-pay | Admitting: Endocrinology

## 2018-11-06 ENCOUNTER — Ambulatory Visit
Admission: RE | Admit: 2018-11-06 | Discharge: 2018-11-06 | Disposition: A | Discharge status: Home / Self Care | Payer: BLUE CROSS/BLUE SHIELD | Source: Ambulatory Visit | Attending: Podiatry | Admitting: Podiatry

## 2018-11-06 DIAGNOSIS — M67471 Ganglion, right ankle and foot: Secondary | ICD-10-CM | POA: Insufficient documentation

## 2018-11-06 DIAGNOSIS — M85671 Other cyst of bone, right ankle and foot: Secondary | ICD-10-CM | POA: Diagnosis not present

## 2018-11-06 MED ORDER — GADOBUTROL 1 MMOL/ML IV SOLN
7.5000 mL | Freq: Once | INTRAVENOUS | Status: AC | PRN
  Administered 2018-11-06: 7.5 mL via INTRAVENOUS

## 2018-11-06 NOTE — Telephone Encounter (Signed)
Patient has called stating that at her last appointment she discussed with Dr. Everardo All about him talking to [employer] about price's of her pump supplies. She has not heard anything.  Please Advise, thanks

## 2018-11-06 NOTE — Telephone Encounter (Signed)
LMTCB have the appt moved up to 2.28.20 to be seen by Dr. Dulce Sellar

## 2018-11-06 NOTE — Telephone Encounter (Signed)
Called pt to let her know that Bonita Quin will not be in the office this week. Advised a message has been sent to [facility]. Aware Bonita Quin will call once she returns to the office. Verbalized acceptance and understanding

## 2018-11-07 ENCOUNTER — Telehealth: Payer: Self-pay | Admitting: Endocrinology

## 2018-11-07 NOTE — Telephone Encounter (Signed)
She has a $3000.00 deduct able, so her pods will cost her $600.00/month, for pump and CGM supplies, and she can not afford this.  Discussed other options.  She was given Brett's number to call to see if she can bill these now under her DMI, instead of pharmacy.  She agreed to call him.  Did not have sample pods to give her.

## 2018-11-07 NOTE — Telephone Encounter (Signed)
Thank you :)

## 2018-11-07 NOTE — Telephone Encounter (Signed)
-----   Message from Jessica Priest, RN sent at 11/07/2018 10:10 AM EST ----- I called her.  She was given other options to try.  She was also given Brett's number--the Omnipod rep)--to see if it can be billed as DME or pharmacy--whichever is cheaper, without the $3000.00 deductible that she has not meet.   ----- Message ----- From: Romero Belling, MD Sent: 10/31/2018   4:38 PM EST To: Jessica Priest, RN  L Pt now has [employer].  She says Omnipods are now $633/month.  Anything we can do?  Thanks.  S

## 2018-11-08 ENCOUNTER — Telehealth: Payer: Self-pay | Admitting: *Deleted

## 2018-11-08 NOTE — Telephone Encounter (Signed)
Left message informing pt of Dr. Geryl Rankins review of MRI results, and request to send copy of MRI disc to radiology specialist for more details for treatment planning, there would be a 10-14 day delay for final results and once received we would call with instructions. Faxed request for MRI copy disc to [facility].

## 2018-11-08 NOTE — Telephone Encounter (Signed)
-----   Message from Elinor Parkinson, [place] sent at 11/07/2018  5:08 PM EST ----- Send for over read please

## 2018-11-12 DIAGNOSIS — F411 Generalized anxiety disorder: Secondary | ICD-10-CM | POA: Diagnosis not present

## 2018-11-12 DIAGNOSIS — F900 Attention-deficit hyperactivity disorder, predominantly inattentive type: Secondary | ICD-10-CM | POA: Diagnosis not present

## 2018-11-12 NOTE — Telephone Encounter (Signed)
Received copy of MRI disc and mailed to [facility]. 

## 2018-11-20 DIAGNOSIS — E109 Type 1 diabetes mellitus without complications: Secondary | ICD-10-CM | POA: Diagnosis not present

## 2018-11-29 DIAGNOSIS — M79641 Pain in right hand: Secondary | ICD-10-CM | POA: Diagnosis not present

## 2018-11-29 DIAGNOSIS — E1065 Type 1 diabetes mellitus with hyperglycemia: Secondary | ICD-10-CM | POA: Diagnosis not present

## 2018-11-29 DIAGNOSIS — E669 Obesity, unspecified: Secondary | ICD-10-CM | POA: Diagnosis not present

## 2018-11-29 DIAGNOSIS — S60051A Contusion of right little finger without damage to nail, initial encounter: Secondary | ICD-10-CM | POA: Diagnosis not present

## 2018-11-29 NOTE — Progress Notes (Signed)
Cardiology Office Note:    Date:  11/30/2018   ID:  Jordan Leonard, DOB November 14, 1990, MRN [id number]  PCP:  Sallee Lange, NP  Cardiologist:  Shirlee More, MD   Referring MD: Renato Shin, MD  ASSESSMENT:    1. Tachycardia   2. Type 1 diabetes mellitus without complication (HCC)   3. Attention deficit hyperactivity disorder (ADHD), unspecified ADHD type    PLAN:    In order of problems listed above:  1. She has mild resting tachycardia typically due to medications are underlying and noncardiac conditions.  She has not had a CBC in 3 years with a very productive woman at risk for iron deficiency anemia and is cold intolerance we will check a CBC.  I suspect her medications for attention deficit disorder is the cause and I do not think she needs to stop.  With longstanding diabetes is at risk for heart disease and after discussion of benefits and options will undergo echocardiogram to assure so she does not have cardiomyopathy. 2. Stable managed by endocrinology 3. Stable at this time I would not withdraw her stimulant medication  Next appointment 4 weeks to review testing   Medication Adjustments/Labs and Tests Ordered: Current medicines are reviewed at length with the patient today.  Concerns regarding medicines are outlined above.  Orders Placed This Encounter  Procedures  . CBC  . EKG 12-Lead  . ECHOCARDIOGRAM COMPLETE   No orders of the defined types were placed in this encounter.    Chief Complaint  Patient presents with  . New Patient (Initial Visit)  . Tachycardia    History of Present Illness:    Jordan Leonard is a 28 y.o. female who is being seen today for the evaluation of tachycardia HR 115 BPM at office 10/31/18 at the request of Renato Shin, MD.  Her sinus tachycardia is at rest persistent over time and mild to moderate.  After being told of the need for cardiology referral she is a little apprehensive she is aware that her heart is  rapid at times and if she is hypoglycemic she has palpitation she has no history of arrhythmia and she does take Atarax which can be provocative.  Her thyroid is normal she has no neuropathy but she is reproductive and is unaware if she has anemia her CBC 3 years ago was normal is a common precipitant in young women and CBC will be checked today.  No history of congenital rheumatic heart disease no chest pain shortness of breath syncope.  Physical examination today is normal.  Her diabetes is type I longstanding 20 years without known complications including neuropathy.  Vitals from encounters over the past 365 days   Encounter date 10/31/18 10/31/18 09/18/18  Last reading 4:00 PM 11:40 AM 3:42 PM  Vitals     BP 138/80 117/87 118/82  Pulse Rate 115 90 119  SpO2 96 % -- 92 %  Height 5' 0.5" (1.537 m) 5' 0.5" (1.537 m) 5' (1.524 m)  Weight 181 lb 6.4 oz (82.3 kg) 180 lb 3.2 oz (81.7 kg) 178 lb 3.2 oz (80.8 kg    EKG 12/06/15 personally reviewed with sinus tachycardia 110 BPM. Past Medical History:  Diagnosis Date  . ADHD   . Anxiety   . Diabetes mellitus without complication ([place])   . STD (sexually transmitted disease)    chlaymdia- 10 years ago    Past Surgical History:  Procedure Laterality Date  . CESAREAN SECTION  Current Medications: Current Meds  Medication Sig  . atomoxetine (STRATTERA) 60 MG capsule Take 60 mg by mouth daily.  . clonazePAM (KLONOPIN) 0.5 MG tablet Take 1 tablet by mouth 2 (two) times daily as needed.   Marland Kitchen glucagon (GLUCAGON EMERGENCY) 1 MG injection 1 mg as needed.   . hydrOXYzine (ATARAX/VISTARIL) 10 MG tablet Take 10 mg by mouth daily as needed.   . insulin aspart (NOVOLOG) 100 UNIT/ML injection USE VIA INSULIN PUMP. MAX OF 80 UNITS PER DAY.  Marland Kitchen Insulin Disposable Pump (OMNIPOD DASH SYSTEM) KIT by Does not apply route.  Marland Kitchen VYVANSE 20 MG capsule Take 20 mg by mouth daily.      Allergies:   Penicillins and Amoxicillin   Social History   Socioeconomic  History  . Marital status: Married    Spouse name: Not on file  . Number of children: Not on file  . Years of education: Not on file  . Highest education level: Not on file  Occupational History  . Not on file  Social Needs  . Financial resource strain: Not on file  . Food insecurity:    Worry: Not on file    Inability: Not on file  . Transportation needs:    Medical: Not on file    Non-medical: Not on file  Tobacco Use  . Smoking status: Never Smoker  . Smokeless tobacco: Never Used  Substance and Sexual Activity  . Alcohol use: Yes    Comment: rare  . Drug use: No  . Sexual activity: Yes    Birth control/protection: I.U.D.  Lifestyle  . Physical activity:    Days per week: 0 days    Minutes per session: 0 min  . Stress: Not on file  Relationships  . Social connections:    Talks on phone: Not on file    Gets together: Not on file    Attends religious service: Not on file    Active member of club or organization: Not on file    Attends meetings of clubs or organizations: Not on file    Relationship status: Not on file  Other Topics Concern  . Not on file  Social History Narrative  . Not on file     Family History: The patient's family history includes Asthma in her sister. There is no history of Breast cancer, Ovarian cancer, Colon cancer, Diabetes, Heart disease, Diabetes gravidarum, or Diabetes type I.  ROS:   Review of Systems  Constitution: Negative.  HENT: Negative.   Eyes: Negative.   Cardiovascular: Positive for palpitations (since i was told my heart is rapid).  Respiratory: Negative.   Endocrine: Positive for cold intolerance.  Hematologic/Lymphatic: Negative.   Skin: Negative.   Musculoskeletal: Negative.   Gastrointestinal: Negative.   Genitourinary: Negative.   Neurological: Positive for difficulty with concentration (had ADDH).  Psychiatric/Behavioral: Negative.   Allergic/Immunologic: Negative.    Please see the history of present illness.      All other systems reviewed and are negative.  EKGs/Labs/Other Studies Reviewed:    The following studies were reviewed today:   EKG:  EKG is  ordered today and personally reviewed.  The ekg ordered today demonstrates sinus tachycardia otherwise normal low voltage  Recent Labs: 09/12/2018: TSH 2.640 10/31/2018: BUN 10; Creatinine, Ser 0.79  Recent Lipid Panel    Component Value Date/Time   CHOL 150 09/12/2018 0828   TRIG 49 09/12/2018 0828   HDL 57 09/12/2018 0828   CHOLHDL 2.6 09/12/2018 0828   LDLCALC  83 09/12/2018 0828    Physical Exam:    VS:  BP 118/76   Pulse 98   Ht 5' 0.6" (1.539 m)   Wt 182 lb (82.6 kg)   SpO2 97%   BMI 34.84 kg/m     Wt Readings from Last 3 Encounters:  11/30/18 182 lb (82.6 kg)  10/31/18 181 lb 6.4 oz (82.3 kg)  10/31/18 180 lb 3.2 oz (81.7 kg)     GEN:  Well nourished, well developed in no acute distress HEENT: Normal NECK: No JVD; No carotid bruits LYMPHATICS: No lymphadenopathy CARDIAC: RRR, no murmurs, rubs, gallops RESPIRATORY:  Clear to auscultation without rales, wheezing or rhonchi  ABDOMEN: Soft, non-tender, non-distended MUSCULOSKELETAL:  No edema; No deformity  SKIN: Warm and dry NEUROLOGIC:  Alert and oriented x 3 PSYCHIATRIC:  Normal affect     Signed, Shirlee More, MD  11/30/2018 9:54 AM    [place]

## 2018-11-30 ENCOUNTER — Ambulatory Visit (INDEPENDENT_AMBULATORY_CARE_PROVIDER_SITE_OTHER): Payer: BLUE CROSS/BLUE SHIELD | Admitting: Cardiology

## 2018-11-30 ENCOUNTER — Telehealth: Payer: Self-pay | Admitting: Podiatry

## 2018-11-30 ENCOUNTER — Telehealth: Payer: Self-pay | Admitting: Endocrinology

## 2018-11-30 ENCOUNTER — Encounter: Payer: Self-pay | Admitting: Cardiology

## 2018-11-30 VITALS — BP 118/76 | HR 98 | Ht 60.6 in | Wt 182.0 lb

## 2018-11-30 DIAGNOSIS — F909 Attention-deficit hyperactivity disorder, unspecified type: Secondary | ICD-10-CM | POA: Diagnosis not present

## 2018-11-30 DIAGNOSIS — R000 Tachycardia, unspecified: Secondary | ICD-10-CM | POA: Diagnosis not present

## 2018-11-30 DIAGNOSIS — E109 Type 1 diabetes mellitus without complications: Secondary | ICD-10-CM

## 2018-11-30 DIAGNOSIS — E119 Type 2 diabetes mellitus without complications: Secondary | ICD-10-CM

## 2018-11-30 NOTE — Telephone Encounter (Signed)
Ok, I changed back to type 1

## 2018-11-30 NOTE — Telephone Encounter (Signed)
Patient stated she just left her cardiologist appointment that Dr.Ellison referred her to and stated they said her diagnoses code is for Type 2 DM and she is a Type 1. Please clarify with office so billing goes correctly.  Please Advise, Thanks

## 2018-11-30 NOTE — Telephone Encounter (Signed)
Please confirm correct diagnosis

## 2018-11-30 NOTE — Patient Instructions (Addendum)
Medication Instructions:  Your physician recommends that you continue on your current medications as directed. Please refer to the Current Medication list given to you today.  If you need a refill on your cardiac medications before your next appointment, please call your pharmacy.   Lab work: Your physician recommends that you will have a CBC drawn today.   If you have labs (blood work) drawn today and your tests are completely normal, you will receive your results only by: Marland Kitchen MyChart Message (if you have MyChart) OR . A paper copy in the mail If you have any lab test that is abnormal or we need to change your treatment, we will call you to review the results.  Testing/Procedures: An EKG was performed today.  Your physician has requested that you have an echocardiogram. Echocardiography is a painless test that uses sound waves to create images of your heart. It provides your doctor with information about the size and shape of your heart and how well your heart's chambers and valves are working. This procedure takes approximately one hour. There are no restrictions for this procedure.    Follow-Up: At [facility], you and your health needs are our priority.  As part of our continuing mission to provide you with exceptional heart care, we have created designated Provider Care Teams.  These Care Teams include your primary Cardiologist (physician) and Advanced Practice Providers (APPs -  Physician Assistants and Nurse Practitioners) who all work together to provide you with the care you need, when you need it. You will need a follow up appointment in 1 months.     Any Other Special Instructions Will Be Listed Below    1. Avoid all over-the-counter antihistamines except Claritin/Loratadine and Zyrtec/Cetrizine. 2. Avoid all combination including cold sinus allergies flu decongestant and sleep medications 3. You can use Robitussin DM Mucinex and Mucinex DM for cough. 4. can use Tylenol  aspirin ibuprofen and naproxen but no combinations such as sleep or sinus.    Echocardiogram An echocardiogram is a procedure that uses painless sound waves (ultrasound) to produce an image of the heart. Images from an echocardiogram can provide important information about:  Signs of coronary artery disease (CAD).  Aneurysm detection. An aneurysm is a weak or damaged part of an artery wall that bulges out from the normal force of blood pumping through the body.  Heart size and shape. Changes in the size or shape of the heart can be associated with certain conditions, including heart failure, aneurysm, and CAD.  Heart muscle function.  Heart valve function.  Signs of a past heart attack.  Fluid buildup around the heart.  Thickening of the heart muscle.  A tumor or infectious growth around the heart valves. Tell a health care provider about:  Any allergies you have.  All medicines you are taking, including vitamins, herbs, eye drops, creams, and over-the-counter medicines.  Any blood disorders you have.  Any surgeries you have had.  Any medical conditions you have.  Whether you are pregnant or may be pregnant. What are the risks? Generally, this is a safe procedure. However, problems may occur, including:  Allergic reaction to dye (contrast) that may be used during the procedure. What happens before the procedure? No specific preparation is needed. You may eat and drink normally. What happens during the procedure?   An IV tube may be inserted into one of your veins.  You may receive contrast through this tube. A contrast is an injection that improves the quality  of the pictures from your heart.  A gel will be applied to your chest.  A wand-like tool (transducer) will be moved over your chest. The gel will help to transmit the sound waves from the transducer.  The sound waves will harmlessly bounce off of your heart to allow the heart images to be captured in  real-time motion. The images will be recorded on a computer. The procedure may vary among health care providers and hospitals. What happens after the procedure?  You may return to your normal, everyday life, including diet, activities, and medicines, unless your health care provider tells you not to do that. Summary  An echocardiogram is a procedure that uses painless sound waves (ultrasound) to produce an image of the heart.  Images from an echocardiogram can provide important information about the size and shape of your heart, heart muscle function, heart valve function, and fluid buildup around your heart.  You do not need to do anything to prepare before this procedure. You may eat and drink normally.  After the echocardiogram is completed, you may return to your normal, everyday life, unless your health care provider tells you not to do that. This information is not intended to replace advice given to you by your health care provider. Make sure you discuss any questions you have with your health care provider. Document Released: 09/16/2000 Document Revised: 10/22/2016 Document Reviewed: 10/22/2016 Elsevier Interactive Patient Education  2019 [employer].

## 2018-11-30 NOTE — Telephone Encounter (Signed)
I had an MRI done and got a call that it was being sent out for an overread. I have not heard anything and I wanted to follow up.

## 2018-12-01 LAB — CBC
Hematocrit: 38.9 % (ref 34.0–46.6)
Hemoglobin: 13.6 g/dL (ref 11.1–15.9)
MCH: 29.8 pg (ref 26.6–33.0)
MCHC: 35 g/dL (ref 31.5–35.7)
MCV: 85 fL (ref 79–97)
Platelets: 342 10*3/uL (ref 150–450)
RBC: 4.56 x10E6/uL (ref 3.77–5.28)
RDW: 12.1 % (ref 11.7–15.4)
WBC: 6.8 10*3/uL (ref 3.4–10.8)

## 2018-12-03 ENCOUNTER — Encounter: Payer: Self-pay | Admitting: Podiatry

## 2018-12-11 ENCOUNTER — Ambulatory Visit (HOSPITAL_COMMUNITY): Payer: BLUE CROSS/BLUE SHIELD | Attending: Cardiovascular Disease

## 2018-12-11 DIAGNOSIS — F900 Attention-deficit hyperactivity disorder, predominantly inattentive type: Secondary | ICD-10-CM | POA: Diagnosis not present

## 2018-12-11 DIAGNOSIS — E119 Type 2 diabetes mellitus without complications: Secondary | ICD-10-CM | POA: Diagnosis not present

## 2018-12-11 DIAGNOSIS — R000 Tachycardia, unspecified: Secondary | ICD-10-CM | POA: Diagnosis not present

## 2018-12-11 DIAGNOSIS — F411 Generalized anxiety disorder: Secondary | ICD-10-CM | POA: Diagnosis not present

## 2018-12-12 ENCOUNTER — Other Ambulatory Visit: Payer: Self-pay

## 2018-12-12 ENCOUNTER — Encounter: Payer: Self-pay | Admitting: Podiatry

## 2018-12-12 ENCOUNTER — Ambulatory Visit (INDEPENDENT_AMBULATORY_CARE_PROVIDER_SITE_OTHER): Payer: BLUE CROSS/BLUE SHIELD | Admitting: Podiatry

## 2018-12-12 DIAGNOSIS — M7989 Other specified soft tissue disorders: Secondary | ICD-10-CM | POA: Diagnosis not present

## 2018-12-12 NOTE — Patient Instructions (Signed)
Pre-Operative Instructions  Congratulations, you have decided to take an important step towards improving your quality of life.  You can be assured that the doctors and staff at [facility] will be with you every step of the way.  Here are some important things you should know:  1. Plan to be at the surgery center/hospital at least 1 (one) hour prior to your scheduled time, unless otherwise directed by the surgical center/hospital staff.  You must have a responsible adult accompany you, remain during the surgery and drive you home.  Make sure you have directions to the surgical center/hospital to ensure you arrive on time. 2. If you are having surgery at [facility] or [facility] hospitals, you will need a copy of your medical history and physical form from your family physician within one month prior to the date of surgery. We will give you a form for your primary physician to complete.  3. We make every effort to accommodate the date you request for surgery.  However, there are times where surgery dates or times have to be moved.  We will contact you as soon as possible if a change in schedule is required.   4. No aspirin/ibuprofen for one week before surgery.  If you are on aspirin, any non-steroidal anti-inflammatory medications (Mobic, Aleve, Ibuprofen) should not be taken seven (7) days prior to your surgery.  You make take Tylenol for pain prior to surgery.  5. Medications - If you are taking daily heart and blood pressure medications, seizure, reflux, allergy, asthma, anxiety, pain or diabetes medications, make sure you notify the surgery center/hospital before the day of surgery so they can tell you which medications you should take or avoid the day of surgery. 6. No food or drink after midnight the night before surgery unless directed otherwise by surgical center/hospital staff. 7. No alcoholic beverages 24-hours prior to surgery.  No smoking 24-hours prior or 24-hours after  surgery. 8. Wear loose pants or shorts. They should be loose enough to fit over bandages, boots, and casts. 9. Don't wear slip-on shoes. Sneakers are preferred. 10. Bring your boot with you to the surgery center/hospital.  Also bring crutches or a walker if your physician has prescribed it for you.  If you do not have this equipment, it will be provided for you after surgery. 11. If you have not been contacted by the surgery center/hospital by the day before your surgery, call to confirm the date and time of your surgery. 12. Leave-time from work may vary depending on the type of surgery you have.  Appropriate arrangements should be made prior to surgery with your employer. 13. Prescriptions will be provided immediately following surgery by your doctor.  Fill these as soon as possible after surgery and take the medication as directed. Pain medications will not be refilled on weekends and must be approved by the doctor. 14. Remove nail polish on the operative foot and avoid getting pedicures prior to surgery. 15. Wash the night before surgery.  The night before surgery wash the foot and leg well with water and the antibacterial soap provided. Be sure to pay special attention to beneath the toenails and in between the toes.  Wash for at least three (3) minutes. Rinse thoroughly with water and dry well with a towel.  Perform this wash unless told not to do so by your physician.  Enclosed: 1 Ice pack (please put in freezer the night before surgery)   1 Hibiclens skin cleaner     Pre-op instructions  If you have any questions regarding the instructions, please do not hesitate to call our office.  [place]: [address], [place], [place] [address] -- [phone]  [place]: [address] [place], [place] [address] -- [phone]  [place]: [address]  , [place] [address] -- [phone]  [place]: [address], [address], [place], [place] [address] -- [phone]  Website:  [email or web address] 

## 2018-12-12 NOTE — Progress Notes (Signed)
She presents today for follow-up and surgical consult plantar aspect second toe right foot.  She states that is still painful and we need to get this fixed.  Objective: Vital signs are stable alert and oriented x3.  Pulses are palpable.  Capillary fill time is immediate to the toe.  MRI report does demonstrate that appears to be a soft tissue tumor distal aspect of the toe of unknown origin.  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  Soft tissue tumor.  Plan: Discussed etiology pathology and surgical therapies.  I explained to her how the surgery was complete performed and performed at surgery Center with a block from the thigh and should be wearing it cam walker afterwards.  This was dispensed today.  We did discuss the possible postop complications which may include but not limited to postop pain bleeding swelling infection recurrence need further surgery overcorrection under correction loss of digit loss of limb loss of life.  She understands this and is amenable to it.  At this point we consented her for excision soft tissue mass second toe.  We provided her with both oral and written home-going instructions as well as information regarding the surgery center and anesthesia group.

## 2018-12-14 ENCOUNTER — Telehealth: Payer: Self-pay | Admitting: *Deleted

## 2018-12-14 NOTE — Telephone Encounter (Signed)
"  I need to schedule my surgery with Dr. Al Corpus.  I left a message yesterday for someone to call me back."  Dr. Al Corpus does surgeries on Fridays.  Do you have a date you would like?  "I have a few dates that I would like based upon my husband's work schedule."  What are those dates?  "The first one is March 27."  That date, March 2, is available.  "Okay, go ahead and put me down for then."  I'll get it scheduled.  Someone from the surgical center will call you a day or two prior to your surgery date and they will give you your arrival time.  You need to go online an register with the surgical center via their [facility] [employer].  The instructions on how to register are int he brochure that we gave you.

## 2018-12-19 DIAGNOSIS — E109 Type 1 diabetes mellitus without complications: Secondary | ICD-10-CM | POA: Diagnosis not present

## 2018-12-26 ENCOUNTER — Telehealth: Payer: Self-pay | Admitting: *Deleted

## 2018-12-26 NOTE — Telephone Encounter (Signed)
I am calling you regarding your surgery.  I need to reschedule it.  "Okay, that's fine."  Dr. Geryl Rankins next available date is April 24.  Is that date okay.  "Yes, that date will be fine."  Please take care.

## 2019-01-01 ENCOUNTER — Ambulatory Visit: Payer: BLUE CROSS/BLUE SHIELD | Admitting: Endocrinology

## 2019-01-01 ENCOUNTER — Other Ambulatory Visit: Payer: Self-pay

## 2019-01-01 ENCOUNTER — Ambulatory Visit (INDEPENDENT_AMBULATORY_CARE_PROVIDER_SITE_OTHER): Payer: BLUE CROSS/BLUE SHIELD | Admitting: Endocrinology

## 2019-01-01 ENCOUNTER — Encounter: Payer: Self-pay | Admitting: Endocrinology

## 2019-01-01 ENCOUNTER — Telehealth: Payer: Self-pay | Admitting: *Deleted

## 2019-01-01 DIAGNOSIS — E109 Type 1 diabetes mellitus without complications: Secondary | ICD-10-CM

## 2019-01-01 NOTE — Patient Instructions (Addendum)
check your blood sugar 8 times a day.  vary the time of day when you check, between before the 3 meals, and at bedtime.  also check if you have symptoms of your blood sugar being too high or too low.  please keep a record of the readings and bring it to your next appointment here (or you can bring the meter itself).  You can write it on any piece of paper.  please call us sooner if your blood sugar goes below 70, or if you have a lot of readings over 200.  Please continue these settings, when you get your Omnipods:  basal rate of 1.1 units/hr, 6 AM-9PM, and 1.25/HR at other times.    mealtime bolus of 1 unit/4 grams carbohydrate at all times of day.   correction bolus (which some people call "sensitivity," or "insulin sensitivity ratio," or just "isr") of 1 unit for each 55 by which your glucose exceeds 100.  If you want, we can consider adding "Trulicity," or "Ozempic."   In view of your medical condition, you should avoid pregnancy until we have decided it is safe Please come back for a follow-up appointment in 2-3 months.    Please come in for A1c.  Based on the results, we'll probably reduce the basal and increase the bolus.

## 2019-01-01 NOTE — Telephone Encounter (Signed)
"  My husband was able to get that Thursday off, April 9 correct?"  Yes, that is April 9 is correct.  "Go ahead and schedule me for then.  Someone from the surgery center will call me a day or two before and give me my arrival time, correct?"  Yes, someone from the surgical center will call you and give your your arrival time.  I will reschedule your surgery.  I rescheduled her surgery from January 25, 2019 to January 10, 2019 via the surgical center's [facility].

## 2019-01-01 NOTE — Telephone Encounter (Signed)
"  I am returning your call."  Dr. Al Corpus wanted me to see if you would like to have your surgery next week.  "Instead of waiting until April 24, sure that will be fine.  Will it be on Friday, April 10?"  They don't have any time available on that Friday.  They can do it on that Thursday, January 10, 2019.  "Can I check with my fiance and see if that date will be okay with him and give you a call back.  He has to take off work because he'll be the one bringing me.  I'll call you back."

## 2019-01-01 NOTE — Telephone Encounter (Signed)
Per Dr. Al Corpus, I attempted to call the patient in regards to getting her surgery rescheduled from 01/25/2019 to 01/10/08.  I left her a message asking her to call me back.

## 2019-01-01 NOTE — Progress Notes (Addendum)
Subjective:    Patient ID: Jordan Leonard, female    DOB: 05-04-1991, 28 y.o.   MRN: [id number]  HPI  telehealth visit today via doxy video visit.  Alternatives to telehealth are presented to this patient, and the patient agrees to the telehealth visit. Pt is advised of the cost of the visit, and agrees to this, also.   Patient is at home, and I am at the office.   Pt returns for f/u of diabetes mellitus:  DM type: 1 Dx'ed: 9242 Complications: none Therapy: insulin since dx, and pump rx since 2005 (Omnipod since 2019).   GDM: never.  DKA: only once, at dx Severe hypoglycemia: never Pancreatitis: never Pancreatic imaging: never.  Other: she uses Freestyle Libre continuous glucose monitor, and Onmipod pump; she is not at risk for pregnancy now; she eats just 1 meal per day.   Interval history:  She takes these pump settings:  basal rate of 1.1 units/hr, 6 AM-9PM, and 1.25/HR at other times.    mealtime bolus of 1 unit/4 grams carbohydrate at all times of day.   correction bolus (which some people call "sensitivity," or "insulin sensitivity ratio," or just "isr") of 1 unit for each 55 by which your glucose exceeds 100.  TDD is again 39 units (mostly basal).   I reviewed continuous glucose monitor data.  Glucose varies from 46-230.  There is no trend throughout the day, except it is sometime low in the middle of the night.  She is concerned that the hypoglycemia in the middle of the night does not awaken her.  She can take just 1 unit/5 grams---if she takes more, she gets hypoglycemia.  She will have toe surg soon.   Past Medical History:  Diagnosis Date  . ADHD   . Anxiety   . Diabetes mellitus type 1 ([place])   . STD (sexually transmitted disease)    chlaymdia- 10 years ago    Past Surgical History:  Procedure Laterality Date  . CESAREAN SECTION      Social History   Socioeconomic History  . Marital status: Married    Spouse name: Not on file  . Number of children: Not  on file  . Years of education: Not on file  . Highest education level: Not on file  Occupational History  . Not on file  Social Needs  . Financial resource strain: Not on file  . Food insecurity:    Worry: Not on file    Inability: Not on file  . Transportation needs:    Medical: Not on file    Non-medical: Not on file  Tobacco Use  . Smoking status: Never Smoker  . Smokeless tobacco: Never Used  Substance and Sexual Activity  . Alcohol use: Yes    Comment: rare  . Drug use: No  . Sexual activity: Yes    Birth control/protection: I.U.D.  Lifestyle  . Physical activity:    Days per week: 0 days    Minutes per session: 0 min  . Stress: Not on file  Relationships  . Social connections:    Talks on phone: Not on file    Gets together: Not on file    Attends religious service: Not on file    Active member of club or organization: Not on file    Attends meetings of clubs or organizations: Not on file    Relationship status: Not on file  . Intimate partner violence:    Fear of current or ex partner:  No    Emotionally abused: No    Physically abused: No    Forced sexual activity: No  Other Topics Concern  . Not on file  Social History Narrative  . Not on file    Current Outpatient Medications on File Prior to Visit  Medication Sig Dispense Refill  . atomoxetine (STRATTERA) 60 MG capsule Take 60 mg by mouth daily.    . clonazePAM (KLONOPIN) 0.5 MG tablet Take 1 tablet by mouth 2 (two) times daily as needed.   0  . glucagon (GLUCAGON EMERGENCY) 1 MG injection 1 mg as needed.     . hydrOXYzine (ATARAX/VISTARIL) 10 MG tablet Take 10 mg by mouth daily as needed.     . insulin aspart (NOVOLOG) 100 UNIT/ML injection USE VIA INSULIN PUMP. MAX OF 80 UNITS PER DAY. 30 mL 11  . Insulin Disposable Pump (OMNIPOD DASH SYSTEM) KIT by Does not apply route.    Marland Kitchen VYVANSE 20 MG capsule Take 20 mg by mouth daily.      No current facility-administered medications on file prior to visit.      Allergies  Allergen Reactions  . Penicillins Anaphylaxis  . Amoxicillin Other (See Comments)    Family History  Problem Relation Age of Onset  . Asthma Sister   . Breast cancer Neg Hx   . Ovarian cancer Neg Hx   . Colon cancer Neg Hx   . Diabetes Neg Hx   . Heart disease Neg Hx   . Diabetes gravidarum Neg Hx   . Diabetes type I Neg Hx     There were no vitals taken for this visit.   Review of Systems Denies LOC    Objective:   Physical Exam   Lab Results  Component Value Date   HGBA1C 7.3 (A) 01/02/2019      Assessment & Plan:  Type 1 DM: Based on the pattern of her cbg's, she needs some adjustment in her therapy. Change basal and bolus, as per mychart results page.

## 2019-01-02 ENCOUNTER — Other Ambulatory Visit: Payer: BLUE CROSS/BLUE SHIELD

## 2019-01-02 LAB — POCT GLYCOSYLATED HEMOGLOBIN (HGB A1C): Hemoglobin A1C: 7.3 % — AB (ref 4.0–5.6)

## 2019-01-03 ENCOUNTER — Telehealth: Payer: Self-pay | Admitting: Endocrinology

## 2019-01-03 NOTE — Telephone Encounter (Signed)
Patient stated that she is still waiting on her A1C results.

## 2019-01-03 NOTE — Telephone Encounter (Signed)
My Chart message sent

## 2019-01-04 ENCOUNTER — Telehealth: Payer: Self-pay | Admitting: Cardiology

## 2019-01-04 NOTE — Telephone Encounter (Signed)
Cardiac Questionnaire:    Since your last visit or hospitalization:    1. Have you been having new or worsening chest pain? no   2. Have you been having new or worsening shortness of breath?no 3. Have you been having new or worsening leg swelling, wt gain, or increase in abdominal girth (pants fitting more tightly)? no   4. Have you had any passing out spells? no    *A YES to any of these questions would result in the appointment being kept. *If all the answers to these questions are NO, we should indicate that given the current situation regarding the worldwide coronarvirus pandemic, at the recommendation of the CDC, we are looking to limit gatherings in our waiting area, and thus will reschedule their appointment beyond four weeks from today.   _____________   COVID-19 Pre-Screening Questions:   Do you currently have a fever? no  Have you recently travelled on a cruise, internationally, or to [place], [place], [place], [place], [place], or [place], [place]) ?no  Have you been in contact with someone that is currently pending confirmation of Covid19 testing or has been confirmed to have the Covid19 virus?  no  Are you currently experiencing fatigue or cough? No  YOUR CARDIOLOGY TEAM HAS ARRANGED FOR AN E-VISIT FOR YOUR APPOINTMENT - PLEASE REVIEW IMPORTANT INFORMATION BELOW SEVERAL DAYS PRIOR TO YOUR APPOINTMENT  Due to the recent COVID-19 pandemic, we are transitioning in-person office visits to tele-medicine visits in an effort to decrease unnecessary exposure to our patients and staff. Medicare and most insurances are covering these visits without a copay needed. We also encourage you to sign up for MyChart if you have not already done so. You will need a smartphone if possible. For patients that do not have this, we can still complete the visit using a regular telephone but do prefer a smartphone to enable video when possible. You may have a close family member that lives with you that can  help. If possible, we also ask that you have a blood pressure cuff and scale at home to measure your blood pressure, heart rate and weight prior to your scheduled appointment. Patients with clinical needs that need an in-person evaluation and testing will still be able to come to the office if absolutely necessary. If you have any questions, feel free to call our office.  CONSENT FOR TELE-HEALTH VISIT - PLEASE REVIEW  I hereby voluntarily request, consent and authorize [facility] and its employed or contracted physicians, physician assistants, nurse practitioners or other licensed health care professionals (the Practitioner), to provide me with telemedicine health care services (the Services") as deemed necessary by the treating Practitioner. I acknowledge and consent to receive the Services by the Practitioner via telemedicine. I understand that the telemedicine visit will involve communicating with the Practitioner through live audiovisual communication technology and the disclosure of certain medical information by electronic transmission. I acknowledge that I have been given the opportunity to request an in-person assessment or other available alternative prior to the telemedicine visit and am voluntarily participating in the telemedicine visit.  I understand that I have the right to withhold or withdraw my consent to the use of telemedicine in the course of my care at any time, without affecting my right to future care or treatment, and that the Practitioner or I may terminate the telemedicine visit at any time. I understand that I have the right to inspect all information obtained and/or recorded in the course of the telemedicine  visit and may receive copies of available information for a reasonable fee.  I understand that some of the potential risks of receiving the Services via telemedicine include:   Delay or interruption in medical evaluation due to technological equipment failure or  disruption;  Information transmitted may not be sufficient (e.g. poor resolution of images) to allow for appropriate medical decision making by the Practitioner; and/or   In rare instances, security protocols could fail, causing a breach of personal health information.  Furthermore, I acknowledge that it is my responsibility to provide information about my medical history, conditions and care that is complete and accurate to the best of my ability. I acknowledge that Practitioner's advice, recommendations, and/or decision may be based on factors not within their control, such as incomplete or inaccurate data provided by me or distortions of diagnostic images or specimens that may result from electronic transmissions. I understand that the practice of medicine is not an exact science and that Practitioner makes no warranties or guarantees regarding treatment outcomes. I acknowledge that I will receive a copy of this consent concurrently upon execution via email to the email address I last provided but may also request a printed copy by calling the office of [facility].    I understand that my insurance will be billed for this visit.   I have read or had this consent read to me.  I understand the contents of this consent, which adequately explains the benefits and risks of the Services being provided via telemedicine.   I have been provided ample opportunity to ask questions regarding this consent and the Services and have had my questions answered to my satisfaction.  I give my informed consent for the services to be provided through the use of telemedicine in my medical care  By participating in this telemedicine visit I agree to the above. Patient agrees to give consent for a Webex call. 01/04/2019 pp

## 2019-01-08 ENCOUNTER — Telehealth (INDEPENDENT_AMBULATORY_CARE_PROVIDER_SITE_OTHER): Payer: BLUE CROSS/BLUE SHIELD | Admitting: Cardiology

## 2019-01-08 ENCOUNTER — Encounter: Payer: Self-pay | Admitting: Cardiology

## 2019-01-08 DIAGNOSIS — R000 Tachycardia, unspecified: Secondary | ICD-10-CM

## 2019-01-08 DIAGNOSIS — F909 Attention-deficit hyperactivity disorder, unspecified type: Secondary | ICD-10-CM

## 2019-01-08 NOTE — Patient Instructions (Signed)
Medication Instructions:  Your physician recommends that you continue on your current medications as directed. Please refer to the Current Medication list given to you today.  If you need a refill on your cardiac medications before your next appointment, please call your pharmacy.   Lab work: NONE If you have labs (blood work) drawn today and your tests are completely normal, you will receive your results only by: . MyChart Message (if you have MyChart) OR . A paper copy in the mail If you have any lab test that is abnormal or we need to change your treatment, we will call you to review the results.  Testing/Procedures: NONE  Follow-Up: At [facility], you and your health needs are our priority.  As part of our continuing mission to provide you with exceptional heart care, we have created designated Provider Care Teams.  These Care Teams include your primary Cardiologist (physician) and Advanced Practice Providers (APPs -  Physician Assistants and Nurse Practitioners) who all work together to provide you with the care you need, when you need it. You will need a follow up appointment as needed or if symptoms worsen or fail to improve.  

## 2019-01-08 NOTE — Progress Notes (Signed)
Virtual Visit via Video Note   This visit type was conducted due to national recommendations for restrictions regarding the COVID-19 Pandemic (e.g. social distancing) in an effort to limit this patient's exposure and mitigate transmission in our community.  Due to her co-morbid illnesses, this patient is at least at moderate risk for complications without adequate follow up.  This format is felt to be most appropriate for this patient at this time.  All issues noted in this document were discussed and addressed.  A limited physical exam was performed with this format.  Please refer to the patient's chart for her consent to telehealth for [facility].   Evaluation Performed:  Follow-up visit  Date:  01/08/2019   ID:  Jordan Leonard, DOB 1991/06/22, MRN [id number]  Patient Location: Home  Provider Location: Home  PCP:  Bayard Males Hermenia Fiscal, NP  Cardiologist:  No primary care provider on file. Dr Dulce Sellar Electrophysiologist:  None   Chief Complaint:  FU after cardiac echo  History of Present Illness:    Jordan Leonard is a 28 y.o. female who presents via audio/video conferencing for a telehealth visit today.   She is seen by me 11/30/2018 for tachycardia in the setting of type 1 diabetes as well as adrenergic stimulant for attention deficit hyperactivity disorder.  Her hemoglobin was normal at 13.6 and echocardiogram was performed 12/11/2018 that showed normal left and right ventricular function no significant valvular abnormality and no evidence of underlying cardiomyopathy. Recently she has not been aware of her heart beating except when her blood sugars are elevated and she has not been checking her heart rate.  She has not been having edema shortness of breath chest pain or syncope.  We discussed her normal echocardiogram CBC and thyroid studies and offered her reassurance.  I think her rapid heart rate is combination diabetes likely an element of neuropathy combined with her  medications for attention deficit hyperactivity disorder taking both Strattera and Vynase.  She has questions if she should continue on both and I will send a copy of this note to her treating therapist.  The patient does not have symptoms concerning for COVID-19 infection (fever, chills, cough, or new shortness of breath).    Past Medical History:  Diagnosis Date  . ADHD   . Anxiety   . Diabetes mellitus type 1 ([facility])   . STD (sexually transmitted disease)    chlaymdia- 10 years ago   Past Surgical History:  Procedure Laterality Date  . CESAREAN SECTION       No outpatient medications have been marked as taking for the 01/08/19 encounter (Appointment) with Baldo Daub, MD.     Allergies:   Penicillins and Amoxicillin   Social History   Tobacco Use  . Smoking status: Never Smoker  . Smokeless tobacco: Never Used  Substance Use Topics  . Alcohol use: Yes    Comment: rare  . Drug use: No     Family Hx: The patient's family history includes Asthma in her sister. There is no history of Breast cancer, Ovarian cancer, Colon cancer, Diabetes, Heart disease, Diabetes gravidarum, or Diabetes type I.  ROS:   Please see the history of present illness.     All other systems reviewed and are negative.   Prior CV studies:   The following studies were reviewed today:  Echocardiogram 12/21/2018 sinus rhythm normal  Labs/Other Tests and Data Reviewed:    EKG:  An ECG dated 11/30/18 was personally reviewed today and  demonstrated:  [facility] and is normal 97 BPM  Recent Labs: 09/12/2018: TSH 2.640 10/31/2018: BUN 10; Creatinine, Ser 0.79 11/30/2018: Hemoglobin 13.6; Platelets 342   Recent Lipid Panel Lab Results  Component Value Date/Time   CHOL 150 09/12/2018 08:28 AM   TRIG 49 09/12/2018 08:28 AM   HDL 57 09/12/2018 08:28 AM   CHOLHDL 2.6 09/12/2018 08:28 AM   LDLCALC 83 09/12/2018 08:28 AM    Wt Readings from Last 3 Encounters:  11/30/18 182 lb (82.6 kg)  10/31/18 181  lb 6.4 oz (82.3 kg)  10/31/18 180 lb 3.2 oz (81.7 kg)     Objective:    Vital Signs:  There were no vitals taken for this visit.   Well nourished, well developed female in no acute distress.   ASSESSMENT & PLAN:    1. Tachycardia likely related to combined Strattera and Vynase for ADHD.  She wonders if she should be taking both ongoing I will send a note to her treating therapist Silas Floodoslyn Porter nurse practitioner. 2. Type 1 diabetes stable managed by endocrinology 3. Tachycardia secondary to medications no evidence of underlying cardiac disease  COVID-19 Education: The signs and symptoms of COVID-19 were discussed with the patient and how to seek care for testing (follow up with PCP or arrange E-visit).  The importance of social distancing was discussed today.  Time:   Today, I have spent 25 minutes with the patient with telehealth technology discussing the above problems.     Medication Adjustments/Labs and Tests Ordered: Current medicines are reviewed at length with the patient today.  Concerns regarding medicines are outlined above.  Tests Ordered: No orders of the defined types were placed in this encounter.  Medication Changes: No orders of the defined types were placed in this encounter.   Disposition:  Follow up as needed  Signed, Norman HerrlichBrian , MD  01/08/2019 3:51 PM    [facility]

## 2019-01-08 NOTE — Telephone Encounter (Signed)
Ok

## 2019-01-09 ENCOUNTER — Other Ambulatory Visit: Payer: Self-pay | Admitting: Podiatry

## 2019-01-09 MED ORDER — ONDANSETRON HCL 4 MG PO TABS: 4.0000 mg | ORAL_TABLET | Freq: Three times a day (TID) | ORAL | 0 refills | Status: DC | PRN

## 2019-01-09 MED ORDER — CLINDAMYCIN HCL 150 MG PO CAPS: 150.0000 mg | ORAL_CAPSULE | Freq: Three times a day (TID) | ORAL | 0 refills | Status: DC

## 2019-01-09 MED ORDER — OXYCODONE-ACETAMINOPHEN 10-325 MG PO TABS: 1.0000 | ORAL_TABLET | Freq: Four times a day (QID) | ORAL | 0 refills | Status: AC | PRN

## 2019-01-10 ENCOUNTER — Encounter: Payer: Self-pay | Admitting: Podiatry

## 2019-01-10 DIAGNOSIS — R2241 Localized swelling, mass and lump, right lower limb: Secondary | ICD-10-CM | POA: Diagnosis not present

## 2019-01-10 DIAGNOSIS — E109 Type 1 diabetes mellitus without complications: Secondary | ICD-10-CM | POA: Diagnosis not present

## 2019-01-10 DIAGNOSIS — I96 Gangrene, not elsewhere classified: Secondary | ICD-10-CM | POA: Diagnosis not present

## 2019-01-10 DIAGNOSIS — M67471 Ganglion, right ankle and foot: Secondary | ICD-10-CM | POA: Diagnosis not present

## 2019-01-10 DIAGNOSIS — M7989 Other specified soft tissue disorders: Secondary | ICD-10-CM | POA: Diagnosis not present

## 2019-01-16 ENCOUNTER — Encounter: Payer: Self-pay | Admitting: Podiatry

## 2019-01-16 ENCOUNTER — Other Ambulatory Visit: Payer: BLUE CROSS/BLUE SHIELD

## 2019-01-17 ENCOUNTER — Other Ambulatory Visit: Payer: BLUE CROSS/BLUE SHIELD

## 2019-01-17 ENCOUNTER — Other Ambulatory Visit: Payer: Self-pay

## 2019-01-17 ENCOUNTER — Ambulatory Visit (INDEPENDENT_AMBULATORY_CARE_PROVIDER_SITE_OTHER): Payer: BLUE CROSS/BLUE SHIELD | Admitting: Podiatry

## 2019-01-17 ENCOUNTER — Encounter: Payer: Self-pay | Admitting: Podiatry

## 2019-01-17 VITALS — BP 120/78 | HR 92 | Temp 97.0°F

## 2019-01-17 DIAGNOSIS — M7989 Other specified soft tissue disorders: Secondary | ICD-10-CM

## 2019-01-17 DIAGNOSIS — Z9889 Other specified postprocedural states: Secondary | ICD-10-CM

## 2019-01-17 NOTE — Progress Notes (Signed)
She presents today date of surgery 01/10/2019 excision soft tissue lesion plantar aspect second digit of the right foot.  She denies fever chills nausea vomiting muscle aches pains calf pain back pain chest pain shortness of breath.  Objective: Vital signs are stable alert oriented x3 dressed her dressing intact was removed demonstrates no erythema edema cellulitis drainage odor sutures are intact second digit margins appear to be coapting.  Pathology report does demonstrating a palisading granuloma most likely secondary to foreign body.  Assessment: Well-healing surgical toe second right.  Plan: Redressed today dressed a compressive dressing follow-up with her in 1 week for possible suture removal.

## 2019-01-23 ENCOUNTER — Other Ambulatory Visit: Payer: BLUE CROSS/BLUE SHIELD

## 2019-01-23 ENCOUNTER — Encounter: Payer: Self-pay | Admitting: Podiatry

## 2019-01-23 ENCOUNTER — Ambulatory Visit (INDEPENDENT_AMBULATORY_CARE_PROVIDER_SITE_OTHER): Payer: BLUE CROSS/BLUE SHIELD | Admitting: Podiatry

## 2019-01-23 ENCOUNTER — Other Ambulatory Visit: Payer: Self-pay

## 2019-01-23 VITALS — Temp 97.8°F

## 2019-01-23 DIAGNOSIS — Z9889 Other specified postprocedural states: Secondary | ICD-10-CM

## 2019-01-23 DIAGNOSIS — M7989 Other specified soft tissue disorders: Secondary | ICD-10-CM | POA: Diagnosis not present

## 2019-01-23 DIAGNOSIS — E109 Type 1 diabetes mellitus without complications: Secondary | ICD-10-CM | POA: Diagnosis not present

## 2019-01-23 NOTE — Progress Notes (Signed)
She presents today for second postop visit date of surgery 01/10/2019 excision tumor second toe states that is tender today but all in all it seems to be doing better.  She denies fever chills nausea vomiting muscle aches pains calf pain back pain chest pain shortness of breath states her blood sugars been a little bit elevated.  Objective: Vital signs are stable alert oriented x3 dressed her dressing intact once removed demonstrates no erythema mild edema no cellulitis drainage or odor sutures are intact margins well coapted appears to be healing very nicely.  Assessment: Well-healing surgical toe second right.  Plan: I demonstrated to her today how to dress the toe with Covan and she will continue to do so for the next couple weeks.  I will follow-up with her in 2 weeks to make sure she is doing well we also dispensed a Darco shoe versus the cam walker.  Should she have questions or concerns she will notify us immediately.

## 2019-01-29 ENCOUNTER — Ambulatory Visit: Payer: BLUE CROSS/BLUE SHIELD | Admitting: Cardiovascular Disease

## 2019-01-30 ENCOUNTER — Other Ambulatory Visit: Payer: BLUE CROSS/BLUE SHIELD

## 2019-02-06 ENCOUNTER — Ambulatory Visit (INDEPENDENT_AMBULATORY_CARE_PROVIDER_SITE_OTHER): Payer: BLUE CROSS/BLUE SHIELD | Admitting: Podiatry

## 2019-02-06 ENCOUNTER — Other Ambulatory Visit: Payer: BLUE CROSS/BLUE SHIELD

## 2019-02-06 ENCOUNTER — Encounter: Payer: Self-pay | Admitting: Podiatry

## 2019-02-06 ENCOUNTER — Other Ambulatory Visit: Payer: Self-pay

## 2019-02-06 VITALS — Temp 98.8°F

## 2019-02-06 DIAGNOSIS — Z9889 Other specified postprocedural states: Secondary | ICD-10-CM

## 2019-02-06 DIAGNOSIS — M7989 Other specified soft tissue disorders: Secondary | ICD-10-CM

## 2019-02-06 NOTE — Progress Notes (Signed)
She presents today date of surgery 01/10/2019 excision tumor second digit right foot states that it burns when water touches it.  Objective: Vital signs are stable alert and oriented x3.  Pulses are palpable.  Foot has a small epidermal dehiscence but there is no deep dehiscence this is appears to be scab related for more the sutures were removed.  She has no tenderness on palpation of the area.  Assessment: She also has some anesthesia neuralgia or possibly neuropathy to toes 345 the right foot otherwise second toe is healing very nicely.  Plan: Cover during the day leave open at bedtime I will follow-up with her in 2 weeks if necessary.

## 2019-02-07 ENCOUNTER — Other Ambulatory Visit: Payer: BLUE CROSS/BLUE SHIELD

## 2019-02-18 ENCOUNTER — Other Ambulatory Visit: Payer: Self-pay

## 2019-02-18 ENCOUNTER — Encounter: Payer: Self-pay | Admitting: Endocrinology

## 2019-02-18 MED ORDER — GLUCOSE BLOOD VI STRP: ORAL_STRIP | 12 refills | Status: AC

## 2019-02-20 ENCOUNTER — Other Ambulatory Visit: Payer: BLUE CROSS/BLUE SHIELD

## 2019-02-21 ENCOUNTER — Other Ambulatory Visit: Payer: BLUE CROSS/BLUE SHIELD

## 2019-02-22 DIAGNOSIS — E109 Type 1 diabetes mellitus without complications: Secondary | ICD-10-CM | POA: Diagnosis not present

## 2019-03-04 ENCOUNTER — Ambulatory Visit (INDEPENDENT_AMBULATORY_CARE_PROVIDER_SITE_OTHER): Payer: BLUE CROSS/BLUE SHIELD | Admitting: Endocrinology

## 2019-03-04 ENCOUNTER — Other Ambulatory Visit: Payer: Self-pay

## 2019-03-04 ENCOUNTER — Encounter: Payer: Self-pay | Admitting: Endocrinology

## 2019-03-04 DIAGNOSIS — E109 Type 1 diabetes mellitus without complications: Secondary | ICD-10-CM | POA: Diagnosis not present

## 2019-03-04 NOTE — Progress Notes (Signed)
Subjective:    Patient ID: Jordan Leonard, female    DOB: Dec 06, 1990, 28 y.o.   MRN: [id number]  HPI telehealth visit today via doxy video visit.  Alternatives to telehealth are presented to this patient, and the patient agrees to the telehealth visit. Pt is advised of the cost of the visit, and agrees to this, also.   Patient is at home, and I am at the office.   Pt returns for f/u of diabetes mellitus:  DM type: 1 Dx'ed: 3976 Complications: none Therapy: insulin since dx, and pump rx since 2005 (Omnipod since 2019).   GDM: never.  DKA: only once, at dx Severe hypoglycemia: never.  Pancreatitis: never Pancreatic imaging: never.  Other: she uses Freestyle Libre continuous glucose monitor, and Onmipod pump; she is not at risk for pregnancy now; she eats just 1 meal per day.   Interval history:  She takes these pump settings:  basal rate of 1.1 units/hr, 6 AM-9PM, and 1.25/HR at other times.    mealtime bolus of 1 unit/4 grams carbohydrate at all times of day.   correction bolus (which some people call "sensitivity," or "insulin sensitivity ratio," or just "isr") of 1 unit for each 55 by which glucose exceeds 100.   She has mild hypoglycemia approx twice per month.  Toe surg went well.   I reviewed continuous glucose monitor data.  Glucose varies from 60-370.  It is in general highest in the afternoon.   Past Medical History:  Diagnosis Date  . ADHD   . Anxiety   . Diabetes mellitus type 1 ([place])   . History of cesarean section 09/01/2016  . History of chlamydia infection 09/01/2016   2007  . STD (sexually transmitted disease)    chlaymdia- 10 years ago    Past Surgical History:  Procedure Laterality Date  . CESAREAN SECTION      Social History   Socioeconomic History  . Marital status: Married    Spouse name: Not on file  . Number of children: Not on file  . Years of education: Not on file  . Highest education level: Not on file  Occupational History  . Not on  file  Social Needs  . Financial resource strain: Not on file  . Food insecurity:    Worry: Not on file    Inability: Not on file  . Transportation needs:    Medical: Not on file    Non-medical: Not on file  Tobacco Use  . Smoking status: Never Smoker  . Smokeless tobacco: Never Used  Substance and Sexual Activity  . Alcohol use: Yes    Comment: rare  . Drug use: No  . Sexual activity: Yes    Birth control/protection: I.U.D.  Lifestyle  . Physical activity:    Days per week: 0 days    Minutes per session: 0 min  . Stress: Not on file  Relationships  . Social connections:    Talks on phone: Not on file    Gets together: Not on file    Attends religious service: Not on file    Active member of club or organization: Not on file    Attends meetings of clubs or organizations: Not on file    Relationship status: Not on file  . Intimate partner violence:    Fear of current or ex partner: No    Emotionally abused: No    Physically abused: No    Forced sexual activity: No  Other Topics Concern  .  Not on file  Social History Narrative   Married.    1 child.   Works at [employer].   Enjoys being with family, working on mind puzzles    Current Outpatient Medications on File Prior to Visit  Medication Sig Dispense Refill  . atomoxetine (STRATTERA) 60 MG capsule Take 60 mg by mouth daily.    . clonazePAM (KLONOPIN) 0.5 MG tablet Take 1 tablet by mouth 2 (two) times daily as needed.   0  . glucagon (GLUCAGON EMERGENCY) 1 MG injection 1 mg as needed.     Marland Kitchen glucose blood (FREESTYLE PRECISION NEO TEST) test strip Use to monitor glucose levels 8 times per day; E11.9 100 each 12  . insulin aspart (NOVOLOG) 100 UNIT/ML injection USE VIA INSULIN PUMP. MAX OF 80 UNITS PER DAY. 30 mL 11  . Insulin Disposable Pump (OMNIPOD DASH SYSTEM) KIT by Does not apply route.    . ondansetron (ZOFRAN) 4 MG tablet Take 1 tablet (4 mg total) by mouth every 8 (eight) hours as needed for nausea  or vomiting. 20 tablet 0  . VYVANSE 20 MG capsule Take 20 mg by mouth daily.      No current facility-administered medications on file prior to visit.     Allergies  Allergen Reactions  . Amoxicillin Anaphylaxis  . Penicillins Anaphylaxis    Family History  Problem Relation Age of Onset  . Asthma Sister   . Multiple sclerosis Sister   . Breast cancer Neg Hx   . Ovarian cancer Neg Hx   . Colon cancer Neg Hx   . Diabetes Neg Hx   . Heart disease Neg Hx   . Diabetes gravidarum Neg Hx   . Diabetes type I Neg Hx     LMP 02/07/2019   Review of Systems She denies LOC    Objective:   Physical Exam      Assessment & Plan:  Type 1 DM: this is the best control this pt should aim for, given variable glucoses. Hypoglycemia: This limits aggressiveness of glycemic control. Please continue the same pump settings.

## 2019-03-04 NOTE — Patient Instructions (Addendum)
check your blood sugar 8 times a day.  vary the time of day when you check, between before the 3 meals, and at bedtime.  also check if you have symptoms of your blood sugar being too high or too low.  please keep a record of the readings and bring it to your next appointment here (or you can bring the meter itself).  You can write it on any piece of paper.  please call us sooner if your blood sugar goes below 70, or if you have a lot of readings over 200.  Please message [place], with cbg info.   basal rate of 1.1 units/hr, 6 AM-9PM, and 1.25/HR at other times.     mealtime bolus of 1 unit/4 grams carbohydrate at all times of day.   correction bolus (which some people call "sensitivity," or "insulin sensitivity ratio," or just "isr") of 1 unit for each 55 by which your glucose exceeds 100.  If you want, we can consider adding "Trulicity," or "Ozempic."   Please come back for a follow-up appointment in 1-2 months.

## 2019-03-05 ENCOUNTER — Other Ambulatory Visit: Payer: Self-pay

## 2019-03-05 ENCOUNTER — Encounter: Payer: Self-pay | Admitting: Primary Care

## 2019-03-05 ENCOUNTER — Ambulatory Visit: Payer: BC Managed Care – PPO | Admitting: Primary Care

## 2019-03-05 VITALS — BP 122/80 | HR 104 | Temp 98.3°F | Ht 60.5 in | Wt 185.5 lb

## 2019-03-05 DIAGNOSIS — E109 Type 1 diabetes mellitus without complications: Secondary | ICD-10-CM

## 2019-03-05 DIAGNOSIS — F411 Generalized anxiety disorder: Secondary | ICD-10-CM | POA: Insufficient documentation

## 2019-03-05 DIAGNOSIS — F339 Major depressive disorder, recurrent, unspecified: Secondary | ICD-10-CM | POA: Diagnosis not present

## 2019-03-05 DIAGNOSIS — F909 Attention-deficit hyperactivity disorder, unspecified type: Secondary | ICD-10-CM

## 2019-03-05 MED ORDER — HYDROXYZINE HCL 10 MG PO TABS: 10.0000 mg | ORAL_TABLET | Freq: Every day | ORAL | 0 refills | Status: DC | PRN

## 2019-03-05 NOTE — Assessment & Plan Note (Signed)
Previously following with psychiatry, overall doing okay with PRN hydroxyzine. Referral placed for new psychiatrist.

## 2019-03-05 NOTE — Assessment & Plan Note (Signed)
Following with psychiatry until just recently, is not taking Vyvanse or Strattera now. Referral placed to psychiatry for further treatment.

## 2019-03-05 NOTE — Progress Notes (Signed)
Subjective:    Patient ID: Jordan Leonard, female    DOB: [birth year], 28 y.o.   MRN: [id number]  HPI  Jordan Leonard is a 28 year old female who presents today to establish care and discuss the problems mentioned below. Will obtain/review records.  1) ADHD/Anxiety Disorder/Depression: Currently managed on Strattera 60 mg daily, Vyvanse 20 mg daily, clonazepam 0.5 mg PRN for which she uses for panic attacks, last dose was February 2020.  She is also managed on hydroxyzine 10 mg for which she uses for hives, this will occur when she's feeling anxious.  Previously following with psychiatry in [place], [place] but her provider left the practice. She is looking for another psychiatrist now. She has not been taking her Strattera and Vyvanse since working at home during [place], overall feels well off of medications. She does still experience anxiety with hives and will take hydroxyzine as needed. She is needing a refill today.  2) Type 1 Diabetes: Diagnosed in 2000. Currently managed on insulin pump and is following with Dr. Loanne Drilling with endocrinology. She is following with podiatry, recently had surgery in April 2020 for removal of foreign object to the right second digit.  Review of Systems  Constitutional: Negative for fatigue.  Respiratory: Negative for shortness of breath.   Cardiovascular: Negative for chest pain.  Neurological: Negative for dizziness and headaches.  Psychiatric/Behavioral:       See HPI       Past Medical History:  Diagnosis Date  . ADHD   . Anxiety   . Diabetes mellitus type 1 ([place])   . STD (sexually transmitted disease)    chlaymdia- 10 years ago     Social History   Socioeconomic History  . Marital status: Married    Spouse name: Not on file  . Number of children: Not on file  . Years of education: Not on file  . Highest education level: Not on file  Occupational History  . Not on file  Social Needs  . Financial resource strain: Not on file  . Food  insecurity:    Worry: Not on file    Inability: Not on file  . Transportation needs:    Medical: Not on file    Non-medical: Not on file  Tobacco Use  . Smoking status: Never Smoker  . Smokeless tobacco: Never Used  Substance and Sexual Activity  . Alcohol use: Yes    Comment: rare  . Drug use: No  . Sexual activity: Yes    Birth control/protection: I.U.D.  Lifestyle  . Physical activity:    Days per week: 0 days    Minutes per session: 0 min  . Stress: Not on file  Relationships  . Social connections:    Talks on phone: Not on file    Gets together: Not on file    Attends religious service: Not on file    Active member of club or organization: Not on file    Attends meetings of clubs or organizations: Not on file    Relationship status: Not on file  . Intimate partner violence:    Fear of current or ex partner: No    Emotionally abused: No    Physically abused: No    Forced sexual activity: No  Other Topics Concern  . Not on file  Social History Narrative   Married.    1 child.   Works at [employer].   Enjoys being with family, working on mind puzzles  Past Surgical History:  Procedure Laterality Date  . CESAREAN SECTION      Family History  Problem Relation Age of Onset  . Asthma Sister   . Multiple sclerosis Sister   . Breast cancer Neg Hx   . Ovarian cancer Neg Hx   . Colon cancer Neg Hx   . Diabetes Neg Hx   . Heart disease Neg Hx   . Diabetes gravidarum Neg Hx   . Diabetes type I Neg Hx     Allergies  Allergen Reactions  . Amoxicillin Anaphylaxis  . Penicillins Anaphylaxis    Current Outpatient Medications on File Prior to Visit  Medication Sig Dispense Refill  . atomoxetine (STRATTERA) 60 MG capsule Take 60 mg by mouth daily.    . clonazePAM (KLONOPIN) 0.5 MG tablet Take 1 tablet by mouth 2 (two) times daily as needed.   0  . glucagon (GLUCAGON EMERGENCY) 1 MG injection 1 mg as needed.     Marland Kitchen glucose blood (FREESTYLE PRECISION  NEO TEST) test strip Use to monitor glucose levels 8 times per day; E11.9 100 each 12  . insulin aspart (NOVOLOG) 100 UNIT/ML injection USE VIA INSULIN PUMP. MAX OF 80 UNITS PER DAY. 30 mL 11  . Insulin Disposable Pump (OMNIPOD DASH SYSTEM) KIT by Does not apply route.    . ondansetron (ZOFRAN) 4 MG tablet Take 1 tablet (4 mg total) by mouth every 8 (eight) hours as needed for nausea or vomiting. 20 tablet 0  . VYVANSE 20 MG capsule Take 20 mg by mouth daily.      No current facility-administered medications on file prior to visit.     BP 122/80   Pulse (!) 104   Temp 98.3 F (36.8 C) (Tympanic)   Ht 5' 0.5" (1.537 m)   Wt 185 lb 8 oz (84.1 kg)   LMP 02/07/2019   SpO2 98%   BMI 35.63 kg/m    Objective:   Physical Exam  Constitutional: She appears well-nourished.  Neck: Neck supple.  Cardiovascular: Normal rate and regular rhythm.  Respiratory: Effort normal and breath sounds normal.  Skin: Skin is warm and dry.  Psychiatric: She has a normal mood and affect.           Assessment & Plan:

## 2019-03-05 NOTE — Telephone Encounter (Signed)
Please review

## 2019-03-05 NOTE — Patient Instructions (Signed)
You will be contacted regarding your referral to psychiatry.  Please let us know if you have not been contacted within one week.   I sent a refill of the hydroxyzine to the pharmacy.   It was a pleasure to meet you today! Please don't hesitate to call or message me with any questions. Welcome to [facility]!

## 2019-03-05 NOTE — Assessment & Plan Note (Signed)
Previously following with psychiatry, overall doing okay with PRN hydroxyzine. Referral placed for new psychiatrist.  

## 2019-03-05 NOTE — Assessment & Plan Note (Signed)
Following with endocrinology and podiatry, managed on pump.

## 2019-03-06 ENCOUNTER — Other Ambulatory Visit: Payer: BLUE CROSS/BLUE SHIELD

## 2019-03-25 DIAGNOSIS — E109 Type 1 diabetes mellitus without complications: Secondary | ICD-10-CM | POA: Diagnosis not present

## 2019-04-01 ENCOUNTER — Other Ambulatory Visit: Payer: Self-pay | Admitting: Primary Care

## 2019-04-01 DIAGNOSIS — F411 Generalized anxiety disorder: Secondary | ICD-10-CM

## 2019-04-02 NOTE — Telephone Encounter (Signed)
Noted.  Refill sent to pharmacy. 

## 2019-04-02 NOTE — Telephone Encounter (Signed)
Ok to change? Last prescribed and seen on 03/05/2019

## 2019-04-10 ENCOUNTER — Other Ambulatory Visit: Payer: Self-pay

## 2019-04-12 ENCOUNTER — Encounter: Payer: Self-pay | Admitting: Endocrinology

## 2019-04-12 ENCOUNTER — Ambulatory Visit (INDEPENDENT_AMBULATORY_CARE_PROVIDER_SITE_OTHER): Payer: BC Managed Care – PPO | Admitting: Endocrinology

## 2019-04-12 ENCOUNTER — Other Ambulatory Visit: Payer: Self-pay

## 2019-04-12 VITALS — BP 128/78 | HR 108 | Ht 60.5 in | Wt 189.4 lb

## 2019-04-12 DIAGNOSIS — E109 Type 1 diabetes mellitus without complications: Secondary | ICD-10-CM

## 2019-04-12 LAB — POCT GLYCOSYLATED HEMOGLOBIN (HGB A1C): Hemoglobin A1C: 7.5 % — AB (ref 4.0–5.6)

## 2019-04-12 NOTE — Progress Notes (Signed)
Subjective:    Patient ID: Jordan Leonard, female    DOB: 07/14/1991, 28 y.o.   MRN: [id number]  HPI Pt returns for f/u of diabetes mellitus:  DM type: 1 Dx'ed: 9242 Complications: none Therapy: insulin since dx, and pump rx since 2005 (Omnipod since 2019).   GDM: never.  DKA: only once, at dx Severe hypoglycemia: never.  Pancreatitis: never Pancreatic imaging: never.  Other: she uses Freestyle Libre continuous glucose monitor, and Onmipod pump; she is not at risk for pregnancy now; she eats just 1 meal per day.   Interval history:  She takes these pump settings:  basal rate of 1.1 units/hr, 6 AM-9PM, and 1.25/HR at other times.     mealtime bolus of 1 unit/4 grams carbohydrate at all times of day.   correction bolus (which some people call "sensitivity," or "insulin sensitivity ratio," or just "isr") of 1 unit for each 55 by which your glucose exceeds 100.   I reviewed continuous glucose monitor data.  Glucose varies from 60-240.  There is no trend throughout the day. She reports a a renewed dietary effort over the past month.  She wants to change to medtronic pump.   TDD is 40 units (65% is basal).   Past Medical History:  Diagnosis Date  . ADHD   . Anxiety   . Diabetes mellitus type 1 ([place])   . History of cesarean section 09/01/2016  . History of chlamydia infection 09/01/2016   2007  . STD (sexually transmitted disease)    chlaymdia- 10 years ago    Past Surgical History:  Procedure Laterality Date  . CESAREAN SECTION      Social History   Socioeconomic History  . Marital status: Married    Spouse name: Not on file  . Number of children: Not on file  . Years of education: Not on file  . Highest education level: Not on file  Occupational History  . Not on file  Social Needs  . Financial resource strain: Not on file  . Food insecurity    Worry: Not on file    Inability: Not on file  . Transportation needs    Medical: Not on file    Non-medical: Not on  file  Tobacco Use  . Smoking status: Never Smoker  . Smokeless tobacco: Never Used  Substance and Sexual Activity  . Alcohol use: Yes    Comment: rare  . Drug use: No  . Sexual activity: Yes    Birth control/protection: I.U.D.  Lifestyle  . Physical activity    Days per week: 0 days    Minutes per session: 0 min  . Stress: Not on file  Relationships  . Social Herbalist on phone: Not on file    Gets together: Not on file    Attends religious service: Not on file    Active member of club or organization: Not on file    Attends meetings of clubs or organizations: Not on file    Relationship status: Not on file  . Intimate partner violence    Fear of current or ex partner: No    Emotionally abused: No    Physically abused: No    Forced sexual activity: No  Other Topics Concern  . Not on file  Social History Narrative   Married.    1 child.   Works at [employer].   Enjoys being with family, working on mind puzzles    Current Outpatient Medications  on File Prior to Visit  Medication Sig Dispense Refill  . atomoxetine (STRATTERA) 60 MG capsule Take 60 mg by mouth daily.    . clonazePAM (KLONOPIN) 0.5 MG tablet Take 1 tablet by mouth 2 (two) times daily as needed.   0  . glucagon (GLUCAGON EMERGENCY) 1 MG injection 1 mg as needed.     Marland Kitchen glucose blood (FREESTYLE PRECISION NEO TEST) test strip Use to monitor glucose levels 8 times per day; E11.9 100 each 12  . hydrOXYzine (ATARAX/VISTARIL) 10 MG tablet TAKE 1 TABLET (10 MG TOTAL) BY MOUTH DAILY AS NEEDED. FOR HIVES/ITCHING. 90 tablet 0  . insulin aspart (NOVOLOG) 100 UNIT/ML injection USE VIA INSULIN PUMP. MAX OF 80 UNITS PER DAY. 30 mL 11  . Insulin Disposable Pump (OMNIPOD DASH SYSTEM) KIT by Does not apply route.    . ondansetron (ZOFRAN) 4 MG tablet Take 1 tablet (4 mg total) by mouth every 8 (eight) hours as needed for nausea or vomiting. 20 tablet 0  . VYVANSE 20 MG capsule Take 20 mg by mouth daily.       No current facility-administered medications on file prior to visit.     Allergies  Allergen Reactions  . Amoxicillin Anaphylaxis  . Penicillins Anaphylaxis    Family History  Problem Relation Age of Onset  . Asthma Sister   . Multiple sclerosis Sister   . Breast cancer Neg Hx   . Ovarian cancer Neg Hx   . Colon cancer Neg Hx   . Diabetes Neg Hx   . Heart disease Neg Hx   . Diabetes gravidarum Neg Hx   . Diabetes type I Neg Hx     BP 128/78 (BP Location: Left Arm, Patient Position: Sitting)   Pulse (!) 108   Ht 5' 0.5" (1.537 m)   Wt 189 lb 6.4 oz (85.9 kg)   SpO2 98%   BMI 36.38 kg/m    Review of Systems Denies LOC.  She has gained weight    Objective:   Physical Exam VITAL SIGNS:  See vs page GENERAL: no distress Pulses: dorsalis pedis intact bilat.   MSK: no deformity of the feet CV: no leg edema Skin:  no ulcer on the feet.  normal color and temp on the feet. Neuro: sensation is intact to touch on the feet  A1c=7.5% Lab Results  Component Value Date   CREATININE 0.79 10/31/2018   BUN 10 10/31/2018   NA 136 12/06/2015   K 4.0 12/06/2015   CL 101 12/06/2015   CO2 25 12/06/2015        Assessment & Plan:  Type 1 DM: worse.   Weight gain: She declines to increase insulin or to add GLP med.   Hypoglycemia: this limits aggressiveness of glycemic control   Patient Instructions  check your blood sugar 8 times a day.  vary the time of day when you check, between before the 3 meals, and at bedtime.  also check if you have symptoms of your blood sugar being too high or too low.  please keep a record of the readings and bring it to your next appointment here (or you can bring the meter itself).  You can write it on any piece of paper.  please call us sooner if your blood sugar goes below 70, or if you have a lot of readings over 200.  Please continue these settings: basal rate of 1.1 units/hr, 6 AM-9PM, and 1.25/HR at other times.     mealtime bolus  of 1  unit/4 grams carbohydrate at all times of day.   correction bolus (which some people call "sensitivity," or "insulin sensitivity ratio," or just "isr") of 1 unit for each 55 by which your glucose exceeds 100.  If you want, we can still consider adding "Trulicity," or "Ozempic."   Please see Vaughan Basta, to consider a Medtronic pump, and continuous glucose monitor.   Please come back for a follow-up appointment in 2-3 months.

## 2019-04-12 NOTE — Patient Instructions (Addendum)
check your blood sugar 8 times a day.  vary the time of day when you check, between before the 3 meals, and at bedtime.  also check if you have symptoms of your blood sugar being too high or too low.  please keep a record of the readings and bring it to your next appointment here (or you can bring the meter itself).  You can write it on any piece of paper.  please call us sooner if your blood sugar goes below 70, or if you have a lot of readings over 200.  Please continue these settings: basal rate of 1.1 units/hr, 6 AM-9PM, and 1.25/HR at other times.     mealtime bolus of 1 unit/4 grams carbohydrate at all times of day.   correction bolus (which some people call "sensitivity," or "insulin sensitivity ratio," or just "isr") of 1 unit for each 55 by which your glucose exceeds 100.  If you want, we can still consider adding "Trulicity," or "Ozempic."   Please see Vaughan Basta, to consider a Medtronic pump, and continuous glucose monitor.   Please come back for a follow-up appointment in 2-3 months.

## 2019-04-24 DIAGNOSIS — E109 Type 1 diabetes mellitus without complications: Secondary | ICD-10-CM | POA: Diagnosis not present

## 2019-05-14 ENCOUNTER — Encounter: Payer: BC Managed Care – PPO | Attending: Endocrinology | Admitting: Nutrition

## 2019-05-14 ENCOUNTER — Other Ambulatory Visit: Payer: Self-pay

## 2019-05-14 DIAGNOSIS — E1065 Type 1 diabetes mellitus with hyperglycemia: Secondary | ICD-10-CM

## 2019-05-14 NOTE — Progress Notes (Signed)
Opened in error

## 2019-05-14 NOTE — Progress Notes (Signed)
Patient has been wearing an OmniPod insulin pump and now the supplies are costing too much.  She says she purchased her OmniPod out of pocket and wants to go with either the TAndem or Medtronic pump.  She was shown both pumps and we discussed the advantages and disadvantages of each model, and sensors, and she was given brochures on each model.  She was encouraged to go on line and review each pump and go into chat rooms to see what people are saying about each one.  She agreed to do this. She had no final questions

## 2019-05-14 NOTE — Patient Instructions (Signed)
Review information given, and go to the web siites for more information on each pump.   Call if questions

## 2019-05-27 DIAGNOSIS — E109 Type 1 diabetes mellitus without complications: Secondary | ICD-10-CM | POA: Diagnosis not present

## 2019-06-12 ENCOUNTER — Other Ambulatory Visit: Payer: Self-pay

## 2019-06-12 ENCOUNTER — Encounter: Payer: Self-pay | Admitting: Psychiatry

## 2019-06-12 ENCOUNTER — Ambulatory Visit (INDEPENDENT_AMBULATORY_CARE_PROVIDER_SITE_OTHER): Payer: Self-pay | Admitting: Psychiatry

## 2019-06-12 DIAGNOSIS — F411 Generalized anxiety disorder: Secondary | ICD-10-CM

## 2019-06-12 DIAGNOSIS — F900 Attention-deficit hyperactivity disorder, predominantly inattentive type: Secondary | ICD-10-CM

## 2019-06-12 MED ORDER — GUANFACINE HCL ER 1 MG PO TB24: 1.0000 mg | ORAL_TABLET | Freq: Every day | ORAL | 1 refills | Status: DC

## 2019-06-12 MED ORDER — HYDROXYZINE HCL 25 MG PO TABS: 12.5000 mg | ORAL_TABLET | Freq: Every day | ORAL | 0 refills | Status: DC | PRN

## 2019-06-12 NOTE — Patient Instructions (Signed)
Guanfacine extended-release oral tablets What is this medicine? GUANFACINE (GWAHN fa seen) is used to treat attention-deficit hyperactivity disorder (ADHD). This medicine may be used for other purposes; ask your health care provider or pharmacist if you have questions. COMMON BRAND NAME(S): Intuniv What should I tell my health care provider before I take this medicine? They need to know if you have any of these conditions:  high blood pressure  kidney disease  liver disease  low blood pressure  slow heart rate  an unusual or allergic reaction to guanfacine, other medicines, foods, dyes, or preservatives  pregnant or trying to get pregnant  breast-feeding How should I use this medicine? Take this medicine by mouth with a glass of water. Follow the directions on the prescription label. Do not cut, crush, or chew this medicine. Do not take this medicine with a high-fat meal. Take your medicine at regular intervals. Do not take it more often than directed. Do not stop taking except on your doctor's advice. Stopping this medicine too quickly may cause serious side effects. Ask your doctor or health care professional for advice. This drug may be prescribed for children as young as 6 years. Talk to your doctor if you have any questions. Overdosage: If you think you have taken too much of this medicine contact a poison control center or emergency room at once. NOTE: This medicine is only for you. Do not share this medicine with others. What if I miss a dose? If you miss a dose, take it as soon as you can. If it is almost time for your next dose, take only that dose. Do not take double or extra doses. If you miss 2 or more doses in a row, you should contact your doctor or health care professional. You may need to restart your medicine at a lower dose. What may interact with this medicine?  certain medicines for blood pressure, heart disease, irregular heart beat  certain medicines for  depression, anxiety, or psychotic disturbances  certain medicines for seizures like carbamazepine, phenobarbital, phenytoin  certain medicines for sleep  ketoconazole  narcotic medicines for pain  rifampin This list may not describe all possible interactions. Give your health care provider a list of all the medicines, herbs, non-prescription drugs, or dietary supplements you use. Also tell them if you smoke, drink alcohol, or use illegal drugs. Some items may interact with your medicine. What should I watch for while using this medicine? Visit your doctor or health care professional for regular checks on your progress. Check your heart rate and blood pressure as directed. Ask your doctor or health care professional what your heart rate and blood pressure should be and when you should contact him or her. You may get dizzy or drowsy. Do not drive, use machinery, or do anything that needs mental alertness until you know how this medicine affects you. Do not stand or sit up quickly, especially if you are an older patient. This reduces the risk of dizzy or fainting spells. Alcohol can make you more drowsy and dizzy. Avoid alcoholic drinks. Avoid becoming dehydrated or overheated while taking this medicine. Tell your healthcare provider if you have been vomiting and cannot take this medicine because you may be at risk for a sudden and large increase in blood pressure called rebound hypertension. Your mouth may get dry. Chewing sugarless gum or sucking hard candy, and drinking plenty of water may help. Contact your doctor if the problem does not go away or is severe. What   side effects may I notice from receiving this medicine? Side effects that you should report to your doctor or health care professional as soon as possible:  allergic reactions like skin rash, itching or hives, swelling of the face, lips, or tongue  changes in emotions or moods  chest pain or chest tightness  signs and symptoms of  low blood pressure like dizziness; feeling faint or lightheaded, falls; unusually weak or tired  unusually slow heartbeat Side effects that usually do not require medical attention (report to your doctor or health care professional if they continue or are bothersome):  drowsiness  dry mouth  headache  nausea  tiredness This list may not describe all possible side effects. Call your doctor for medical advice about side effects. You may report side effects to FDA at 1-800-FDA-1088. Where should I keep my medicine? Keep out of the reach of children. Store at room temperature between 15 and 30 degrees C (59 and 86 degrees F). Throw away any unused medicine after the expiration date. NOTE: This sheet is a summary. It may not cover all possible information. If you have questions about this medicine, talk to your doctor, pharmacist, or health care provider.  2020 Elsevier/Gold Standard (2016-12-27 19:38:26)  

## 2019-06-12 NOTE — Progress Notes (Signed)
Virtual Visit via Video Note  I connected with Jordan Leonard on 06/12/19 at  9:00 AM EDT by a video enabled telemedicine application and verified that I am speaking with the correct person using two identifiers.   I discussed the limitations of evaluation and management by telemedicine and the availability of in person appointments. The patient expressed understanding and agreed to proceed.   I discussed the assessment and treatment plan with the patient. The patient was provided an opportunity to ask questions and all were answered. The patient agreed with the plan and demonstrated an understanding of the instructions.   The patient was advised to call back or seek an in-person evaluation if the symptoms worsen or if the condition fails to improve as anticipated.    Psychiatric Initial Adult Assessment   Patient Identification: Jordan Leonard MRN:  [id number] Date of Evaluation:  06/12/2019 Referral Source: Alma Friendly NP  Chief Complaint:   Chief Complaint    Establish Care; Anxiety     Visit Diagnosis:    ICD-10-CM   1. GAD (generalized anxiety disorder)  F41.1 hydrOXYzine (ATARAX/VISTARIL) 25 MG tablet  2. Attention deficit hyperactivity disorder (ADHD), predominantly inattentive type  F90.0 guanFACINE (INTUNIV) 1 MG TB24 ER tablet    History of Present Illness: Jordan Leonard is a 28 year old Caucasian female, married, employed, lives in [place], has a history of anxiety, depression, ADHD, diabetes mellitus type 1, was evaluated by telemedicine today.  Patient reports she was under the care of [facility] in the past.  She was being treated for ADHD, situational depression and generalized anxiety disorder.  She also reports having panic attacks in the past which she was being treated  for.  She reports she received a letter stating that her provider is no longer with [facility] and hence she transitioned to our clinic.  Patient reports she currently struggles with  anxiety symptoms.  She worries about everything in general.  She often has restlessness and is fidgety.  She reports she also has panic attacks.  She reports usually it happens when she is overwhelmed with stress.  She has racing heart rate, shortness of breath and goes into an anxiety mood when she has these dark thoughts.  She reports the last time she had an episode was few months ago.  She reports she takes clonazepam or hydroxyzine as needed.  She reports it helps with her dark thoughts.  Patient reports she has a history of situational depression.  She however currently denies any depressive symptoms.  She reports sleep is good.  She reports appetite is fair.  She denies any current suicidality, homicidality or perceptual disturbances.  She reports a history of ADHD.  She reports she was diagnosed with few years ago by her previous provider.  She reports she was started on medications like Vyvanse, Strattera in the past.  She reports then she started working from home she stopped taking these medications.  The Vyvanse was helpful at low dosage.  She however developed tachycardia which according to her cardiologist may have been due to her stimulant medication.  She reports the Strattera did help however she developed side effects to Strattera.  She reports feeling drowsy and tired during the day on the Strattera.  She describes her ADHD symptoms as being inattentive, zoning out, inability to comprehend things that she reads and so on.  She reports she did well in school however her symptoms started getting worse when she started college.  Patient denies any history  of trauma.  She does report history of relationship struggles with her family.  She reports she however has started talking to her parents again.  They are working on the relationship at this time.  She also reports some relationship struggles with her husband who is currently dealing with his own family issues and in turn it is  affecting their relationship.  She however reports they still support each other.  Patient denies any substance abuse problems.    Associated Signs/Symptoms: Depression Symptoms:  difficulty concentrating, anxiety, (Hypo) Manic Symptoms:  Denies Anxiety Symptoms:  Excessive Worry, Panic Symptoms, Psychotic Symptoms:  Denies PTSD Symptoms: Negative  Past Psychiatric History: I have reviewed past psychiatric history from medical records received from [facility]-patient with diagnosis of generalized anxiety disorder, ADHD inattentive type.  She was being treated with clonazepam 0.5 mg as needed, Strattera 60 mg daily and Vyvanse 20 mg daily.  Patient denies inpatient mental health admissions.  Patient denies any suicide attempts.  Previous Psychotropic Medications: Yes As summarized above.  Substance Abuse History in the last 12 months:  No.  Consequences of Substance Abuse: Negative  Past Medical History:  Past Medical History:  Diagnosis Date  . ADHD   . Anxiety   . Diabetes mellitus type 1 ([place])   . History of cesarean section 09/01/2016  . History of chlamydia infection 09/01/2016   2007  . STD (sexually transmitted disease)    chlaymdia- 10 years ago    Past Surgical History:  Procedure Laterality Date  . CESAREAN SECTION      Family Psychiatric History: As noted below-depression in mother, anxiety and depression in sister. Family History:  Family History  Problem Relation Age of Onset  . Asthma Sister   . Multiple sclerosis Sister   . Anxiety disorder Sister   . Depression Sister   . Depression Mother   . Breast cancer Neg Hx   . Ovarian cancer Neg Hx   . Colon cancer Neg Hx   . Diabetes Neg Hx   . Heart disease Neg Hx   . Diabetes gravidarum Neg Hx   . Diabetes type I Neg Hx     Social History:   Social History   Socioeconomic History  . Marital status: Married    Spouse name: Not on file  . Number of children: 1  . Years of  education: Not on file  . Highest education level: Not on file  Occupational History  . Not on file  Social Needs  . Financial resource strain: Not on file  . Food insecurity    Worry: Not on file    Inability: Not on file  . Transportation needs    Medical: Not on file    Non-medical: Not on file  Tobacco Use  . Smoking status: Never Smoker  . Smokeless tobacco: Never Used  Substance and Sexual Activity  . Alcohol use: Yes    Comment: rare  . Drug use: No  . Sexual activity: Yes    Birth control/protection: I.U.D.  Lifestyle  . Physical activity    Days per week: 0 days    Minutes per session: 0 min  . Stress: Not on file  Relationships  . Social Herbalist on phone: Not on file    Gets together: Not on file    Attends religious service: Not on file    Active member of club or organization: Not on file    Attends meetings of clubs or  organizations: Not on file    Relationship status: Not on file  Other Topics Concern  . Not on file  Social History Narrative   Married.    1 child.   Works at [employer].   Enjoys being with family, working on mind puzzles    Additional Social History: Patient has been living with her husband since the past 10 years or so.  She has 30 daughter 41-year-old.  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  Patient did  some college-[employer].  Allergies:   Allergies  Allergen Reactions  . Amoxicillin Anaphylaxis  . Penicillins Anaphylaxis    Metabolic Disorder Labs: Lab Results  Component Value Date   HGBA1C 7.5 (A) 04/12/2019   No results found for: PROLACTIN Lab Results  Component Value Date   CHOL 150 09/12/2018   TRIG 49 09/12/2018   HDL 57 09/12/2018   CHOLHDL 2.6 09/12/2018   LDLCALC 83 09/12/2018   LDLCALC 82 09/06/2017   Lab Results  Component Value Date   TSH 2.640 09/12/2018    Therapeutic Level Labs: No results found for: LITHIUM No results found for: CBMZ No  results found for: VALPROATE  Current Medications: Current Outpatient Medications  Medication Sig Dispense Refill  . clonazePAM (KLONOPIN) 0.5 MG tablet Take 1 tablet by mouth 2 (two) times daily as needed.   0  . glucagon (GLUCAGON EMERGENCY) 1 MG injection 1 mg as needed.     Marland Kitchen glucose blood (FREESTYLE PRECISION NEO TEST) test strip Use to monitor glucose levels 8 times per day; E11.9 100 each 12  . guanFACINE (INTUNIV) 1 MG TB24 ER tablet Take 1 tablet (1 mg total) by mouth daily. 30 tablet 1  . hydrOXYzine (ATARAX/VISTARIL) 25 MG tablet Take 0.5-1 tablets (12.5-25 mg total) by mouth daily as needed. Only for severe panic attacks 90 tablet 0  . insulin aspart (NOVOLOG) 100 UNIT/ML injection USE VIA INSULIN PUMP. MAX OF 80 UNITS PER DAY. 30 mL 11  . Insulin Disposable Pump (OMNIPOD DASH SYSTEM) KIT by Does not apply route.    . ondansetron (ZOFRAN) 4 MG tablet Take 1 tablet (4 mg total) by mouth every 8 (eight) hours as needed for nausea or vomiting. 20 tablet 0   No current facility-administered medications for this visit.     Musculoskeletal: Strength & Muscle Tone: UTA Gait & Station: normal Patient leans: N/A  Psychiatric Specialty Exam: Review of Systems  Psychiatric/Behavioral: Positive for depression. The patient is nervous/anxious.   All other systems reviewed and are negative.   There were no vitals taken for this visit.There is no height or weight on file to calculate BMI.  General Appearance: Casual  Eye Contact:  Fair  Speech:  Clear and Coherent  Volume:  Normal  Mood:  Anxious and Depressed  Affect:  Appropriate  Thought Process:  Goal Directed and Descriptions of Associations: Intact  Orientation:  Full (Time, Place, and Person)  Thought Content:  Logical  Suicidal Thoughts:  No  Homicidal Thoughts:  No  Memory:  Immediate;   Fair Recent;   Fair Remote;   Fair  Judgement:  Fair  Insight:  Fair  Psychomotor Activity:  Normal  Concentration:  Concentration:  Fair and Attention Span: Fair  Recall:  [employer] of Knowledge:Fair  Language: Fair  Akathisia:  No  Handed:  Right  AIMS (if indicated):  Denies tremors, rigidity  Assets:  Communication Skills Desire for Improvement Housing Intimacy Social Support  ADL's:  Intact  Cognition: WNL  Sleep:  Fair   Screenings: GAD-7     Office Visit from 06/12/2019 in [facility]  Total GAD-7 Score  15    PHQ2-9     Office Visit from 06/12/2019 in [facility]  PHQ-2 Total Score  0  0      Assessment and Plan: Anisah is a 28 year old Caucasian female who is married, employed, lives in [place], has a history of GAD, ADHD, diabetes mellitus type 1 , tachycardia was evaluated by telemedicine today.  Patient is biologically predisposed given her family history.  She also has psychosocial stressors of relationship struggles, current pandemic.  Patient will benefit from medication readjustment as well as psychotherapy sessions.  Plan GAD- unstable. We will refer her for CBT with therapist Hydroxyzine 12.5 to 25 mg p.o. daily as needed for anxiety attacks She does have a history of using Klonopin as needed.  ADHD-inattentive type- unstable History of being on Vyvanse-however it caused tachycardia Start Intuniv 1 mg p.o. daily  I have reviewed medical records from [facility]-provider Gaylyn Lambert, NP dated 12/11/2018 as summarized above.   I have reviewed TSH in [facility] dated 09/12/2018-within normal limits.  Follow-up in clinic in 3 to 4 weeks or sooner if needed.  October 7 at 3 PM  I have spent atleast 60 minutes non  face to face with patient today. More than 50 % of the time was spent for psychoeducation and supportive psychotherapy and care coordination. This note was generated in part or whole with voice recognition software. Voice recognition is  usually quite accurate but there are transcription errors that can and very often do occur. I apologize for any typographical errors that were not detected and corrected.      Ursula Alert, MD 9/9/202012:31 PM

## 2019-06-17 ENCOUNTER — Telehealth: Payer: Self-pay

## 2019-06-17 NOTE — Telephone Encounter (Signed)
Company: [employer]  Document: CMN Other records requested: office notes and labs  All above requested information has been faxed successfully to [employer] listed above. Documents and fax confirmation have been placed in the faxed file for future reference.

## 2019-06-19 DIAGNOSIS — E109 Type 1 diabetes mellitus without complications: Secondary | ICD-10-CM | POA: Diagnosis not present

## 2019-06-19 DIAGNOSIS — H52203 Unspecified astigmatism, bilateral: Secondary | ICD-10-CM | POA: Diagnosis not present

## 2019-06-19 LAB — HM DIABETES EYE EXAM

## 2019-06-21 ENCOUNTER — Other Ambulatory Visit: Payer: Self-pay

## 2019-06-24 ENCOUNTER — Other Ambulatory Visit: Payer: Self-pay | Admitting: Primary Care

## 2019-06-24 DIAGNOSIS — F411 Generalized anxiety disorder: Secondary | ICD-10-CM

## 2019-06-25 ENCOUNTER — Ambulatory Visit (INDEPENDENT_AMBULATORY_CARE_PROVIDER_SITE_OTHER): Payer: BC Managed Care – PPO | Admitting: Endocrinology

## 2019-06-25 ENCOUNTER — Encounter: Payer: Self-pay | Admitting: Endocrinology

## 2019-06-25 ENCOUNTER — Other Ambulatory Visit: Payer: Self-pay

## 2019-06-25 VITALS — BP 120/80 | HR 119 | Ht 60.5 in | Wt 190.8 lb

## 2019-06-25 DIAGNOSIS — E109 Type 1 diabetes mellitus without complications: Secondary | ICD-10-CM | POA: Diagnosis not present

## 2019-06-25 DIAGNOSIS — Z23 Encounter for immunization: Secondary | ICD-10-CM

## 2019-06-25 LAB — POCT GLYCOSYLATED HEMOGLOBIN (HGB A1C): Hemoglobin A1C: 7.1 % — AB (ref 4.0–5.6)

## 2019-06-25 NOTE — Patient Instructions (Addendum)
check your blood sugar 8 times a day.  vary the time of day when you check, between before the 3 meals, and at bedtime.  also check if you have symptoms of your blood sugar being too high or too low.  please keep a record of the readings and bring it to your next appointment here (or you can bring the meter itself).  You can write it on any piece of paper.  please call us sooner if your blood sugar goes below 70, or if you have a lot of readings over 200.  Please take these settings: basal rate of 1.1 units/hr, 6 AM-9PM, and 1.5/HR at other times.   mealtime bolus of 1 unit/5 grams carbohydrate at all times of day.   correction bolus (which some people call "sensitivity," or "insulin sensitivity ratio," or just "isr") of 1 unit for each 50 by which your glucose exceeds 100.   Please come back for a follow-up appointment in 2-3 months.

## 2019-06-25 NOTE — Progress Notes (Signed)
Subjective:    Patient ID: Jordan Leonard, female    DOB: 08-15-1991, 28 y.o.   MRN: [id number]  HPI Pt returns for f/u of diabetes mellitus:  DM type: 1 Dx'ed: 7026 Complications: none Therapy: insulin since dx, and pump rx since 2005 (Omnipod since 2019).   GDM: never.  DKA: only once, at dx Severe hypoglycemia: never.  Pancreatitis: never Pancreatic imaging: never.  Other: she uses Freestyle Libre continuous glucose monitor, and Onmipod pump; she is not at risk for pregnancy now; she eats just 1 meal per day.   Interval history:  She takes these pump settings:  basal rate of 1.1 units/hr, 6 AM-9PM, and 1.5/HR at other times.     mealtime bolus of 1 unit/5 grams carbohydrate at all times of day.   correction bolus (which some people call "sensitivity," or "insulin sensitivity ratio," or just "isr") of 1 unit for each 75 by which your glucose exceeds 100.   I reviewed continuous glucose monitor data.  Glucose varies from 60-290. She wants to change to [employer] pump.   TDD is 40 units (60% is basal).    She decline GLP med.   Past Medical History:  Diagnosis Date  . ADHD   . Anxiety   . Diabetes mellitus type 1 ([place])   . History of cesarean section 09/01/2016  . History of chlamydia infection 09/01/2016   2007  . STD (sexually transmitted disease)    chlaymdia- 10 years ago    Past Surgical History:  Procedure Laterality Date  . CESAREAN SECTION      Social History   Socioeconomic History  . Marital status: Married    Spouse name: Not on file  . Number of children: 1  . Years of education: Not on file  . Highest education level: Not on file  Occupational History  . Not on file  Social Needs  . Financial resource strain: Not on file  . Food insecurity    Worry: Not on file    Inability: Not on file  . Transportation needs    Medical: Not on file    Non-medical: Not on file  Tobacco Use  . Smoking status: Never Smoker  . Smokeless tobacco: Never Used   Substance and Sexual Activity  . Alcohol use: Yes    Comment: rare  . Drug use: No  . Sexual activity: Yes    Birth control/protection: I.U.D.  Lifestyle  . Physical activity    Days per week: 0 days    Minutes per session: 0 min  . Stress: Not on file  Relationships  . Social Herbalist on phone: Not on file    Gets together: Not on file    Attends religious service: Not on file    Active member of club or organization: Not on file    Attends meetings of clubs or organizations: Not on file    Relationship status: Not on file  . Intimate partner violence    Fear of current or ex partner: No    Emotionally abused: No    Physically abused: No    Forced sexual activity: No  Other Topics Concern  . Not on file  Social History Narrative   Married.    1 child.   Works at [employer].   Enjoys being with family, working on mind puzzles    Current Outpatient Medications on File Prior to Visit  Medication Sig Dispense Refill  . clonazePAM (KLONOPIN) 0.5  MG tablet Take 1 tablet by mouth 2 (two) times daily as needed.   0  . glucagon (GLUCAGON EMERGENCY) 1 MG injection 1 mg as needed.     Marland Kitchen glucose blood (FREESTYLE PRECISION NEO TEST) test strip Use to monitor glucose levels 8 times per day; E11.9 100 each 12  . guanFACINE (INTUNIV) 1 MG TB24 ER tablet Take 1 tablet (1 mg total) by mouth daily. 30 tablet 1  . hydrOXYzine (ATARAX/VISTARIL) 25 MG tablet Take 0.5-1 tablets (12.5-25 mg total) by mouth daily as needed. Only for severe panic attacks 90 tablet 0  . insulin aspart (NOVOLOG) 100 UNIT/ML injection USE VIA INSULIN PUMP. MAX OF 80 UNITS PER DAY. 30 mL 11  . Insulin Disposable Pump (OMNIPOD DASH SYSTEM) KIT by Does not apply route.     No current facility-administered medications on file prior to visit.     Allergies  Allergen Reactions  . Amoxicillin Anaphylaxis  . Penicillins Anaphylaxis    Family History  Problem Relation Age of Onset  . Asthma  Sister   . Multiple sclerosis Sister   . Anxiety disorder Sister   . Depression Sister   . Depression Mother   . Breast cancer Neg Hx   . Ovarian cancer Neg Hx   . Colon cancer Neg Hx   . Diabetes Neg Hx   . Heart disease Neg Hx   . Diabetes gravidarum Neg Hx   . Diabetes type I Neg Hx     BP 120/80 (BP Location: Right Arm, Patient Position: Sitting, Cuff Size: Normal)   Pulse (!) 119   Ht 5' 0.5" (1.537 m)   Wt 190 lb 12.8 oz (86.5 kg)   SpO2 98%   BMI 36.65 kg/m   Review of Systems Denies LOC.      Objective:   Physical Exam VITAL SIGNS:  See vs page.   GENERAL: no distress.   Pulses: dorsalis pedis intact bilat.   MSK: no deformity of the feet.   CV: no leg edema.  Skin:  no ulcer on the feet.  normal color and temp on the feet.   Neuro: sensation is intact to touch on the feet.    Lab Results  Component Value Date   HGBA1C 7.1 (A) 06/25/2019   Lab Results  Component Value Date   TSH 2.640 09/12/2018       Assessment & Plan:  Type 1 DM: this is the best control this pt should aim for, given variable cbg's Hypoglycemia: this limits aggressiveness of glycemic control   Patient Instructions  check your blood sugar 8 times a day.  vary the time of day when you check, between before the 3 meals, and at bedtime.  also check if you have symptoms of your blood sugar being too high or too low.  please keep a record of the readings and bring it to your next appointment here (or you can bring the meter itself).  You can write it on any piece of paper.  please call us sooner if your blood sugar goes below 70, or if you have a lot of readings over 200.  Please take these settings: basal rate of 1.1 units/hr, 6 AM-9PM, and 1.5/HR at other times.   mealtime bolus of 1 unit/5 grams carbohydrate at all times of day.   correction bolus (which some people call "sensitivity," or "insulin sensitivity ratio," or just "isr") of 1 unit for each 50 by which your glucose exceeds 100.    Please  come back for a follow-up appointment in 2-3 months.

## 2019-06-26 ENCOUNTER — Telehealth: Payer: Self-pay | Admitting: Nutrition

## 2019-06-26 NOTE — Telephone Encounter (Signed)
Spoke with Jordan Leonard and she said she would like the [employer] ControlIQ  pump with the [place] sensor

## 2019-06-27 ENCOUNTER — Ambulatory Visit: Payer: BC Managed Care – PPO

## 2019-06-27 DIAGNOSIS — E109 Type 1 diabetes mellitus without complications: Secondary | ICD-10-CM | POA: Diagnosis not present

## 2019-06-27 DIAGNOSIS — Z794 Long term (current) use of insulin: Secondary | ICD-10-CM | POA: Diagnosis not present

## 2019-06-27 DIAGNOSIS — E108 Type 1 diabetes mellitus with unspecified complications: Secondary | ICD-10-CM | POA: Diagnosis not present

## 2019-06-27 NOTE — Telephone Encounter (Signed)
Company: [employer]  Document: [facility] for CGM, pump and pump supplies Other records requested: N/A  All above requested information has been faxed successfully to [employer] listed above. Documents and fax confirmation have been placed in the faxed file for future reference.

## 2019-06-28 DIAGNOSIS — Z794 Long term (current) use of insulin: Secondary | ICD-10-CM | POA: Diagnosis not present

## 2019-06-28 DIAGNOSIS — E109 Type 1 diabetes mellitus without complications: Secondary | ICD-10-CM | POA: Diagnosis not present

## 2019-07-01 ENCOUNTER — Encounter: Payer: Self-pay | Admitting: Licensed Clinical Social Worker

## 2019-07-01 ENCOUNTER — Telehealth: Payer: Self-pay

## 2019-07-01 ENCOUNTER — Other Ambulatory Visit: Payer: Self-pay

## 2019-07-01 ENCOUNTER — Ambulatory Visit (INDEPENDENT_AMBULATORY_CARE_PROVIDER_SITE_OTHER): Payer: BC Managed Care – PPO | Admitting: Licensed Clinical Social Worker

## 2019-07-01 DIAGNOSIS — F411 Generalized anxiety disorder: Secondary | ICD-10-CM

## 2019-07-01 DIAGNOSIS — F900 Attention-deficit hyperactivity disorder, predominantly inattentive type: Secondary | ICD-10-CM | POA: Diagnosis not present

## 2019-07-01 DIAGNOSIS — F410 Panic disorder [episodic paroxysmal anxiety] without agoraphobia: Secondary | ICD-10-CM

## 2019-07-01 MED ORDER — ESCITALOPRAM OXALATE 5 MG PO TABS: 5.0000 mg | ORAL_TABLET | Freq: Every day | ORAL | 1 refills | Status: DC

## 2019-07-01 MED ORDER — CLONAZEPAM 0.5 MG PO TABS: 0.5000 mg | ORAL_TABLET | ORAL | 1 refills | Status: DC

## 2019-07-01 NOTE — Telephone Encounter (Signed)
pt states that the intuniv is not working for her last tow day very negative thoughts, crying, sad and depression with no reason.

## 2019-07-01 NOTE — Telephone Encounter (Signed)
Returned call to patient.  She reports she is having these panic attacks, negative thoughts, thinking about worst case scenario.  Hydroxyzine does not help with these thoughts.  It helps with anxiety induced hives.  She was on Klonopin before.  She however takes it only once every 6 months or so.  She does not like taking it every day. She has upcoming therapy visit with Ms. Alden Hipp today.  Start Lexapro 5 mg p.o. daily Start Klonopin 0.5 mg as needed for severe anxiety attacks  Patient reports possible sexual side effects to Intuniv.  Advised her to monitor herself closely and if she has side effects to stop it.  Discussed medications like Wellbutrin for her ADHD however discussed with her that her anxiety symptoms needs to be more under control before Wellbutrin can be started.

## 2019-07-01 NOTE — Progress Notes (Signed)
Comprehensive Clinical Assessment (CCA) Note  07/01/2019 Jordan Leonard [id number]  Visit Diagnosis:      ICD-10-CM   1. GAD (generalized anxiety disorder)  F41.1   2. Attention deficit hyperactivity disorder (ADHD), predominantly inattentive type  F90.0       CCA Part One  Part One has been completed on paper by the patient.  (See scanned document in Chart Review)  CCA Part Two A  Intake/Chief Complaint:  CCA Intake With Chief Complaint CCA Part Two Date: 07/01/19 CCA Part Two Time: 64 Chief Complaint/Presenting Problem: "I started seeing the nurse practicioner that was managing my medications at [facility]. It started with me realizing I felt like I had ADHD. This was maybe three years ago. Right around that same time, I began having some serious issues with my immediate family. I've had a rocky relationship with my parents since my husband and I got together 10 years ago. I thought things would get better and they didn't. I thought when I had my daughter things would get better with my parents. I stopped talking to them for three years." Patients Currently Reported Symptoms/Problems: attention, anxiety, panic attacks, thinking in the worst case scenario Collateral Involvement: n/a Individual's Strengths: good communication Individual's Preferences: n/a Individual's Abilities: good insight Type of Services Patient Feels Are Needed: individual therapy, medication management Initial Clinical Notes/Concerns: none at this time.  Mental Health Symptoms Depression:  Depression: N/A  Mania:  Mania: N/A  Anxiety:   Anxiety: Difficulty concentrating, Fatigue, Irritability, Restlessness, Sleep, Tension, Worrying  Psychosis:  Psychosis: N/A  Trauma:  Trauma: N/A  Obsessions:  Obsessions: N/A  Compulsions:  Compulsions: N/A  Inattention:  Inattention: N/A  Hyperactivity/Impulsivity:  Hyperactivity/Impulsivity: N/A  Oppositional/Defiant Behaviors:  Oppositional/Defiant Behaviors: N/A   Borderline Personality:  Emotional Irregularity: N/A  Other Mood/Personality Symptoms:      Mental Status Exam Appearance and self-care  Stature:  Stature: Average  Weight:  Weight: Overweight  Clothing:  Clothing: Neat/clean  Grooming:  Grooming: Normal  Cosmetic use:  Cosmetic Use: Age appropriate  Posture/gait:  Posture/Gait: Normal  Motor activity:  Motor Activity: Not Remarkable  Sensorium  Attention:  Attention: Normal  Concentration:  Concentration: Normal  Orientation:  Orientation: X5  Recall/memory:  Recall/Memory: Normal  Affect and Mood  Affect:  Affect: Appropriate  Mood:  Mood: Anxious  Relating  Eye contact:  Eye Contact: Normal  Facial expression:  Facial Expression: Anxious  Attitude toward examiner:  Attitude Toward Examiner: Cooperative  Thought and Language  Speech flow: Speech Flow: Normal  Thought content:  Thought Content: Appropriate to mood and circumstances  Preoccupation:     Hallucinations:     Organization:     Transport planner of Knowledge:     Intelligence:  Intelligence: Average  Abstraction:  Abstraction: Normal  Judgement:  Judgement: Normal  Reality Testing:  Reality Testing: Realistic  Insight:  Insight: Good  Decision Making:  Decision Making: Normal  Social Functioning  Social Maturity:  Social Maturity: Responsible  Social Judgement:  Social Judgement: Normal  Stress  Stressors:  Stressors: Transitions  Coping Ability:  Coping Ability: English as a second language teacher Deficits:     Supports:      Family and Psychosocial History: Family history Marital status: Married Number of Years Married: 35 What types of issues is patient dealing with in the relationship?: Pt reports her parents do not like her husband, which has caused a lot of problems between her and her parents & sister. Additional relationship information: none  at this time. Are you sexually active?: Yes What is your sexual orientation?: heterosexual Has your sexual  activity been affected by drugs, alcohol, medication, or emotional stress?: n/a Does patient have children?: Yes How many children?: 1 How is patient's relationship with their children?: daughter, age 675, "to me I feel like I could be a better mom because my ADHD and anxiety get in the middle. I get overwhelmed and overstimulated."  Childhood History:  Childhood History By whom was/is the patient raised?: Both parents Additional childhood history information: Pt reports cutting her parents and sister out of her life three years ago. Description of patient's relationship with caregiver when they were a child: Mom and dad: "It was always the four of us (mom, dad, pt, and sister). My grandmother was murdered when I was [age]. It was drilled into our heads that it was the four of us against the world." Patient's description of current relationship with people who raised him/her: Pt currently does not speak to her father or mother. How were you disciplined when you got in trouble as a child/adolescent?: "normal stuff." Does patient have siblings?: Yes Number of Siblings: 1 Description of patient's current relationship with siblings: sister: "I cut her out of my life three years ago." Did patient suffer any verbal/emotional/physical/sexual abuse as a child?: No Did patient suffer from severe childhood neglect?: No Has patient ever been sexually abused/assaulted/raped as an adolescent or adult?: No Was the patient ever a victim of a crime or a disaster?: No Witnessed domestic violence?: Yes Has patient been effected by domestic violence as an adult?: No Description of domestic violence: witnessing dad emotionally abuse mother  CCA Part Two B  Employment/Work Situation: Employment / Work Psychologist, occupationalituation Employment situation: Employed Where is patient currently employed?: [employer] How long has patient been employed?: 8 years Patient's job has been impacted by current illness:  No What is the longest time patient has a held a job?: 8 years Where was the patient employed at that time?: current job Did You Receive Any Psychiatric Treatment/Services While in [employer]?: No Are There Guns or Other Weapons in Your Home?: Yes Types of Guns/Weapons: guns Are These ComptrollerWeapons Safely Secured?: Yes  Education: Education School Currently Attending: n/a Last Grade Completed: 12 Name of High School: [employer] Did Garment/textile technologistYou Graduate From [employer]?: Yes Did Theme park managerYou Attend College?: No Did Designer, television/film setYou Attend Graduate School?: No Did You Have Any Special Interests In School?: n/a Did You Have An Individualized Education Program (IIEP): No Did You Have Any Difficulty At School?: Yes Were Any Medications Ever Prescribed For These Difficulties?: Yes Medications Prescribed For School Difficulties?: Vyvanse  Religion: Religion/Spirituality Are You A Religious Person?: No How Might This Affect Treatment?: n/a  Leisure/Recreation: Leisure / Recreation Leisure and Hobbies: "I don't know who I am anymore.  Exercise/Diet: Exercise/Diet Do You Exercise?: No Have You Gained or Lost A Significant Amount of Weight in the Past Six Months?: No Do You Follow a Special Diet?: No Do You Have Any Trouble Sleeping?: Yes Explanation of Sleeping Difficulties: trouble falling and staying asleep  CCA Part Two C  Alcohol/Drug Use: Alcohol / Drug Use Pain Medications: SEE MAR Prescriptions: SEE MAR Over the Counter: SEE MAR History of alcohol / drug use?: No history of alcohol / drug abuse                      CCA Part Three  ASAM's:  Six Dimensions of Multidimensional Assessment  Dimension 1:  Acute Intoxication and/or Withdrawal Potential:     Dimension 2:  Biomedical Conditions and Complications:     Dimension 3:  Emotional, Behavioral, or Cognitive Conditions and Complications:     Dimension 4:  Readiness to Change:     Dimension 5:  Relapse, Continued use, or Continued  Problem Potential:     Dimension 6:  Recovery/Living Environment:      Substance use Disorder (SUD)    Social Function:  Social Functioning Social Maturity: Responsible Social Judgement: Normal  Stress:  Stress Stressors: Transitions Coping Ability: Overwhelmed Patient Takes Medications The Way The Doctor Instructed?: Yes Priority Risk: Low Acuity  Risk Assessment- Self-Harm Potential: Risk Assessment For Self-Harm Potential Thoughts of Self-Harm: No current thoughts Method: No plan Availability of Means: No access/NA Additional Comments for Self-Harm Potential: "I prayed that God would take me away in January 2019."  Risk Assessment -Dangerous to Others Potential: Risk Assessment For Dangerous to Others Potential Method: No Plan Availability of Means: No access or NA Intent: Vague intent or NA Notification Required: No need or identified person Additional Comments for Danger to Others Potential: n/a  DSM5 Diagnoses: Patient Active Problem List   Diagnosis Date Noted  . GAD (generalized anxiety disorder) 03/05/2019  . Recurrent major depressive disorder (HCC) 03/05/2019  . Toe pain, left 08/15/2018  . Breast cyst, right 03/07/2017  . Obesity (BMI 30.0-34.9) 09/01/2016  . Contraception, device intrauterine 09/01/2016  . Attention deficit hyperactivity disorder (ADHD), combined type 09/01/2016  . Presence of insulin pump 04/20/2015  . Type 1 diabetes mellitus (HCC) 09/24/1999    Patient Centered Plan: Patient is on the following Treatment Plan(s):  Anxiety  Recommendations for Services/Supports/Treatments: Recommendations for Services/Supports/Treatments Recommendations For Services/Supports/Treatments: Individual Therapy, Medication Management  Treatment Plan Summary:    Referrals to Alternative Service(s): Referred to Alternative Service(s):   Place:   Date:   Time:    Referred to Alternative Service(s):   Place:   Date:   Time:    Referred to Alternative  Service(s):   Place:   Date:   Time:    Referred to Alternative Service(s):   Place:   Date:   Time:     Heidi Dach, LCSW

## 2019-07-04 ENCOUNTER — Other Ambulatory Visit: Payer: Self-pay | Admitting: Psychiatry

## 2019-07-04 DIAGNOSIS — F900 Attention-deficit hyperactivity disorder, predominantly inattentive type: Secondary | ICD-10-CM

## 2019-07-10 ENCOUNTER — Other Ambulatory Visit: Payer: Self-pay

## 2019-07-10 ENCOUNTER — Encounter: Payer: Self-pay | Admitting: Psychiatry

## 2019-07-10 ENCOUNTER — Ambulatory Visit (INDEPENDENT_AMBULATORY_CARE_PROVIDER_SITE_OTHER): Payer: BC Managed Care – PPO | Admitting: Psychiatry

## 2019-07-10 DIAGNOSIS — F900 Attention-deficit hyperactivity disorder, predominantly inattentive type: Secondary | ICD-10-CM | POA: Insufficient documentation

## 2019-07-10 DIAGNOSIS — F411 Generalized anxiety disorder: Secondary | ICD-10-CM | POA: Diagnosis not present

## 2019-07-10 NOTE — Progress Notes (Signed)
Virtual Visit via Video Note  I connected with Jordan Leonard on 07/10/19 at  3:00 PM EDT by a video enabled telemedicine application and verified that I am speaking with the correct person using two identifiers.   I discussed the limitations of evaluation and management by telemedicine and the availability of in person appointments. The patient expressed understanding and agreed to proceed.    I discussed the assessment and treatment plan with the patient. The patient was provided an opportunity to ask questions and all were answered. The patient agreed with the plan and demonstrated an understanding of the instructions.   The patient was advised to call back or seek an in-person evaluation if the symptoms worsen or if the condition fails to improve as anticipated.   Jordan Leonard OP Progress Note  07/10/2019 3:16 PM Jordan Leonard  MRN:  [id number]  Chief Complaint:  Chief Complaint    Follow-up     HPI: Jordan Leonard is a 28 year old Caucasian female, married, employed, lives in [place], has a history of anxiety, ADHD, diabetes mellitus type 1 was evaluated by telemedicine today.  Patient reports she started taking the Lexapro 10 days ago.  She is currently taking 5 mg.  She reports she has noticed some good benefits from it.  She does not feel very depressed or anxious at this time.  She denies any panic attacks since we last spoke.  She denies any negative thoughts.  She denies any suicidality or homicidality.  She denies any side effects to the Lexapro at this time.  She wants to stay on this dosage.  She continues to work with Ms. Alden Hipp and reports therapy is beneficial.  She has been working on some techniques that her therapist advised her to.  Patient reports sleep is good.  She reports appetite is fair.  Patient denies any suicidality, homicidality or perceptual disturbances. Visit Diagnosis:    ICD-10-CM   1. GAD (generalized anxiety disorder)  F41.1   2.  Attention deficit hyperactivity disorder (ADHD), predominantly inattentive type  F90.0     Past Psychiatric History: I have reviewed past psychiatric history from my progress note on 06/12/2019.  Past trials of Strattera, Vyvanse, Klonopin  Past Medical History:  Past Medical History:  Diagnosis Date  . ADHD   . Anxiety   . Diabetes mellitus type 1 ([place])   . History of cesarean section 09/01/2016  . History of chlamydia infection 09/01/2016   2007  . STD (sexually transmitted disease)    chlaymdia- 10 years ago    Past Surgical History:  Procedure Laterality Date  . CESAREAN SECTION      Family Psychiatric History: I have reviewed family psychiatric history from my progress note on 06/12/2019  Family History:  Family History  Problem Relation Age of Onset  . Asthma Sister   . Multiple sclerosis Sister   . Anxiety disorder Sister   . Depression Sister   . Depression Mother   . Breast cancer Neg Hx   . Ovarian cancer Neg Hx   . Colon cancer Neg Hx   . Diabetes Neg Hx   . Heart disease Neg Hx   . Diabetes gravidarum Neg Hx   . Diabetes type I Neg Hx     Social History: I have reviewed social history from my progress note on 06/12/2019 Social History   Socioeconomic History  . Marital status: Married    Spouse name: Not on file  . Number of children: 1  .  Years of education: Not on file  . Highest education level: Not on file  Occupational History  . Not on file  Social Needs  . Financial resource strain: Not on file  . Food insecurity    Worry: Not on file    Inability: Not on file  . Transportation needs    Medical: Not on file    Non-medical: Not on file  Tobacco Use  . Smoking status: Never Smoker  . Smokeless tobacco: Never Used  Substance and Sexual Activity  . Alcohol use: Yes    Comment: rare  . Drug use: No  . Sexual activity: Yes    Birth control/protection: I.U.D.  Lifestyle  . Physical activity    Days per week: 0 days    Minutes per session:  0 min  . Stress: Not on file  Relationships  . Social Herbalist on phone: Not on file    Gets together: Not on file    Attends religious service: Not on file    Active member of club or organization: Not on file    Attends meetings of clubs or organizations: Not on file    Relationship status: Not on file  Other Topics Concern  . Not on file  Social History Narrative   Married.    1 child.   Works at [employer].   Enjoys being with family, working on mind puzzles    Allergies:  Allergies  Allergen Reactions  . Amoxicillin Anaphylaxis  . Penicillins Anaphylaxis    Metabolic Disorder Labs: Lab Results  Component Value Date   HGBA1C 7.1 (A) 06/25/2019   No results found for: PROLACTIN Lab Results  Component Value Date   CHOL 150 09/12/2018   TRIG 49 09/12/2018   HDL 57 09/12/2018   CHOLHDL 2.6 09/12/2018   LDLCALC 83 09/12/2018   LDLCALC 82 09/06/2017   Lab Results  Component Value Date   TSH 2.640 09/12/2018   TSH 1.830 09/06/2017    Therapeutic Level Labs: No results found for: LITHIUM No results found for: VALPROATE No components found for:  CBMZ  Current Medications: Current Outpatient Medications  Medication Sig Dispense Refill  . clonazePAM (KLONOPIN) 0.5 MG tablet Take 1 tablet (0.5 mg total) by mouth as directed. For severe anxiety attacks up to 2 to 3 times a weeks 12 tablet 1  . escitalopram (LEXAPRO) 5 MG tablet Take 1 tablet (5 mg total) by mouth daily with breakfast. 30 tablet 1  . glucagon (GLUCAGON EMERGENCY) 1 MG injection 1 mg as needed.     Marland Kitchen glucose blood (FREESTYLE PRECISION NEO TEST) test strip Use to monitor glucose levels 8 times per day; E11.9 100 each 12  . hydrOXYzine (ATARAX/VISTARIL) 25 MG tablet Take 0.5-1 tablets (12.5-25 mg total) by mouth daily as needed. Only for severe panic attacks 90 tablet 0  . insulin aspart (NOVOLOG) 100 UNIT/ML injection USE VIA INSULIN PUMP. MAX OF 80 UNITS PER DAY. 30 mL 11  .  Insulin Disposable Pump (OMNIPOD DASH SYSTEM) KIT by Does not apply route.     No current facility-administered medications for this visit.      Musculoskeletal: Strength & Muscle Tone: UTA Gait & Station: normal Patient leans: N/A  Psychiatric Specialty Exam: Review of Systems  Psychiatric/Behavioral: Negative for depression, hallucinations, substance abuse and suicidal ideas. The patient is not nervous/anxious.   All other systems reviewed and are negative.   There were no vitals taken for this visit.There is no  height or weight on file to calculate BMI.  General Appearance: Casual  Eye Contact:  Fair  Speech:  Normal Rate  Volume:  Normal  Mood:  Euthymic  Affect:  Congruent  Thought Process:  Goal Directed and Descriptions of Associations: Intact  Orientation:  Full (Time, Place, and Person)  Thought Content: Logical   Suicidal Thoughts:  No  Homicidal Thoughts:  No  Memory:  Immediate;   Fair Recent;   Fair Remote;   Fair  Judgement:  Fair  Insight:  Fair  Psychomotor Activity:  Normal  Concentration:  Concentration: Fair and Attention Span: Fair  Recall:  [employer] of Knowledge: Fair  Language: Fair  Akathisia:  No  Handed:  Right  AIMS (if indicated): Denies tremors, rigidity  Assets:  Communication Skills Desire for Improvement Housing Social Support  ADL's:  Intact  Cognition: WNL  Sleep:  Fair   Screenings: GAD-7     Office Visit from 06/12/2019 in [place]  Total GAD-7 Score  15    PHQ2-9     Office Visit from 06/12/2019 in [facility]  PHQ-2 Total Score  0  0       Assessment and Plan: Hayslee is a 28 year old Caucasian female who is married, employed, lives in [place], has a history of GAD, ADHD, diabetes type 1, tachycardia was evaluated by telemedicine today.  She is biologically predisposed given her family  history.  She also has psychosocial stressors of relationship struggles, current pandemic.  Patient will benefit from continued medication management and psychotherapy sessions and reports her symptoms as improving.  Plan GAD-improving Continue CBT with Ms. Alden Hipp Continue Lexapro 5 mg p.o. daily Patient does have Klonopin 0.5 mg available as needed only for severe panic attacks  ADHD-inattentive type-chronic Patient is currently not on medications. Discontinued Intuniv due to side effects.  She does have a history of having tachycardia due to stimulant medications.  Follow-up in clinic in 4 weeks or sooner if needed.  November 5 at 3 PM  I have spent atleast 15 minutes non face to face with patient today. More than 50 % of the time was spent for psychoeducation and supportive psychotherapy and care coordination. This note was generated in part or whole with voice recognition software. Voice recognition is usually quite accurate but there are transcription errors that can and very often do occur. I apologize for any typographical errors that were not detected and corrected.       Ursula Alert, Leonard 07/10/2019, 3:16 PM

## 2019-07-23 ENCOUNTER — Other Ambulatory Visit: Payer: Self-pay | Admitting: Psychiatry

## 2019-07-23 DIAGNOSIS — F410 Panic disorder [episodic paroxysmal anxiety] without agoraphobia: Secondary | ICD-10-CM

## 2019-07-30 ENCOUNTER — Ambulatory Visit (INDEPENDENT_AMBULATORY_CARE_PROVIDER_SITE_OTHER): Payer: BC Managed Care – PPO | Admitting: Licensed Clinical Social Worker

## 2019-07-30 ENCOUNTER — Encounter: Payer: Self-pay | Admitting: Licensed Clinical Social Worker

## 2019-07-30 ENCOUNTER — Other Ambulatory Visit: Payer: Self-pay

## 2019-07-30 DIAGNOSIS — F411 Generalized anxiety disorder: Secondary | ICD-10-CM

## 2019-07-30 NOTE — Progress Notes (Signed)
Virtual Visit via Video Note  I connected with Jordan Leonard on 07/30/19 at  8:00 AM EDT by a video enabled telemedicine application and verified that I am speaking with the correct person using two identifiers.   I discussed the limitations of evaluation and management by telemedicine and the availability of in person appointments. The patient expressed understanding and agreed to proceed.  I discussed the assessment and treatment plan with the patient. The patient was provided an opportunity to ask questions and all were answered. The patient agreed with the plan and demonstrated an understanding of the instructions.   The patient was advised to call back or seek an in-person evaluation if the symptoms worsen or if the condition fails to improve as anticipated.  I provided 60 minutes of non-face-to-face time during this encounter.   Alden Hipp, LCSW    THERAPIST PROGRESS NOTE  Session Time: 800  Participation Level: Active  Behavioral Response: NeatAlertAnxious  Type of Therapy: Individual Therapy  Treatment Goals addressed: Coping  Interventions: CBT  Summary: Jordan Leonard is a 28 y.o. female who presents with continued symptoms related to her diagnosis. Jordan Leonard reports doing well since our last session. She reports she has "finally started to notice a difference with the Lexapro." Jordan Leonard reports having a moment with her daughter, during which she would have normally "lost it a little," but she was able to help her daughter stay calm which in turn allowed her to stay calm. LCSW validated the use of coping skills through that moment, and encouraged Jordan Leonard to celebrate the victory. Jordan Leonard reported one negative side effect of the Lexapro has been having no sex drive. LCSW asked if she was able to enjoy being intimate once it is happening. She reports she is able to and wants to be able to have that desire again. We discussed several ways she could increase her sex  drive when she notices herself not being interested. Jordan Leonard expressed understanding and agreement. LCSW also encouraged Jordan Leonard to speak with her doctor about the side effects. Finally, Jordan Leonard reported she has noticed she does not have the energy to do things but her anxiety is still telling her she needs to do them. LCSW encouraged Jordan Leonard to allow herself time to relax when she is able to, and to challenge anxious thoughts as they pop up. We reviewed ways to do this during the day and when she is trying to unwind for the day. Jordan Leonard expressed understanding and agreement.   Suicidal/Homicidal: No  Therapist Response: Cohen continues to work towards her tx goals but has not yet reached them. We will continue to work on emotional regulation skills moving forward.   Plan: Return again in 4 weeks.  Diagnosis: Axis I: Generalized Anxiety Disorder    Axis II: No diagnosis    Alden Hipp, LCSW 07/30/2019

## 2019-08-06 ENCOUNTER — Other Ambulatory Visit: Payer: Self-pay | Admitting: Endocrinology

## 2019-08-08 ENCOUNTER — Other Ambulatory Visit: Payer: Self-pay

## 2019-08-08 ENCOUNTER — Encounter: Payer: Self-pay | Admitting: Psychiatry

## 2019-08-08 ENCOUNTER — Ambulatory Visit (INDEPENDENT_AMBULATORY_CARE_PROVIDER_SITE_OTHER): Payer: BC Managed Care – PPO | Admitting: Psychiatry

## 2019-08-08 DIAGNOSIS — F411 Generalized anxiety disorder: Secondary | ICD-10-CM | POA: Diagnosis not present

## 2019-08-08 DIAGNOSIS — F900 Attention-deficit hyperactivity disorder, predominantly inattentive type: Secondary | ICD-10-CM | POA: Diagnosis not present

## 2019-08-08 NOTE — Progress Notes (Signed)
Virtual Visit via Video Note  I connected with Jordan Leonard on 08/08/19 at  3:00 PM EST by a video enabled telemedicine application and verified that I am speaking with the correct person using two identifiers.   I discussed the limitations of evaluation and management by telemedicine and the availability of in person appointments. The patient expressed understanding and agreed to proceed.    I discussed the assessment and treatment plan with the patient. The patient was provided an opportunity to ask questions and all were answered. The patient agreed with the plan and demonstrated an understanding of the instructions.   The patient was advised to call back or seek an in-person evaluation if the symptoms worsen or if the condition fails to improve as anticipated.   [place] MD OP Progress Note  08/08/2019 5:07 PM Jordan Leonard  MRN:  [id number]  Chief Complaint:  Chief Complaint    Follow-up     WUJ:WJXBJYNW is a 28 year old Caucasian female, married, employed, lives in [place], has a history of anxiety, ADHD, diabetes melitis type I was evaluated by telemedicine today.  Patient today reports she is currently taking Lexapro 5 mg..  She reports the 5 mg is beneficial.  She denies any side effects to the same.  She reports her anxiety symptoms have improved.  She reports she does not feel as nervous or anxious as she used to before.  She had an episode when she could not shut her mind off at least once since her last visit with Probation officer.  She reports she took 1 dosage of Klonopin that day and that is the only time she ever used Klonopin the past 1 month.  Patient reports she has started working with Ms. Alden Hipp more frequently.  She reports therapy sessions is beneficial.  She reports her therapist has been helping her to cope with her anxiety around her daughter who is 78 years old and that has helped a lot.  She reports she currently has a lot of psychosocial stressors  including working from home, taking care of her child and stone.  Patient however reports she has been coping better.  She reports sleep is good.  Patient denies any suicidality, homicidality or perceptual disturbances.   Visit Diagnosis:    ICD-10-CM   1. GAD (generalized anxiety disorder)  F41.1   2. Attention deficit hyperactivity disorder (ADHD), predominantly inattentive type  F90.0     Past Psychiatric History: Reviewed past psychiatric history from my progress note on 06/12/2019.  Past trials of Strattera, Vyvanse, Klonopin Past Medical History:  Past Medical History:  Diagnosis Date  . ADHD   . Anxiety   . Diabetes mellitus type 1 ([place])   . History of cesarean section 09/01/2016  . History of chlamydia infection 09/01/2016   2007  . STD (sexually transmitted disease)    chlaymdia- 10 years ago    Past Surgical History:  Procedure Laterality Date  . CESAREAN SECTION      Family Psychiatric History: Reviewed family psychiatric history from my progress note on 06/12/2019  Family History:  Family History  Problem Relation Age of Onset  . Asthma Sister   . Multiple sclerosis Sister   . Anxiety disorder Sister   . Depression Sister   . Depression Mother   . Breast cancer Neg Hx   . Ovarian cancer Neg Hx   . Colon cancer Neg Hx   . Diabetes Neg Hx   . Heart disease Neg Hx   .  Diabetes gravidarum Neg Hx   . Diabetes type I Neg Hx     Social History: Reviewed social history from my progress note on 06/12/2019 Social History   Socioeconomic History  . Marital status: Married    Spouse name: Not on file  . Number of children: 1  . Years of education: Not on file  . Highest education level: Not on file  Occupational History  . Not on file  Social Needs  . Financial resource strain: Not on file  . Food insecurity    Worry: Not on file    Inability: Not on file  . Transportation needs    Medical: Not on file    Non-medical: Not on file  Tobacco Use  .  Smoking status: Never Smoker  . Smokeless tobacco: Never Used  Substance and Sexual Activity  . Alcohol use: Yes    Comment: rare  . Drug use: No  . Sexual activity: Yes    Birth control/protection: I.U.D.  Lifestyle  . Physical activity    Days per week: 0 days    Minutes per session: 0 min  . Stress: Not on file  Relationships  . Social Herbalist on phone: Not on file    Gets together: Not on file    Attends religious service: Not on file    Active member of club or organization: Not on file    Attends meetings of clubs or organizations: Not on file    Relationship status: Not on file  Other Topics Concern  . Not on file  Social History Narrative   Married.    1 child.   Works at [employer].   Enjoys being with family, working on mind puzzles    Allergies:  Allergies  Allergen Reactions  . Amoxicillin Anaphylaxis  . Penicillins Anaphylaxis    Metabolic Disorder Labs: Lab Results  Component Value Date   HGBA1C 7.1 (A) 06/25/2019   No results found for: PROLACTIN Lab Results  Component Value Date   CHOL 150 09/12/2018   TRIG 49 09/12/2018   HDL 57 09/12/2018   CHOLHDL 2.6 09/12/2018   LDLCALC 83 09/12/2018   LDLCALC 82 09/06/2017   Lab Results  Component Value Date   TSH 2.640 09/12/2018   TSH 1.830 09/06/2017    Therapeutic Level Labs: No results found for: LITHIUM No results found for: VALPROATE No components found for:  CBMZ  Current Medications: Current Outpatient Medications  Medication Sig Dispense Refill  . clonazePAM (KLONOPIN) 0.5 MG tablet Take 1 tablet (0.5 mg total) by mouth as directed. For severe anxiety attacks up to 2 to 3 times a weeks 12 tablet 1  . escitalopram (LEXAPRO) 5 MG tablet TAKE 1 TABLET BY MOUTH EVERY DAY WITH BREAKFAST 90 tablet 1  . glucagon (GLUCAGON EMERGENCY) 1 MG injection 1 mg as needed.     Marland Kitchen glucose blood (FREESTYLE PRECISION NEO TEST) test strip Use to monitor glucose levels 8 times per  day; E11.9 100 each 12  . hydrOXYzine (ATARAX/VISTARIL) 25 MG tablet Take 0.5-1 tablets (12.5-25 mg total) by mouth daily as needed. Only for severe panic attacks 90 tablet 0  . insulin aspart (NOVOLOG) 100 UNIT/ML injection USE VIA INSULIN PUMP. MAX OF 80 UNITS PER DAY. 90 mL 3  . Insulin Disposable Pump (OMNIPOD DASH SYSTEM) KIT by Does not apply route.     No current facility-administered medications for this visit.      Musculoskeletal: Strength & Muscle Tone:  [place]: normal Patient leans: N/A  Psychiatric Specialty Exam: Review of Systems  Psychiatric/Behavioral: Negative for hallucinations, substance abuse and suicidal ideas. The patient is not nervous/anxious and does not have insomnia.   All other systems reviewed and are negative.   There were no vitals taken for this visit.There is no height or weight on file to calculate BMI.  General Appearance: Casual  Eye Contact:  Fair  Speech:  Clear and Coherent  Volume:  Normal  Mood:  Euthymic  Affect:  Appropriate  Thought Process:  Goal Directed and Descriptions of Associations: Intact  Orientation:  Full (Time, Place, and Person)  Thought Content: Logical   Suicidal Thoughts:  No  Homicidal Thoughts:  No  Memory:  Immediate;   Fair Recent;   Fair Remote;   Fair  Judgement:  Fair  Insight:  Fair  Psychomotor Activity:  Normal  Concentration:  Concentration: Fair and Attention Span: Fair  Recall:  [employer] of Knowledge: Fair  Language: Fair  Akathisia:  No  Handed:  Right  AIMS (if indicated): Denies tremors, rigidity  Assets:  Communication Skills Desire for Improvement Housing Intimacy Social Support  ADL's:  Intact  Cognition: WNL  Sleep:  Fair   Screenings: GAD-7     Office Visit from 06/12/2019 in [facility]  Total GAD-7 Score  15    PHQ2-9     Office Visit from 06/12/2019 in [facility]/13/[facility]  PHQ-2 Total Score  0  0       Assessment and Plan: Jordan Leonard is a 28 year old Caucasian female who is married, employed, lives in [place], has a history of GAD, ADHD, diabetes mellitus type 1, tachycardia was evaluated by telemedicine today.  She is biologically predisposed given her family history.  She also has psychosocial stressors of relationship struggles and the current pandemic. She however is making progress.  Plan as noted below.   Plan GAD-improving CBT with Ms. Alden Hipp Lexapro 5 mg p.o. daily Patient does have Klonopin 0.5 mg available as needed only for severe panic attacks  ADHD-inattentive type-chronic She is currently not on medications   Follow-up in clinic in 8 weeks or sooner if needed.  January 5 at 2:40 PM   I have spent atleast 15 minutes non face to face with patient today. More than 50 % of the time was spent for psychoeducation and supportive psychotherapy and care coordination. This note was generated in part or whole with voice recognition software. Voice recognition is usually quite accurate but there are transcription errors that can and very often do occur. I apologize for any typographical errors that were not detected and corrected.     Ursula Alert, MD 08/08/2019, 5:07 PM

## 2019-08-14 ENCOUNTER — Encounter: Payer: Self-pay | Admitting: Obstetrics and Gynecology

## 2019-08-14 ENCOUNTER — Ambulatory Visit (INDEPENDENT_AMBULATORY_CARE_PROVIDER_SITE_OTHER): Payer: BC Managed Care – PPO | Admitting: Obstetrics and Gynecology

## 2019-08-14 ENCOUNTER — Other Ambulatory Visit: Payer: Self-pay

## 2019-08-14 VITALS — BP 124/85 | HR 99 | Ht 60.05 in | Wt 196.5 lb

## 2019-08-14 DIAGNOSIS — F909 Attention-deficit hyperactivity disorder, unspecified type: Secondary | ICD-10-CM | POA: Diagnosis not present

## 2019-08-14 DIAGNOSIS — E109 Type 1 diabetes mellitus without complications: Secondary | ICD-10-CM | POA: Diagnosis not present

## 2019-08-14 DIAGNOSIS — N912 Amenorrhea, unspecified: Secondary | ICD-10-CM

## 2019-08-14 DIAGNOSIS — Z98891 History of uterine scar from previous surgery: Secondary | ICD-10-CM | POA: Diagnosis not present

## 2019-08-14 NOTE — Progress Notes (Signed)
Patient comes in today for pregnancy confirmation. This is her G2P1001. LMP 07/08/2019 and EDD 04/13/2020. Patient is type 1 diabetic and see endocrinology. She has been trying to get pregnant since December 2019.

## 2019-08-14 NOTE — Progress Notes (Signed)
HPI:      Jordan Leonard is a 28 y.o. G2P1001 who LMP was Patient's last menstrual period was 07/08/2019.  Subjective:   She presents today for pregnancy confirmation.  She was attempting pregnancy.  Prior to attempting pregnancy she has discontinued some of her medications.  She remains on Lexapro daily and very occasional use of Klonopin for her ADHD.  She does occasionally use Atarax.  A significant note she is an insulin-dependent diabetic with an insulin pump.  Her hemoglobin A1c was greater than 7 at her last check. Inspecting her sugars briefly today there were several over 150 noted. Her last pregnancy was complicated by failed induction and cesarean delivery.  Patient states that she is "absolutely sure" that she does not want to try that again and simply desires a repeat cesarean delivery.     Hx: The following portions of the patient's history were reviewed and updated as appropriate:             She  has a past medical history of ADHD, Anxiety, Diabetes mellitus type 1 ([place]), History of cesarean section (09/01/2016), History of chlamydia infection (09/01/2016), and STD (sexually transmitted disease). She does not have any pertinent problems on file. She  has a past surgical history that includes Cesarean section. Her family history includes Anxiety disorder in her sister; Asthma in her sister; Depression in her mother and sister; Multiple sclerosis in her sister. She  reports that she has never smoked. She has never used smokeless tobacco. She reports current alcohol use. She reports that she does not use drugs. She has a current medication list which includes the following prescription(s): clonazepam, escitalopram, glucagon, glucose blood, hydroxyzine, insulin aspart, and omnipod dash system. She is allergic to amoxicillin and penicillins.       Review of Systems:  Review of Systems  Constitutional: Denied constitutional symptoms, night sweats, recent illness, fatigue,  fever, insomnia and weight loss.  Eyes: Denied eye symptoms, eye pain, photophobia, vision change and visual disturbance.  Ears/Nose/Throat/Neck: Denied ear, nose, throat or neck symptoms, hearing loss, nasal discharge, sinus congestion and sore throat.  Cardiovascular: Denied cardiovascular symptoms, arrhythmia, chest pain/pressure, edema, exercise intolerance, orthopnea and palpitations.  Respiratory: Denied pulmonary symptoms, asthma, pleuritic pain, productive sputum, cough, dyspnea and wheezing.  Gastrointestinal: Denied, gastro-esophageal reflux, melena, nausea and vomiting.  Genitourinary: Denied genitourinary symptoms including symptomatic vaginal discharge, pelvic relaxation issues, and urinary complaints.  Musculoskeletal: Denied musculoskeletal symptoms, stiffness, swelling, muscle weakness and myalgia.  Dermatologic: Denied dermatology symptoms, rash and scar.  Neurologic: Denied neurology symptoms, dizziness, headache, neck pain and syncope.  Psychiatric: Denied psychiatric symptoms, anxiety and depression.  Endocrine: Denied endocrine symptoms including hot flashes and night sweats.   Meds:   Current Outpatient Medications on File Prior to Visit  Medication Sig Dispense Refill  . clonazePAM (KLONOPIN) 0.5 MG tablet Take 1 tablet (0.5 mg total) by mouth as directed. For severe anxiety attacks up to 2 to 3 times a weeks 12 tablet 1  . escitalopram (LEXAPRO) 5 MG tablet TAKE 1 TABLET BY MOUTH EVERY DAY WITH BREAKFAST 90 tablet 1  . glucagon (GLUCAGON EMERGENCY) 1 MG injection 1 mg as needed.     Marland Kitchen glucose blood (FREESTYLE PRECISION NEO TEST) test strip Use to monitor glucose levels 8 times per day; E11.9 100 each 12  . hydrOXYzine (ATARAX/VISTARIL) 25 MG tablet Take 0.5-1 tablets (12.5-25 mg total) by mouth daily as needed. Only for severe panic attacks 90 tablet 0  .  insulin aspart (NOVOLOG) 100 UNIT/ML injection USE VIA INSULIN PUMP. MAX OF 80 UNITS PER DAY. 90 mL 3  . Insulin  Disposable Pump (OMNIPOD DASH SYSTEM) KIT by Does not apply route.     No current facility-administered medications on file prior to visit.     Objective:     Vitals:   08/14/19 0829  BP: 124/85  Pulse: 99                Assessment:    G2P1001 Patient Active Problem List   Diagnosis Date Noted  . Attention deficit hyperactivity disorder (ADHD), predominantly inattentive type 07/10/2019  . GAD (generalized anxiety disorder) 03/05/2019  . Recurrent major depressive disorder ([place]) 03/05/2019  . Toe pain, left 08/15/2018  . Breast cyst, right 03/07/2017  . Obesity (BMI 30.0-34.9) 09/01/2016  . Attention deficit hyperactivity disorder (ADHD), combined type 09/01/2016  . Presence of insulin pump 04/20/2015  . Type 1 diabetes mellitus ([place]) 09/24/1999     1. Amenorrhea   2. History of cesarean section   3. Type 1 diabetes mellitus without complication ([facility])   4. Attention deficit hyperactivity disorder (ADHD), unspecified ADHD type     Sugars not as well-controlled as desired based on A1c and insulin pump readings.  Have strongly suggested she begin tighter control with higher doses of insulin.  She will discussed this with her endocrinologist.  ADHD discussed and use of Atarax, Lexapro and Klonopin discussed.  Risks versus benefits reviewed.  Small risk of birth defects noted with both Lexapro and Klonopin discussed in detail.  At this time the benefits seem to outweigh the risks based on the patient's history.  First trimester organogenesis discussed in detail.  Possibility of repeat cesarean delivery versus TOLAC discussed.  Patient had a very poor experience with her last pregnancy and does not want to consider induction.  Desires repeat cesarean delivery.     Plan:             Prenatal Plan 1.  The patient was given prenatal literature. 2.  She was continued on prenatal vitamins. 3.  A prenatal lab panel was ordered or drawn. 4.  An ultrasound was ordered to better  determine an EDC. 5.  A nurse visit was scheduled. 6.  Genetic testing and testing for other inheritable conditions discussed in detail. She will decide in the future whether to have these labs performed. 7.  A general overview of pregnancy testing, visit schedule, ultrasound schedule, and prenatal care was discussed. 8.  Benefits of breast-feeding discussed in detail including both maternal and infant benefits. Ready Set Baby website discussed. 9.  Type 1 diabetes not as closely controlled as we would like for obstetrics.  I have discussed tighter control with her and she will discuss this with her endocrinologist and increase her insulin doses.  Attempting to get fastings below 105 and postprandials less than 140. 10.  Have discussed use of Atarax Lexapro and Klonopin in detail.  Patient will use the least possible doses to successfully continue to function.  Risks and benefits discussed of each. 11.  Patient desires repeat cesarean delivery.  Growth scans and NSTs in the third trimester discussed.  Orders Orders Placed This Encounter  Procedures  . US OB Comp Less 14 Wks    No orders of the defined types were placed in this encounter.     F/U  No follow-ups on file. I spent 28 minutes involved in the care of this patient of which greater than  50% was spent discussing see prenatal plan and history of ADHD, insulin-dependent diabetes, prior cesarean delivery.  All of these discussed in much detail.  All questions answered.  Finis Bud, M.D. 08/14/2019 9:09 AM

## 2019-08-19 ENCOUNTER — Telehealth: Payer: Self-pay

## 2019-08-19 DIAGNOSIS — F900 Attention-deficit hyperactivity disorder, predominantly inattentive type: Secondary | ICD-10-CM

## 2019-08-19 DIAGNOSIS — F410 Panic disorder [episodic paroxysmal anxiety] without agoraphobia: Secondary | ICD-10-CM | POA: Insufficient documentation

## 2019-08-19 DIAGNOSIS — Z3A01 Less than 8 weeks gestation of pregnancy: Secondary | ICD-10-CM | POA: Insufficient documentation

## 2019-08-19 DIAGNOSIS — F411 Generalized anxiety disorder: Secondary | ICD-10-CM

## 2019-08-19 NOTE — Telephone Encounter (Signed)
pt states she is pregant and needs to speak with you about her medications.

## 2019-08-19 NOTE — Telephone Encounter (Signed)
Returned call to patient - she reports she is pregnant. Discussed in pregnancy implications of her medications. Discussed stopping klonopin - she takes it rarely as a rescue medication. Discussed staying on Lexapro if she is ok with it. Discussed making use of therapy sessions.

## 2019-08-22 ENCOUNTER — Other Ambulatory Visit: Payer: Self-pay

## 2019-08-22 ENCOUNTER — Ambulatory Visit: Payer: BC Managed Care – PPO

## 2019-08-26 ENCOUNTER — Encounter: Payer: Self-pay | Admitting: Endocrinology

## 2019-08-26 ENCOUNTER — Other Ambulatory Visit: Payer: Self-pay

## 2019-08-26 ENCOUNTER — Ambulatory Visit (INDEPENDENT_AMBULATORY_CARE_PROVIDER_SITE_OTHER): Payer: BC Managed Care – PPO | Admitting: Endocrinology

## 2019-08-26 VITALS — BP 104/62 | HR 108 | Ht 60.5 in | Wt 195.2 lb

## 2019-08-26 DIAGNOSIS — Z331 Pregnant state, incidental: Secondary | ICD-10-CM

## 2019-08-26 DIAGNOSIS — E109 Type 1 diabetes mellitus without complications: Secondary | ICD-10-CM

## 2019-08-26 LAB — POCT GLYCOSYLATED HEMOGLOBIN (HGB A1C): Hemoglobin A1C: 6.6 % — AB (ref 4.0–5.6)

## 2019-08-26 NOTE — Progress Notes (Signed)
Subjective:    Patient ID: Jordan Leonard, female    DOB: 11/28/1990, 28 y.o.   MRN: [id number]  HPI Pt returns for f/u of diabetes mellitus:  DM type: 1 Dx'ed: 1540 Complications: none Therapy: insulin since dx, and pump rx since 2005 (Tandem pump and Dexcom G6 continuous glucose monitor since 2020).   GDM: never.  DKA: only once, at dx Severe hypoglycemia: never.  Pancreatitis: never Pancreatic imaging: never.  Other: she eats meals at 8 AM, noon, and 7 PM.  Interval history:  She takes these pump settings (she made last increase 1 week ago):  basal rate of 1.4 units/hr, 24 HRS per day mealtime bolus of 1 unit/4 grams carbohydrate at all times of day.   correction bolus (which some people call "sensitivity," or "insulin sensitivity ratio," or just "isr") of 1 unit for each 40 by which your glucose exceeds 110.   I reviewed continuous glucose monitor data.  Glucose varies from 40-312.  There is no trend throughout the day.She wants to change to Tandem pump.   TDD is 59 units (60% is basal).    Pt says continuous glucose monitor awakens her at night, approx QOD.   Past Medical History:  Diagnosis Date  . ADHD   . Anxiety   . Diabetes mellitus type 1 ([place])   . History of cesarean section 09/01/2016  . History of chlamydia infection 09/01/2016   2007  . STD (sexually transmitted disease)    chlaymdia- 10 years ago    Past Surgical History:  Procedure Laterality Date  . CESAREAN SECTION      Social History   Socioeconomic History  . Marital status: Married    Spouse name: Not on file  . Number of children: 1  . Years of education: Not on file  . Highest education level: Not on file  Occupational History  . Not on file  Social Needs  . Financial resource strain: Not on file  . Food insecurity    Worry: Not on file    Inability: Not on file  . Transportation needs    Medical: Not on file    Non-medical: Not on file  Tobacco Use  . Smoking status: Never  Smoker  . Smokeless tobacco: Never Used  Substance and Sexual Activity  . Alcohol use: Yes    Comment: rare  . Drug use: No  . Sexual activity: Yes    Birth control/protection: I.U.D.  Lifestyle  . Physical activity    Days per week: 0 days    Minutes per session: 0 min  . Stress: Not on file  Relationships  . Social Herbalist on phone: Not on file    Gets together: Not on file    Attends religious service: Not on file    Active member of club or organization: Not on file    Attends meetings of clubs or organizations: Not on file    Relationship status: Not on file  . Intimate partner violence    Fear of current or ex partner: No    Emotionally abused: No    Physically abused: No    Forced sexual activity: No  Other Topics Concern  . Not on file  Social History Narrative   Married.    1 child.   Works at [employer].   Enjoys being with family, working on mind puzzles    Current Outpatient Medications on File Prior to Visit  Medication Sig Dispense Refill  .  escitalopram (LEXAPRO) 5 MG tablet TAKE 1 TABLET BY MOUTH EVERY DAY WITH BREAKFAST 90 tablet 1  . glucagon (GLUCAGON EMERGENCY) 1 MG injection 1 mg as needed.     Marland Kitchen glucose blood (FREESTYLE PRECISION NEO TEST) test strip Use to monitor glucose levels 8 times per day; E11.9 100 each 12  . hydrOXYzine (ATARAX/VISTARIL) 25 MG tablet Take 0.5-1 tablets (12.5-25 mg total) by mouth daily as needed. Only for severe panic attacks 90 tablet 0  . insulin aspart (NOVOLOG) 100 UNIT/ML injection USE VIA INSULIN PUMP. MAX OF 80 UNITS PER DAY. 90 mL 3  . Insulin Disposable Pump (OMNIPOD DASH SYSTEM) KIT by Does not apply route.     No current facility-administered medications on file prior to visit.     Allergies  Allergen Reactions  . Amoxicillin Anaphylaxis  . Penicillins Anaphylaxis    Family History  Problem Relation Age of Onset  . Asthma Sister   . Multiple sclerosis Sister   . Anxiety disorder  Sister   . Depression Sister   . Depression Mother   . Breast cancer Neg Hx   . Ovarian cancer Neg Hx   . Colon cancer Neg Hx   . Diabetes Neg Hx   . Heart disease Neg Hx   . Diabetes gravidarum Neg Hx   . Diabetes type I Neg Hx     BP 104/62 (BP Location: Right Arm, Patient Position: Sitting, Cuff Size: Large)   Pulse (!) 108   Ht 5' 0.5" (1.537 m)   Wt 195 lb 3.2 oz (88.5 kg)   LMP 07/08/2019   SpO2 99%   BMI 37.49 kg/m   Review of Systems Denies LOC    Objective:   Physical Exam VITAL SIGNS:  See vs page GENERAL: no distress Pulses: dorsalis pedis intact bilat.   MSK: no deformity of the feet CV: no leg edema Skin:  no ulcer on the feet.  normal color and temp on the feet. Neuro: sensation is intact to touch on the feet  Lab Results  Component Value Date   HGBA1C 6.6 (A) 08/26/2019       Assessment & Plan:  Type 1 DM Pregnancy: in this context, she needs more aggressive rx Hypoglycemia: this limits aggressiveness of glycemic control  Patient Instructions  Please take these settings: basal rate of 1.4 units/hr, 24 HRS per day mealtime bolus of 1 unit/3.5 grams carbohydrate at all times of day.   correction bolus (which some people call "sensitivity," or "insulin sensitivity ratio," or just "isr") of 1 unit for each 40 by which your glucose exceeds 110.  Please set the low alarm to 90 mg%, so you would be awakened earlier at night, before it goes low.     Please come back for a follow-up appointment in 4 weeks.

## 2019-08-26 NOTE — Patient Instructions (Addendum)
Please take these settings: basal rate of 1.4 units/hr, 24 HRS per day mealtime bolus of 1 unit/3.5 grams carbohydrate at all times of day.   correction bolus (which some people call "sensitivity," or "insulin sensitivity ratio," or just "isr") of 1 unit for each 40 by which your glucose exceeds 110.  Please set the low alarm to 90 mg%, so you would be awakened earlier at night, before it goes low.     Please come back for a follow-up appointment in 4 weeks.

## 2019-08-28 ENCOUNTER — Ambulatory Visit (INDEPENDENT_AMBULATORY_CARE_PROVIDER_SITE_OTHER): Payer: BC Managed Care – PPO | Admitting: Licensed Clinical Social Worker

## 2019-08-28 ENCOUNTER — Ambulatory Visit (INDEPENDENT_AMBULATORY_CARE_PROVIDER_SITE_OTHER): Payer: BC Managed Care – PPO

## 2019-08-28 ENCOUNTER — Other Ambulatory Visit: Payer: Self-pay

## 2019-08-28 ENCOUNTER — Encounter: Payer: Self-pay | Admitting: Licensed Clinical Social Worker

## 2019-08-28 DIAGNOSIS — F411 Generalized anxiety disorder: Secondary | ICD-10-CM | POA: Diagnosis not present

## 2019-08-28 DIAGNOSIS — Z3687 Encounter for antenatal screening for uncertain dates: Secondary | ICD-10-CM | POA: Diagnosis not present

## 2019-08-28 DIAGNOSIS — F900 Attention-deficit hyperactivity disorder, predominantly inattentive type: Secondary | ICD-10-CM

## 2019-08-28 NOTE — Progress Notes (Signed)
Virtual Visit via Video Note  I connected with Jordan Leonard on 08/28/19 at 10:00 AM EST by a video enabled telemedicine application and verified that I am speaking with the correct person using two identifiers.   I discussed the limitations of evaluation and management by telemedicine and the availability of in person appointments. The patient expressed understanding and agreed to proceed.  I discussed the assessment and treatment plan with the patient. The patient was provided an opportunity to ask questions and all were answered. The patient agreed with the plan and demonstrated an understanding of the instructions.   The patient was advised to call back or seek an in-person evaluation if the symptoms worsen or if the condition fails to improve as anticipated.  I provided 53 minutes of non-face-to-face time during this encounter.   Alden Hipp, Jordan Leonard    THERAPIST PROGRESS NOTE  Session Time: 1000  Participation Level: Active  Behavioral Response: NeatAlertAnxious  Type of Therapy: Individual Therapy  Treatment Goals addressed: Coping  Interventions: CBT and Supportive  Summary: Jordan Leonard is a 28 y.o. female who presents with continued symptoms related to her diagnosis. Jordan Leonard reports doing well since our last session. Jordan Leonard reports she found out she was pregnant, and is feeling incredibly anxious over parts of her pregnancy. "I just feel like I'm not convinced that it's all real." Jordan Leonard reported feeling that throughout her marriage, good things would happen directly followed by a negative event. Because of that, Jordan Leonard reports feeling she is waiting on the other shoe to drop. Jordan Leonard encouraged Jordan Leonard to recognize this in herself, and begin challenging negative thoughts as they come up. Jordan Leonard reviewed how to utilize evidence for and against negative thoughts. Additionally, Jordan Leonard encouraged Jordan Leonard to begin keeping a running list of questions she would like to  ask her doctor in an effort to decrease her anxiety symptoms moving forward. Jordan Leonard expressed understanding and agreement. Jordan Leonard moved on to discussing her daughter, and that she is not listening well recently and seems not to care about punishment. Jordan Leonard validated these feelings and introduced the idea of a reward system to implement change. We discussed figuring out what motivates her daughter, and utilizing that to manage her behaviors. Jordan Leonard went on to discuss her relationship with her father, and how her husband has become more open to having a relationship with her parents. "I just don't know who I am anymore, I do things based on what other people want to do." Jordan Leonard asked Jordan Leonard to begin paying attention to what she likes and doesn't like, and to write out who she is. Jordan Leonard suggested Jordan Leonard actually knows herself better than she thinks she does, but is afraid to take up space with her feelings, opinions, etc because she wants to keep the peace. Jordan Leonard expressed understanding and agreement.   Suicidal/Homicidal: No  Therapist Response: Jordan Leonard continues to work towards her tx goals but has not yet reached them. We will continue to work on improving CBT skills to manage anxiety symptoms, and will continue to work on emotional regulation.   Plan: Return again in 2 weeks.  Diagnosis: Axis I: Generalized Anxiety Disorder    Axis II: No diagnosis    Alden Hipp, Jordan Leonard 08/28/2019

## 2019-09-04 ENCOUNTER — Other Ambulatory Visit: Payer: Self-pay

## 2019-09-04 ENCOUNTER — Ambulatory Visit (INDEPENDENT_AMBULATORY_CARE_PROVIDER_SITE_OTHER): Payer: BC Managed Care – PPO | Admitting: Obstetrics and Gynecology

## 2019-09-04 ENCOUNTER — Other Ambulatory Visit: Payer: Self-pay | Admitting: Surgical

## 2019-09-04 VITALS — BP 119/80 | HR 96 | Ht 60.5 in | Wt 195.6 lb

## 2019-09-04 DIAGNOSIS — Z3491 Encounter for supervision of normal pregnancy, unspecified, first trimester: Secondary | ICD-10-CM

## 2019-09-04 LAB — POCT URINE PREGNANCY: Preg Test, Ur: POSITIVE — AB

## 2019-09-04 MED ORDER — DOXYLAMINE-PYRIDOXINE 10-10 MG PO TBEC: 2.0000 | DELAYED_RELEASE_TABLET | Freq: Every day | ORAL | 5 refills | Status: DC

## 2019-09-04 NOTE — Progress Notes (Signed)
Jordan Leonard presents for NOB nurse interview visit. Pregnancy confirmation done  08-14-2019 . G2. P1001. Pregnancy education material explained and given. 0 cats in home. NOB labs ordered. TSH/HbgA1c ordered due to BMI 30 or greater.  HIV labs and drug screen were explained and ordered. PNV encouraged. Genetic screening options discussed. Genetic testing Unsure. Patient may discuss with the provider. Patient to follow up with provider on 10/02/2019  for NOB physical. All questions answered. Patient would like to nurse if she can produce milk. Last pregnancy not able to produce milk.  Ready set baby discussed.

## 2019-09-04 NOTE — Addendum Note (Signed)
Addended by: Durwin Glaze on: 09/04/2019 09:10 AM   Modules accepted: Orders

## 2019-09-05 LAB — ABO AND RH: Rh Factor: POSITIVE

## 2019-09-05 LAB — HEPATITIS B SURFACE ANTIGEN: Hepatitis B Surface Ag: NEGATIVE

## 2019-09-05 LAB — HIV ANTIBODY (ROUTINE TESTING W REFLEX): HIV Screen 4th Generation wRfx: NONREACTIVE

## 2019-09-05 LAB — VARICELLA ZOSTER ANTIBODY, IGG: Varicella zoster IgG: >4000 index (ref >165)

## 2019-09-05 LAB — ANTIBODY SCREEN: Antibody Screen: NEGATIVE

## 2019-09-05 LAB — RPR: RPR Ser Ql: NONREACTIVE

## 2019-09-05 LAB — RUBELLA SCREEN: Rubella Antibodies, IGG: 1.03 index (ref >0.99)

## 2019-09-11 ENCOUNTER — Telehealth: Payer: Self-pay | Admitting: Psychiatry

## 2019-09-11 ENCOUNTER — Ambulatory Visit (INDEPENDENT_AMBULATORY_CARE_PROVIDER_SITE_OTHER): Payer: BC Managed Care – PPO | Admitting: Licensed Clinical Social Worker

## 2019-09-11 ENCOUNTER — Encounter: Payer: Self-pay | Admitting: Licensed Clinical Social Worker

## 2019-09-11 ENCOUNTER — Other Ambulatory Visit: Payer: Self-pay

## 2019-09-11 DIAGNOSIS — F411 Generalized anxiety disorder: Secondary | ICD-10-CM | POA: Diagnosis not present

## 2019-09-11 NOTE — Telephone Encounter (Signed)
Returned call to patient per request per her therapist Ms.Cecilie Lowers. Patient reports she feels numb and wonders if lexapro is causing it. She had episodes in the past with other antidepressants. Discussed continuing CBT and tapering off of Lexapro . She agrees with plan.

## 2019-09-11 NOTE — Progress Notes (Signed)
Virtual Visit via Telephone Note  I connected with Su Ley on 09/11/19 at  2:30 PM EST by telephone and verified that I am speaking with the correct person using two identifiers.   I discussed the limitations, risks, security and privacy concerns of performing an evaluation and management service by telephone and the availability of in person appointments. I also discussed with the patient that there may be a patient responsible charge related to this service. The patient expressed understanding and agreed to proceed.  I discussed the assessment and treatment plan with the patient. The patient was provided an opportunity to ask questions and all were answered. The patient agreed with the plan and demonstrated an understanding of the instructions.   The patient was advised to call back or seek an in-person evaluation if the symptoms worsen or if the condition fails to improve as anticipated.  I provided 53 minutes of non-face-to-face time during this encounter.   Heidi Dach, LCSW    THERAPIST PROGRESS NOTE  Session Time: 1430  Participation Level: Active  Behavioral Response: NeatAlertAnxious  Type of Therapy: Individual Therapy  Treatment Goals addressed: Anxiety  Interventions: CBT  Summary: NILDA KEATHLEY is a 28 y.o. female who presents with continued symptoms related to her diagnosis. Ashlon reports doing well since our last session. She reports she and her husband were able to buy the house they're living in, and closed on the property recently. She noted the process was extremely stressful, but she is feeling really content now that they closed on the home. LCSW validated Zelda's feelings, and recognized how difficult that entire process can be. Zeanna went on to discuss her pregnancy. At our last session, Adabelle reported feeling very anxious and as though the pregnancy was not real. Since that session, she has had an ultrasound, and was able to take  photos and record the heartbeat on her phone. She states that each time she has doubts about her pregnancy being real, she refers to those photos and recording. LCSW celebrated this use of evidence against an anxious thought, and encouraged Aianna to apply this skill to other anxious thoughts as well. Rayhana expressed understanding and agreement with this information. Keairra stated feeling like her medication has left her feeling disconnected from her body, "I feel like I'm just going through the motions but don't feel anything." LCSW validated this feeling, and encouraged Gianella to talk to her doctor about how she felt in an effort to advocate for herself. LCSW also sent MD a message while on the phone with pt in order to facilitate this process. Finally, Ledia reported feeling disconnected from her husband as he is working nights currently. LCSW encouraged Kaedyn to utilize every resource she has to make her feel more connected--leave notes for her husband, send sweet texts, pack lunches, etc--and likely her husband would respond similarly. Additionally, LCSW encouraged Roselyne to lean into the disconnection by talking to her husband about it and how they could make an effort to improve it. Davinia expressed understanding and agreement with this information as well.   Suicidal/Homicidal: No  Therapist Response: Dazia continues to work towards her tx goals but has not yet reached them. We will continue to work on improving emotional regulation skills and communication moving forward. We will also continue to work on improving CBT skills to assist in managing anxiety symptoms.   Plan: Return again in 2 weeks.  Diagnosis: Axis I: Generalized Anxiety Disorder    Axis II: No diagnosis  Alden Hipp, LCSW 09/11/2019

## 2019-09-13 ENCOUNTER — Encounter: Payer: Self-pay | Admitting: Obstetrics and Gynecology

## 2019-09-13 ENCOUNTER — Encounter: Payer: BLUE CROSS/BLUE SHIELD | Admitting: Obstetrics and Gynecology

## 2019-09-17 ENCOUNTER — Other Ambulatory Visit: Payer: Self-pay

## 2019-09-19 ENCOUNTER — Other Ambulatory Visit: Payer: Self-pay

## 2019-09-19 ENCOUNTER — Ambulatory Visit (INDEPENDENT_AMBULATORY_CARE_PROVIDER_SITE_OTHER): Payer: BC Managed Care – PPO | Admitting: Endocrinology

## 2019-09-19 ENCOUNTER — Encounter: Payer: Self-pay | Admitting: Endocrinology

## 2019-09-19 VITALS — BP 128/74 | HR 105 | Ht 60.5 in | Wt 198.4 lb

## 2019-09-19 DIAGNOSIS — E109 Type 1 diabetes mellitus without complications: Secondary | ICD-10-CM

## 2019-09-19 NOTE — Patient Instructions (Addendum)
Please take these settings: basal rate of 1.5 units/hr, except 1.8 units/HR, 3 AM-6 AM.  mealtime bolus of 1 unit/3.3 grams carbohydrate at all times of day.   correction bolus (which some people call "sensitivity," or "insulin sensitivity ratio," or just "isr") of 1 unit for each 40 by which your glucose exceeds 110.  Please set the low alarm to 90 mg%, so you would be awakened earlier at night, before it goes low.   Blood tests are requested for you today.  We'll let you know about the results.   Please come back for a follow-up appointment in 3 weeks.

## 2019-09-19 NOTE — Progress Notes (Signed)
Subjective:    Patient ID: Jordan Leonard, female    DOB: 31-Aug-1991, 28 y.o.   MRN: [id number]  HPI Pt returns for f/u of diabetes mellitus:  DM type: 1 Dx'ed: 2355 Complications: none Therapy: insulin since dx, and pump rx since 2005 (Tandem pump and Dexcom G6 continuous glucose monitor since 2020).   GDM: never.  DKA: only once, at dx Severe hypoglycemia: never.  Pancreatitis: never Pancreatic imaging: never.  Other: she eats meals at 8 AM, noon, and 7 PM.  Interval history:  She takes these pump settings (she made last increase 1 week ago):  basal rate of 1.5 units/hr, 24 HRS per day mealtime bolus of 1 unit/3.5 grams carbohydrate at all times of day.   correction bolus (which some people call "sensitivity," or "insulin sensitivity ratio," or just "isr") of 1 unit for each 40 by which your glucose exceeds 120.   She is [redacted] weeks pregnant.  She eats meals at 8 AM, 12 PM, and 7 PM.   TDD is 64 units.   I reviewed continuous glucose monitor data.  Glucose varies from 40-270. It decreases from 3 AM-6 MM,  It is in general higher PC than [facility] Past Medical History:  Diagnosis Date  . ADHD   . Anxiety   . Diabetes mellitus type 1 ([place])   . History of cesarean section 09/01/2016  . History of chlamydia infection 09/01/2016   2007  . STD (sexually transmitted disease)    chlaymdia- 10 years ago    Past Surgical History:  Procedure Laterality Date  . CESAREAN SECTION      Social History   Socioeconomic History  . Marital status: Married    Spouse name: Not on file  . Number of children: 1  . Years of education: Not on file  . Highest education level: Not on file  Occupational History  . Not on file  Tobacco Use  . Smoking status: Never Smoker  . Smokeless tobacco: Never Used  Substance and Sexual Activity  . Alcohol use: Yes    Comment: rare  . Drug use: No  . Sexual activity: Yes    Birth control/protection: I.U.D.  Other Topics Concern  . Not on file    Social History Narrative   Married.    1 child.   Works at [employer].   Enjoys being with family, working on mind puzzles   Social Determinants of Radio broadcast assistant Strain:   . Difficulty of Paying Living Expenses: Not on file  Food Insecurity:   . Worried About Charity fundraiser in the Last Year: Not on file  . Ran Out of Food in the Last Year: Not on file  Transportation Needs:   . Lack of Transportation (Medical): Not on file  . Lack of Transportation (Non-Medical): Not on file  Physical Activity:   . Days of Exercise per Week: Not on file  . Minutes of Exercise per Session: Not on file  Stress:   . Feeling of Stress : Not on file  Social Connections:   . Frequency of Communication with Friends and Family: Not on file  . Frequency of Social Gatherings with Friends and Family: Not on file  . Attends Religious Services: Not on file  . Active Member of Clubs or Organizations: Not on file  . Attends Archivist Meetings: Not on file  . Marital Status: Not on file  Intimate Partner Violence:   . Fear of Current  or Ex-Partner: Not on file  . Emotionally Abused: Not on file  . Physically Abused: Not on file  . Sexually Abused: Not on file    Current Outpatient Medications on File Prior to Visit  Medication Sig Dispense Refill  . Doxylamine-Pyridoxine (DICLEGIS) 10-10 MG TBEC Take 2 tablets by mouth at bedtime. If symptoms persist, add one tablet in the morning and one in the afternoon 100 tablet 5  . glucagon (GLUCAGON EMERGENCY) 1 MG injection 1 mg as needed.     Marland Kitchen glucose blood (FREESTYLE PRECISION NEO TEST) test strip Use to monitor glucose levels 8 times per day; E11.9 100 each 12  . hydrOXYzine (ATARAX/VISTARIL) 25 MG tablet Take 0.5-1 tablets (12.5-25 mg total) by mouth daily as needed. Only for severe panic attacks 90 tablet 0  . insulin aspart (NOVOLOG) 100 UNIT/ML injection USE VIA INSULIN PUMP. MAX OF 80 UNITS PER DAY. 90 mL 3   No  current facility-administered medications on file prior to visit.    Allergies  Allergen Reactions  . Amoxicillin Anaphylaxis  . Penicillins Anaphylaxis    Family History  Problem Relation Age of Onset  . Asthma Sister   . Multiple sclerosis Sister   . Anxiety disorder Sister   . Depression Sister   . Depression Mother   . Breast cancer Neg Hx   . Ovarian cancer Neg Hx   . Colon cancer Neg Hx   . Diabetes Neg Hx   . Heart disease Neg Hx   . Diabetes gravidarum Neg Hx   . Diabetes type I Neg Hx     BP 128/74 (BP Location: Left Arm, Patient Position: Sitting, Cuff Size: Large)   Pulse (!) 105   Ht 5' 0.5" (1.537 m)   Wt 198 lb 6.4 oz (90 kg)   LMP 07/08/2019   SpO2 99%   BMI 38.11 kg/m   Review of Systems Denies LOC    Objective:   Physical Exam VITAL SIGNS:  See vs page GENERAL: no distress Pulses: dorsalis pedis intact bilat.   MSK: no deformity of the feet CV: no leg edema Skin:  no ulcer on the feet.  normal color and temp on the feet. Neuro: sensation is intact to touch on the feet Lab Results  Component Value Date   HGBA1C 6.6 (A) 08/26/2019      Assessment & Plan:  Type 1 DM: Based on the pattern of her cbg's, she needs some adjustment in her therapy Hypoglycemia: this limits aggressiveness of glycemic control   Patient Instructions  Please take these settings: basal rate of 1.5 units/hr, except 1.8 units/HR, 3 AM-6 AM.  mealtime bolus of 1 unit/3.3 grams carbohydrate at all times of day.   correction bolus (which some people call "sensitivity," or "insulin sensitivity ratio," or just "isr") of 1 unit for each 40 by which your glucose exceeds 110.  Please set the low alarm to 90 mg%, so you would be awakened earlier at night, before it goes low.   Blood tests are requested for you today.  We'll let you know about the results.   Please come back for a follow-up appointment in 3 weeks.

## 2019-09-20 LAB — BASIC METABOLIC PANEL
BUN: 12 mg/dL (ref 6–23)
CO2: 26 mEq/L (ref 19–32)
Calcium: 9.8 mg/dL (ref 8.4–10.5)
Chloride: 102 mEq/L (ref 96–112)
Creatinine, Ser: 0.62 mg/dL (ref 0.40–1.20)
GFR: 112.53 mL/min (ref >60.00)
Glucose, Bld: 120 mg/dL — ABNORMAL HIGH (ref 70–99)
Potassium: 4.3 mEq/L (ref 3.5–5.1)
Sodium: 136 mEq/L (ref 135–145)

## 2019-09-20 LAB — T4, FREE: Free T4: 0.73 ng/dL (ref 0.60–1.60)

## 2019-09-20 LAB — TSH: TSH: 3.13 u[IU]/mL (ref 0.35–4.50)

## 2019-09-21 ENCOUNTER — Other Ambulatory Visit: Payer: Self-pay | Admitting: Psychiatry

## 2019-09-21 DIAGNOSIS — F411 Generalized anxiety disorder: Secondary | ICD-10-CM

## 2019-09-24 ENCOUNTER — Ambulatory Visit (INDEPENDENT_AMBULATORY_CARE_PROVIDER_SITE_OTHER): Payer: BC Managed Care – PPO | Admitting: Licensed Clinical Social Worker

## 2019-09-24 ENCOUNTER — Other Ambulatory Visit: Payer: Self-pay

## 2019-09-24 ENCOUNTER — Encounter: Payer: Self-pay | Admitting: Licensed Clinical Social Worker

## 2019-09-24 DIAGNOSIS — F411 Generalized anxiety disorder: Secondary | ICD-10-CM

## 2019-09-24 NOTE — Progress Notes (Signed)
Virtual Visit via Video Note  I connected with Jordan Leonard on 09/24/19 at 11:00 AM EST by a video enabled telemedicine application and verified that I am speaking with the correct person using two identifiers.   I discussed the limitations of evaluation and management by telemedicine and the availability of in person appointments. The patient expressed understanding and agreed to proceed.  I discussed the assessment and treatment plan with the patient. The patient was provided an opportunity to ask questions and all were answered. The patient agreed with the plan and demonstrated an understanding of the instructions.   The patient was advised to call back or seek an in-person evaluation if the symptoms worsen or if the condition fails to improve as anticipated.  I provided 60 minutes of non-face-to-face time during this encounter.   Alden Hipp, Jordan Leonard    THERAPIST PROGRESS NOTE  Session Time: 1100  Participation Level: Active  Behavioral Response: CasualAlertAnxious  Type of Therapy: Individual Therapy  Treatment Goals addressed: Coping  Interventions: Supportive  Summary: Jordan Leonard is a 28 y.o. female who presents with continued symptoms related to her diagnosis. Jordan Leonard reports doing well since our last session. She reports talking with her MD regarding side effects she was feeling due to her medication, and they made the decision together for her to stop taking it. Jordan Leonard reports she has not noticed any negative symptoms from coming off the medication, and is feeling more like herself recently. She reports her sex drive has gone back to normal, "and I don't feel like a zombie anymore, I'm having feelings." Jordan Leonard validated Jordan Leonard's experience, and highlighted how important it is to be able to advocate for yourself as Jordan Leonard was able to do. Jordan Leonard reports she has gotten very excited about having her baby; however, reports her parents have made comments about  her having another child that validated some of Jordan Leonard's fears. Jordan Leonard held space for Jordan Leonard to discuss her relationship with her parents, and how she could best approach this situation. Jordan Leonard encouraged Jordan Leonard to speak with her parents, while keeping appropriate boundaries, in order to facilitate change around the comments. Jordan Leonard reports feeling she has made progress in her relationship with her parents, and even allowed her child to stay the night for the first time with her parents. Jordan Leonard reports she wants things to continue moving in that direction. Jordan Leonard reassured Jordan Leonard that setting boundaries should not interfere with that relationship, nor should relaying feelings appropriately. Jordan Leonard expressed understanding and agreement. To wrap up, Jordan Leonard reviewed how to challenge negative thoughts in order to manage Jordan Leonard's personal anxiety around having another baby.   Suicidal/Homicidal: No  Therapist Response: Jordan Leonard continues to work towards her tx goals but has not yet reached them. We will continue to work on improving CBT skills moving forward.   Plan: Return again in 2 weeks.  Diagnosis: Axis I: Generalized Anxiety Disorder    Axis II: No diagnosis    Alden Hipp, Jordan Leonard 09/24/2019

## 2019-09-26 DIAGNOSIS — E109 Type 1 diabetes mellitus without complications: Secondary | ICD-10-CM | POA: Diagnosis not present

## 2019-09-26 DIAGNOSIS — Z794 Long term (current) use of insulin: Secondary | ICD-10-CM | POA: Diagnosis not present

## 2019-09-26 DIAGNOSIS — E108 Type 1 diabetes mellitus with unspecified complications: Secondary | ICD-10-CM | POA: Diagnosis not present

## 2019-10-02 ENCOUNTER — Other Ambulatory Visit: Payer: Self-pay

## 2019-10-02 ENCOUNTER — Encounter: Payer: Self-pay | Admitting: Obstetrics and Gynecology

## 2019-10-02 ENCOUNTER — Ambulatory Visit (INDEPENDENT_AMBULATORY_CARE_PROVIDER_SITE_OTHER): Payer: BC Managed Care – PPO | Admitting: Obstetrics and Gynecology

## 2019-10-02 ENCOUNTER — Other Ambulatory Visit (HOSPITAL_COMMUNITY)
Admission: RE | Admit: 2019-10-02 | Discharge: 2019-10-02 | Disposition: A | Discharge status: Home / Self Care | Payer: BC Managed Care – PPO | Source: Ambulatory Visit | Attending: Obstetrics and Gynecology | Admitting: Obstetrics and Gynecology

## 2019-10-02 VITALS — BP 117/82 | HR 97 | Wt 198.6 lb

## 2019-10-02 DIAGNOSIS — Z124 Encounter for screening for malignant neoplasm of cervix: Secondary | ICD-10-CM | POA: Insufficient documentation

## 2019-10-02 DIAGNOSIS — Z3491 Encounter for supervision of normal pregnancy, unspecified, first trimester: Secondary | ICD-10-CM

## 2019-10-02 DIAGNOSIS — Z3A12 12 weeks gestation of pregnancy: Secondary | ICD-10-CM

## 2019-10-02 LAB — POCT URINALYSIS DIPSTICK OB
Bilirubin, UA: NEGATIVE
Blood, UA: NEGATIVE
Glucose, UA: NEGATIVE
Ketones, UA: NEGATIVE
Leukocytes, UA: NEGATIVE
Nitrite, UA: NEGATIVE
POC,PROTEIN,UA: NEGATIVE
Spec Grav, UA: 1.01 (ref 1.010–1.025)
Urobilinogen, UA: 0.2 E.U./dL
pH, UA: 7 (ref 5.0–8.0)

## 2019-10-02 NOTE — Addendum Note (Signed)
Addended by: Durwin Glaze on: 10/02/2019 03:35 PM   Modules accepted: Orders

## 2019-10-02 NOTE — Progress Notes (Signed)
NOB: Pap performed, maternity 21 today, patient desires AFP next visit.  Patient confirms she desires a repeat CD.  Sugars excellent and hemoglobin A1c 6.6.  Will begin ASA 81 in 2 weeks.  Physical examination General NAD, Conversant  HEENT Atraumatic; Op clear with mmm.  Normo-cephalic. Pupils reactive. Anicteric sclerae  Thyroid/Neck Smooth without nodularity or enlargement. Normal ROM.  Neck Supple.  Skin No rashes, lesions or ulceration. Normal palpated skin turgor. No nodularity.  Breasts: No masses or discharge.  Symmetric.  No axillary adenopathy.  Lungs: Clear to auscultation.No rales or wheezes. Normal Respiratory effort, no retractions.  Heart: NSR.  No murmurs or rubs appreciated. No periferal edema  Abdomen: Soft.  Non-tender.  No masses.  No HSM. No hernia  Extremities: Moves all appropriately.  Normal ROM for age. No lymphadenopathy.  Neuro: Oriented to PPT.  Normal mood. Normal affect.     Pelvic:   Vulva: Normal appearance.  No lesions.  Vagina: No lesions or abnormalities noted.  Support: Normal pelvic support.  Urethra No masses tenderness or scarring.  Meatus Normal size without lesions or prolapse.  Cervix: Normal appearance.  No lesions.  Anus: Normal exam.  No lesions.  Perineum: Normal exam.  No lesions.        Bimanual   Adnexae: No masses.  Non-tender to palpation.  Uterus: Enlarged. 12 wks FHTs 164  Non-tender.  Mobile.  AV.  Adnexae: No masses.  Non-tender to palpation.  Cul-de-sac: Negative for abnormality.  Adnexae: No masses.  Non-tender to palpation.   The following were addressed during this visit:  Breastfeeding Education - Early initiation of breastfeeding    Comments: Keeps milk supply adequate, helps contract uterus and slow bleeding, and early milk is the perfect first food and is easy to digest.   - The importance of early skin-to-skin contact    Comments: Keeps baby warm and secure, helps keep baby's blood sugar up and breathing steady,  easier to bond and breastfeed, and helps calm baby.  - Rooming-in on a 24-hour basis    Comments: Easier to learn baby's feeding cues, easier to bond and get to know each other, and encourages milk production.

## 2019-10-08 ENCOUNTER — Other Ambulatory Visit: Payer: Self-pay

## 2019-10-08 ENCOUNTER — Ambulatory Visit (INDEPENDENT_AMBULATORY_CARE_PROVIDER_SITE_OTHER): Payer: BC Managed Care – PPO | Admitting: Psychiatry

## 2019-10-08 ENCOUNTER — Encounter: Payer: Self-pay | Admitting: Psychiatry

## 2019-10-08 DIAGNOSIS — F411 Generalized anxiety disorder: Secondary | ICD-10-CM

## 2019-10-08 DIAGNOSIS — F900 Attention-deficit hyperactivity disorder, predominantly inattentive type: Secondary | ICD-10-CM

## 2019-10-08 NOTE — Progress Notes (Signed)
Virtual Visit via Video Note  I connected with Jordan Leonard on 10/08/19 at  2:40 PM EST by a video enabled telemedicine application and verified that I am speaking with the correct person using two identifiers.   I discussed the limitations of evaluation and management by telemedicine and the availability of in person appointments. The patient expressed understanding and agreed to proceed.     I discussed the assessment and treatment plan with the patient. The patient was provided an opportunity to ask questions and all were answered. The patient agreed with the plan and demonstrated an understanding of the instructions.   The patient was advised to call back or seek an in-person evaluation if the symptoms worsen or if the condition fails to improve as anticipated.   Maybee MD OP Progress Note  10/08/2019 5:26 PM EVANGELENE VORA  MRN:  [id number]  Chief Complaint:  Chief Complaint    Follow-up     HPI: Jordan Leonard is a 29 year old Caucasian female, married, employed, lives in [place], has a history of anxiety, ADHD, diabetes melitis  was evaluated by telemedicine today.  Patient today reports she is currently not on the Lexapro anymore.  She reports she is tolerating it well so far.  She has not noticed any increased anxiety attacks or panic attacks since being off of it.  She reports she actually feels better without it.  She reports she feels as though she cannot feel her emotions again.  That is a good improvement.  She also does not have the anxiety or worrying about different things and about her pregnancy anymore.  She reports she does feel like she has nausea often which is due to the pregnancy.  However it is getting better.  She reports she continues to follow-up with her OB/GYN and everything seems to be going well.  She continues to work with her therapist and reports therapy sessions is beneficial.  She plans to keep up with her therapy sessions every 2  weeks.  Patient denies any suicidality, homicidality or perceptual disturbances.  Patient denies any other concerns today.   Visit Diagnosis:    ICD-10-CM   1. GAD (generalized anxiety disorder)  F41.1   2. Attention deficit hyperactivity disorder (ADHD), predominantly inattentive type  F90.0     Past Psychiatric History: I have reviewed past psychiatric history from my progress note on 06/12/2019.  Past trials of Strattera, Vyvanse, Klonopin.  Past Medical History:  Past Medical History:  Diagnosis Date  . ADHD   . Anxiety   . Diabetes mellitus type 1 ([place])   . History of cesarean section 09/01/2016  . History of chlamydia infection 09/01/2016   2007  . STD (sexually transmitted disease)    chlaymdia- 10 years ago    Past Surgical History:  Procedure Laterality Date  . CESAREAN SECTION      Family Psychiatric History: I have reviewed family psychiatric history from my progress note on 06/12/2019.  Family History:  Family History  Problem Relation Age of Onset  . Asthma Sister   . Multiple sclerosis Sister   . Anxiety disorder Sister   . Depression Sister   . Depression Mother   . Breast cancer Neg Hx   . Ovarian cancer Neg Hx   . Colon cancer Neg Hx   . Diabetes Neg Hx   . Heart disease Neg Hx   . Diabetes gravidarum Neg Hx   . Diabetes type I Neg Hx     Social History: I  have reviewed social history from my progress note on 06/12/2019. Social History   Socioeconomic History  . Marital status: Married    Spouse name: Not on file  . Number of children: 1  . Years of education: Not on file  . Highest education level: Not on file  Occupational History  . Not on file  Tobacco Use  . Smoking status: Never Smoker  . Smokeless tobacco: Never Used  Substance and Sexual Activity  . Alcohol use: Yes    Comment: rare  . Drug use: No  . Sexual activity: Yes    Birth control/protection: I.U.D.  Other Topics Concern  . Not on file  Social History Narrative    Married.    1 child.   Works at [employer].   Enjoys being with family, working on mind puzzles   Social Determinants of Corporate investment banker Strain:   . Difficulty of Paying Living Expenses: Not on file  Food Insecurity:   . Worried About Programme researcher, broadcasting/film/video in the Last Year: Not on file  . Ran Out of Food in the Last Year: Not on file  Transportation Needs:   . Lack of Transportation (Medical): Not on file  . Lack of Transportation (Non-Medical): Not on file  Physical Activity:   . Days of Exercise per Week: Not on file  . Minutes of Exercise per Session: Not on file  Stress:   . Feeling of Stress : Not on file  Social Connections:   . Frequency of Communication with Friends and Family: Not on file  . Frequency of Social Gatherings with Friends and Family: Not on file  . Attends Religious Services: Not on file  . Active Member of Clubs or Organizations: Not on file  . Attends Banker Meetings: Not on file  . Marital Status: Not on file    Allergies:  Allergies  Allergen Reactions  . Amoxicillin Anaphylaxis  . Penicillins Anaphylaxis    Metabolic Disorder Labs: Lab Results  Component Value Date   HGBA1C 6.6 (A) 08/26/2019   No results found for: PROLACTIN Lab Results  Component Value Date   CHOL 150 09/12/2018   TRIG 49 09/12/2018   HDL 57 09/12/2018   CHOLHDL 2.6 09/12/2018   LDLCALC 83 09/12/2018   LDLCALC 82 09/06/2017   Lab Results  Component Value Date   TSH 3.13 09/19/2019   TSH 2.640 09/12/2018    Therapeutic Level Labs: No results found for: LITHIUM No results found for: VALPROATE No components found for:  CBMZ  Current Medications: Current Outpatient Medications  Medication Sig Dispense Refill  . Doxylamine-Pyridoxine (DICLEGIS) 10-10 MG TBEC Take 2 tablets by mouth at bedtime. If symptoms persist, add one tablet in the morning and one in the afternoon 100 tablet 5  . glucagon (GLUCAGON EMERGENCY) 1 MG injection  1 mg as needed.     Marland Kitchen glucose blood (FREESTYLE PRECISION NEO TEST) test strip Use to monitor glucose levels 8 times per day; E11.9 100 each 12  . hydrOXYzine (ATARAX/VISTARIL) 25 MG tablet TAKE 0.5-1 TABLETS (12.5-25 MG TOTAL) BY MOUTH DAILY AS NEEDED. ONLY FOR SEVERE PANIC ATTACKS 90 tablet 0  . insulin aspart (NOVOLOG) 100 UNIT/ML injection USE VIA INSULIN PUMP. MAX OF 80 UNITS PER DAY. 90 mL 3  . Prenatal Vit-Fe Fumarate-FA (PRENATAL MULTIVITAMIN) TABS tablet Take 1 tablet by mouth daily at 12 noon.     No current facility-administered medications for this visit.     Musculoskeletal:  Strength & Muscle Tone: UTA Gait & Station: normal Patient leans: N/A  Psychiatric Specialty Exam: Review of Systems  Gastrointestinal: Positive for nausea.  Psychiatric/Behavioral: Negative for agitation, behavioral problems, confusion, decreased concentration, dysphoric mood, hallucinations, self-injury, sleep disturbance and suicidal ideas. The patient is not nervous/anxious and is not hyperactive.   All other systems reviewed and are negative.   Last menstrual period 07/08/2019, unknown if currently breastfeeding.There is no height or weight on file to calculate BMI.  General Appearance: Casual  Eye Contact:  Fair  Speech:  Clear and Coherent  Volume:  Normal  Mood:  Euthymic  Affect:  Congruent  Thought Process:  Goal Directed and Descriptions of Associations: Intact  Orientation:  Full (Time, Place, and Person)  Thought Content: Logical   Suicidal Thoughts:  No  Homicidal Thoughts:  No  Memory:  Immediate;   Fair Recent;   Fair Remote;   Fair  Judgement:  Fair  Insight:  Fair  Psychomotor Activity:  Normal  Concentration:  Concentration: Fair and Attention Span: Fair  Recall:  [employer] of Knowledge: Fair  Language: Fair  Akathisia:  No  Handed:  Right  AIMS (if indicated): Denies tremors, rigidity  Assets:  Communication Skills Desire for Improvement Housing Social Support   ADL's:  Intact  Cognition: WNL  Sleep:  Fair   Screenings: GAD-7     Office Visit from 06/12/2019 in [facility]  Total GAD-7 Score  15    PHQ2-9     Office Visit from 06/12/2019 in [facility] Nutrition [facility]/13/2018 [facility]  PHQ-2 Total Score  0  0       Assessment and Plan: Ginny is a 29 year old Caucasian female who is married, employed, lives in [place], has a history of GAD, ADHD, diabetes melitis, tachycardia was evaluated by telemedicine today.  She is biologically predisposed given her family history.  She also has psychosocial stressors of relationship struggles on the current pandemic.  Patient is currently doing well without her Lexapro and is motivated to continue psychotherapy sessions.  Plan as noted below.  Plan GAD-stable Continue CBT with Ms. Heidi Dach. She does have hydroxyzine available as needed for severe panic symptoms.  She is no longer on the Klonopin due to the pregnancy.  ADHD-inattentive type-chronic She is currently not on medications.  Follow-up in clinic as needed.    I have spent atleast 15 minutes non face to face with patient today. More than 50 % of the time was spent for  ordering medications and test ,psychoeducation and supportive psychotherapy and care coordination,as well as documenting clinical information in electronic health record.  This note was generated in part or whole with voice recognition software. Voice recognition is usually quite accurate but there are transcription errors that can and very often do occur. I apologize for any typographical errors that were not detected and corrected.        Jomarie Longs, MD 10/08/2019, 5:26 PM

## 2019-10-09 ENCOUNTER — Encounter: Payer: Self-pay | Admitting: Emergency Medicine

## 2019-10-09 ENCOUNTER — Other Ambulatory Visit: Payer: Self-pay

## 2019-10-09 ENCOUNTER — Emergency Department
Admission: EM | Admit: 2019-10-09 | Discharge: 2019-10-10 | Disposition: A | Discharge status: Home / Self Care | Payer: BLUE CROSS/BLUE SHIELD | Attending: Emergency Medicine | Admitting: Emergency Medicine

## 2019-10-09 DIAGNOSIS — O26891 Other specified pregnancy related conditions, first trimester: Secondary | ICD-10-CM | POA: Insufficient documentation

## 2019-10-09 DIAGNOSIS — O24011 Pre-existing diabetes mellitus, type 1, in pregnancy, first trimester: Secondary | ICD-10-CM | POA: Diagnosis not present

## 2019-10-09 DIAGNOSIS — G44201 Tension-type headache, unspecified, intractable: Secondary | ICD-10-CM | POA: Insufficient documentation

## 2019-10-09 DIAGNOSIS — E109 Type 1 diabetes mellitus without complications: Secondary | ICD-10-CM | POA: Insufficient documentation

## 2019-10-09 DIAGNOSIS — Z20822 Contact with and (suspected) exposure to covid-19: Secondary | ICD-10-CM | POA: Insufficient documentation

## 2019-10-09 DIAGNOSIS — O99351 Diseases of the nervous system complicating pregnancy, first trimester: Secondary | ICD-10-CM | POA: Diagnosis not present

## 2019-10-09 DIAGNOSIS — Z3A13 13 weeks gestation of pregnancy: Secondary | ICD-10-CM | POA: Insufficient documentation

## 2019-10-09 LAB — CYTOLOGY - PAP
Comment: NEGATIVE
Diagnosis: NEGATIVE
High risk HPV: NEGATIVE

## 2019-10-09 MED ORDER — DIPHENHYDRAMINE HCL 50 MG/ML IJ SOLN
INTRAMUSCULAR | Status: AC
  Filled 2019-10-09: qty 1

## 2019-10-09 MED ORDER — DIPHENHYDRAMINE HCL 50 MG/ML IJ SOLN
12.5000 mg | Freq: Once | INTRAMUSCULAR | Status: AC
  Administered 2019-10-09: 12.5 mg via INTRAVENOUS

## 2019-10-09 MED ORDER — ACETAMINOPHEN-CODEINE #3 300-30 MG PO TABS
2.0000 | ORAL_TABLET | Freq: Once | ORAL | Status: AC
  Administered 2019-10-09: 23:00:00 2 via ORAL
  Filled 2019-10-09: qty 2

## 2019-10-09 NOTE — ED Triage Notes (Signed)
Pt presents to ED with headache that started at 230 this morning that woke pt up from sleeping. Located at temples and across her forehead. [redacted] weeks pregnant. Pt was encouraged to come to ED by [facility]. tylenol taken with very little relief. pt reports hx of similar headaches after delivery of her last child and was told it was hormonal. Denies nausea or vomiting.

## 2019-10-09 NOTE — ED Provider Notes (Signed)
[facility] Emergency Department Provider Note ____________________________________________  Time seen: Approximately 11:38 PM  I have reviewed the triage vital signs and the nursing notes.   HISTORY  Chief Complaint Headache   HPI Jordan Leonard is a 29 y.o. female presents to the emergency department for treatment and evaluation of headache. She is type 1 diabetic and [redacted] weeks pregnant.  Location: frontal/temporal Similar to previous headaches: yes Duration: constant TIMING: 2:30 am SEVERITY: 6 QUALITY:throbbing CONTEXT: awakened from sleep with headache MODIFYING FACTORS: no relief with tylenol ASSOCIATED SYMPTOMS: no photophobia, blurred vision, nausea, weakness, confusion  Past Medical History:  Diagnosis Date  . ADHD   . Anxiety   . Diabetes mellitus type 1 ([facility])   . History of cesarean section 09/01/2016  . History of chlamydia infection 09/01/2016   2007  . STD (sexually transmitted disease)    chlaymdia- 10 years ago    Patient Active Problem List   Diagnosis Date Noted  . Panic attacks 08/19/2019  . Less than [redacted] weeks gestation of pregnancy 08/19/2019  . Attention deficit hyperactivity disorder (ADHD), predominantly inattentive type 07/10/2019  . GAD (generalized anxiety disorder) 03/05/2019  . Recurrent major depressive disorder ([facility]) 03/05/2019  . Toe pain, left 08/15/2018  . Breast cyst, right 03/07/2017  . Obesity (BMI 30.0-34.9) 09/01/2016  . Attention deficit hyperactivity disorder (ADHD), combined type 09/01/2016  . Presence of insulin pump 04/20/2015  . Type 1 diabetes mellitus ([facility]) 09/24/1999    Past Surgical History:  Procedure Laterality Date  . CESAREAN SECTION      Prior to Admission medications   Medication Sig Start Date End Date Taking? Authorizing Provider  Doxylamine-Pyridoxine (DICLEGIS) 10-10 MG TBEC Take 2 tablets by mouth at bedtime. If symptoms persist, add one tablet in the morning and one in the  afternoon 09/04/19   Linzie Collin, MD  glucagon (GLUCAGON EMERGENCY) 1 MG injection 1 mg as needed.  06/21/05   [provider]  glucose blood (FREESTYLE PRECISION NEO TEST) test strip Use to monitor glucose levels 8 times per day; E11.9 02/18/19   Romero Belling, MD  hydrOXYzine (ATARAX/VISTARIL) 25 MG tablet TAKE 0.5-1 TABLETS (12.5-25 MG TOTAL) BY MOUTH DAILY AS NEEDED. ONLY FOR SEVERE PANIC ATTACKS 09/23/19   Jomarie Longs, MD  insulin aspart (NOVOLOG) 100 UNIT/ML injection USE VIA INSULIN PUMP. MAX OF 80 UNITS PER DAY. 08/06/19   Romero Belling, MD  Prenatal Vit-Fe Fumarate-FA (PRENATAL MULTIVITAMIN) TABS tablet Take 1 tablet by mouth daily at 12 noon.    [provider]    Allergies Amoxicillin and Penicillins  Family History  Problem Relation Age of Onset  . Asthma Sister   . Multiple sclerosis Sister   . Anxiety disorder Sister   . Depression Sister   . Depression Mother   . Breast cancer Neg Hx   . Ovarian cancer Neg Hx   . Colon cancer Neg Hx   . Diabetes Neg Hx   . Heart disease Neg Hx   . Diabetes gravidarum Neg Hx   . Diabetes type I Neg Hx     Social History Social History   Tobacco Use  . Smoking status: Never Smoker  . Smokeless tobacco: Never Used  Substance Use Topics  . Alcohol use: Yes    Comment: rare  . Drug use: No    Review of Systems Constitutional: No fever/chills or recent injury. Eyes: No visual changes. ENT: No sore throat. Respiratory: Denies shortness of breath. Gastrointestinal: No abdominal pain.  No nausea, no vomiting.  No diarrhea.  No constipation. Musculoskeletal: Negative for pain. Skin: Negative for rash. Neurological:Positive for headache, negative for focal weakness or numbness. No confusion or fainting. ___________________________________________   PHYSICAL EXAM:  VITAL SIGNS: ED Triage Vitals  Enc Vitals Group     BP 10/09/19 2055 (!) 141/80     Pulse Rate 10/09/19 2055 (!) 101     Resp 10/09/19  2055 18     Temp 10/09/19 2055 100.1 F (37.8 C)     Temp src --      SpO2 10/09/19 2055 100 %     Weight 10/09/19 2045 198 lb (89.8 kg)     Height 10/09/19 2045 5' (1.524 m)     Head Circumference --      Peak Flow --      Pain Score 10/09/19 2045 8     Pain Loc --      Pain Edu? --      Excl. in Circle? --     Constitutional: Alert and oriented. Well appearing and in no acute distress. Eyes: Conjunctivae are normal. PERRL. EOMI without expressed pain. No evidence of papilledema on limited exam. Head: Atraumatic. Nose: No congestion/rhinnorhea. Mouth/Throat: Mucous membranes are moist.  Oropharynx non-erythematous. Neck: No stridor. Supple, no meningismus.  Cardiovascular: Normal rate, regular rhythm. Grossly normal heart sounds.  Good peripheral circulation. Respiratory: Normal respiratory effort.  No retractions. Lungs CTAB. Gastrointestinal: Soft and nontender. No distention.  Musculoskeletal: No lower extremity tenderness nor edema.  No joint effusions. Neurologic:  Normal speech and language. No gross focal neurologic deficits are appreciated. No gait instability. Cranial nerves: 2-10 normal as tested. Cerebellar:Normal Romberg, finger-nose-finger, heel to shin, normal gait. Sensorimotor: No aphasia, pronator drift, clonus, sensory loss or abnormal reflexes.  Skin:  Skin is warm, dry and intact. No rash noted. Psychiatric: Mood and affect are normal. Speech and behavior are normal. Normal thought process and cognition.  ____________________________________________   LABS (all labs ordered are listed, but only abnormal results are displayed)  Labs Reviewed  SARS CORONAVIRUS 2 (TAT 6-24 HRS)   ____________________________________________  EKG  Not indicated. ____________________________________________  RADIOLOGY  No results found. ____________________________________________   PROCEDURES  Procedure(s) performed:  Procedures  Critical Care performed:  none ____________________________________________   INITIAL IMPRESSION / ASSESSMENT AND PLAN / ED COURSE  29 year old female presenting to the emergency department for treatment of headache.  She has tried to take Tylenol, hot shower, cold compress, nap without any relief.  She had had similar headaches in the past.  After delivery of her last child, she had the headache and was told that it was related to hormone fluctuation.  Last dose of Tylenol was this morning around 10 AM.  Will attempt to treat headache with Tylenol 3. Also of note, temperature in triage was 100.1. She denies known exposure to COVID-19 or influenza. She is working from home.  ----------------------------------------- 11:52 PM on 10/09/2019 -----------------------------------------  Patient called out due to feeling a "warm rush" feeling over her body and feeling that her heart rate went up. She is also feeling some anxiety. She states that she has a history of "anxiety hives" and takes hydroxyzine and is experiencing some itching. Not sure if itching is from anxiety or allergy to Tylenol #3.   Heart rate is now 119 with oxygen saturation of 99%. Voice is clear. No airway edema. IV and benadryl ordered. Will move her to [place] for monitoring.  ____________________________________________   FINAL CLINICAL IMPRESSION(S) /  ED DIAGNOSES  Final diagnoses:  Acute intractable tension-type headache    ED Discharge Orders    None        Chinita Pester, FNP 10/10/19 0002    Don Perking, [place], MD 10/10/19 [phone]

## 2019-10-09 NOTE — ED Notes (Signed)
Pt given saltine crackers. 

## 2019-10-10 ENCOUNTER — Ambulatory Visit (INDEPENDENT_AMBULATORY_CARE_PROVIDER_SITE_OTHER): Payer: BC Managed Care – PPO | Admitting: Licensed Clinical Social Worker

## 2019-10-10 ENCOUNTER — Telehealth: Payer: Self-pay | Admitting: Obstetrics and Gynecology

## 2019-10-10 ENCOUNTER — Other Ambulatory Visit: Payer: Self-pay

## 2019-10-10 ENCOUNTER — Other Ambulatory Visit: Payer: Self-pay | Admitting: Obstetrics and Gynecology

## 2019-10-10 ENCOUNTER — Encounter: Payer: Self-pay | Admitting: Licensed Clinical Social Worker

## 2019-10-10 DIAGNOSIS — F411 Generalized anxiety disorder: Secondary | ICD-10-CM

## 2019-10-10 DIAGNOSIS — R519 Headache, unspecified: Secondary | ICD-10-CM

## 2019-10-10 LAB — URINALYSIS, COMPLETE (UACMP) WITH MICROSCOPIC
Bacteria, UA: NONE SEEN
Bilirubin Urine: NEGATIVE
Glucose, UA: NEGATIVE mg/dL
Hgb urine dipstick: NEGATIVE
Ketones, ur: 80 mg/dL — AB
Leukocytes,Ua: NEGATIVE
Nitrite: NEGATIVE
Protein, ur: NEGATIVE mg/dL
Specific Gravity, Urine: 1.024 (ref 1.005–1.030)
pH: 6 (ref 5.0–8.0)

## 2019-10-10 LAB — CBC WITH DIFFERENTIAL/PLATELET
Abs Immature Granulocytes: 0.04 10*3/uL (ref 0.00–0.07)
Basophils Absolute: 0 10*3/uL (ref 0.0–0.1)
Basophils Relative: 0 %
Eosinophils Absolute: 0.1 10*3/uL (ref 0.0–0.5)
Eosinophils Relative: 0 %
HCT: 40.1 % (ref 36.0–46.0)
Hemoglobin: 13.7 g/dL (ref 12.0–15.0)
Immature Granulocytes: 0 %
Lymphocytes Relative: 17 %
Lymphs Abs: 2.1 10*3/uL (ref 0.7–4.0)
MCH: 28.9 pg (ref 26.0–34.0)
MCHC: 34.2 g/dL (ref 30.0–36.0)
MCV: 84.6 fL (ref 80.0–100.0)
Monocytes Absolute: 0.7 10*3/uL (ref 0.1–1.0)
Monocytes Relative: 6 %
Neutro Abs: 9.4 10*3/uL — ABNORMAL HIGH (ref 1.7–7.7)
Neutrophils Relative %: 77 %
Platelets: 320 10*3/uL (ref 150–400)
RBC: 4.74 MIL/uL (ref 3.87–5.11)
RDW: 12.6 % (ref 11.5–15.5)
WBC: 12.3 10*3/uL — ABNORMAL HIGH (ref 4.0–10.5)
nRBC: 0 % (ref 0.0–0.2)

## 2019-10-10 LAB — GLUCOSE, CAPILLARY
Glucose-Capillary: 105 mg/dL — ABNORMAL HIGH (ref 70–99)
Glucose-Capillary: 141 mg/dL — ABNORMAL HIGH (ref 70–99)
Glucose-Capillary: 51 mg/dL — ABNORMAL LOW (ref 70–99)
Glucose-Capillary: 71 mg/dL (ref 70–99)

## 2019-10-10 LAB — COMPREHENSIVE METABOLIC PANEL
ALT: 16 U/L (ref 0–44)
AST: 17 U/L (ref 15–41)
Albumin: 4 g/dL (ref 3.5–5.0)
Alkaline Phosphatase: 43 U/L (ref 38–126)
Anion gap: 10 (ref 5–15)
BUN: 12 mg/dL (ref 6–20)
CO2: 22 mmol/L (ref 22–32)
Calcium: 9.1 mg/dL (ref 8.9–10.3)
Chloride: 105 mmol/L (ref 98–111)
Creatinine, Ser: 0.63 mg/dL (ref 0.44–1.00)
GFR calc Af Amer: >60 mL/min (ref >60)
GFR calc non Af Amer: >60 mL/min (ref >60)
Glucose, Bld: 90 mg/dL (ref 70–99)
Potassium: 3.5 mmol/L (ref 3.5–5.1)
Sodium: 137 mmol/L (ref 135–145)
Total Bilirubin: 0.5 mg/dL (ref 0.3–1.2)
Total Protein: 7.4 g/dL (ref 6.5–8.1)

## 2019-10-10 LAB — SARS CORONAVIRUS 2 (TAT 6-24 HRS): SARS Coronavirus 2: NEGATIVE

## 2019-10-10 LAB — PROTEIN / CREATININE RATIO, URINE
Creatinine, Urine: 182 mg/dL
Protein Creatinine Ratio: 0.05 mg/mg{Cre} (ref 0.00–0.15)
Total Protein, Urine: 10 mg/dL

## 2019-10-10 MED ORDER — MAGNESIUM SULFATE 2 GM/50ML IV SOLN
2.0000 g | Freq: Once | INTRAVENOUS | Status: AC
  Administered 2019-10-10: 02:00:00 2 g via INTRAVENOUS
  Filled 2019-10-10: qty 50

## 2019-10-10 MED ORDER — BUTALBITAL-APAP-CAFFEINE 50-325-40 MG PO CAPS: 1.0000 | ORAL_CAPSULE | Freq: Four times a day (QID) | ORAL | 0 refills | Status: DC | PRN

## 2019-10-10 MED ORDER — SODIUM CHLORIDE 0.9 % IV BOLUS
1000.0000 mL | Freq: Once | INTRAVENOUS | Status: AC
  Administered 2019-10-10: 1000 mL via INTRAVENOUS

## 2019-10-10 NOTE — ED Notes (Signed)
MD notified of FSBS; pt reports feeling better now after drinking OJ

## 2019-10-10 NOTE — ED Notes (Addendum)
Pt requests FSBS as she feels her sugar is dropping; FSBS 51; pt given 8oz OJ to drink and MD notified

## 2019-10-10 NOTE — Progress Notes (Signed)
fior

## 2019-10-10 NOTE — ED Notes (Signed)
Dr Don Perking notified of FSBS; pt with no c/o; MD st ready for d/c

## 2019-10-10 NOTE — ED Notes (Signed)
This RN to bedside. Pt c/o of heart racing and feeling a rush of warmth sensation over her body. NP at bedside. Pt placed on dinamap. HR 115, BP 156/86. O2 95% on RA. IV started and pt given 12.5mg  of benadryl per NP request. Pt A&Ox4.

## 2019-10-10 NOTE — Telephone Encounter (Signed)
Patient has been having bad headaches that last about 24 hours. She went to ED to be seen for this. She is asking for something to help with headaches. Dr. Logan Bores has sent in fiorect to the pharmacy.

## 2019-10-10 NOTE — ED Notes (Addendum)
Dr Don Perking at bedside. MD gave pt 4oz OJ and crackers and will recheck FSBS in approx 

## 2019-10-10 NOTE — Discharge Instructions (Addendum)
You have been seen in the Emergency Department (ED) for a headache. Your evaluation today was overall reassuring. Headaches have many possible causes. Most headaches aren't a sign of a more serious problem, and they will get better on their own.   Follow-up with your doctor in 12-24 hours if you are still having a headache. Otherwise follow up with your doctor in 3-5 days.  For pain take tylenol 1000mg  every 6-8 hours, not more than 4000mg  in 24 hour period  When should you call for help?  Call 911 or return to the ED anytime you think you may need emergency care. For example, call if:  You have signs of a stroke. These may include:  Sudden numbness, paralysis, or weakness in your face, arm, or leg, especially on only one side of your body.  Sudden vision changes.  Sudden trouble speaking.  Sudden confusion or trouble understanding simple statements.  Sudden problems with walking or balance.  A sudden, severe headache that is different from past headaches. You have new or worsening headache Nausea and vomiting associated with your headache Fever, neck stiffness associated with your headache  Call your doctor now or seek immediate medical care if:  You have a new or worse headache.  Your headache gets much worse.  How can you care for yourself at home?  Do not drive if you have taken a prescription pain medicine.  Rest in a quiet, dark room until your headache is gone. Close your eyes and try to relax or go to sleep. Don't watch TV or read.  Put a cold, moist cloth or cold pack on the painful area for 10 to 20 minutes at a time. Put a thin cloth between the cold pack and your skin.  Use a warm, moist towel or a heating pad set on low to relax tight shoulder and neck muscles.  Have someone gently massage your neck and shoulders.  Take pain medicines exactly as directed.  If the doctor gave you a prescription medicine for pain, take it as prescribed.  If you are not taking a prescription  pain medicine, ask your doctor if you can take an over-the-counter medicine. Be careful not to take pain medicine more often than the instructions allow, because you may get worse or more frequent headaches when the medicine wears off.  Do not ignore new symptoms that occur with a headache, such as a fever, weakness or numbness, vision changes, or confusion. These may be signs of a more serious problem.  To prevent headaches  Keep a headache diary so you can figure out what triggers your headaches. Avoiding triggers may help you prevent headaches. Record when each headache began, how long it lasted, and what the pain was like (throbbing, aching, stabbing, or dull). Write down any other symptoms you had with the headache, such as nausea, flashing lights or dark spots, or sensitivity to bright light or loud noise. Note if the headache occurred near your period. List anything that might have triggered the headache, such as certain foods (chocolate, cheese, wine) or odors, smoke, bright light, stress, or lack of sleep.  Find healthy ways to deal with stress. Headaches are most common during or right after stressful times. Take time to relax before and after you do something that has caused a headache in the past.  Try to keep your muscles relaxed by keeping good posture. Check your jaw, face, neck, and shoulder muscles for tension, and try relaxing them. When sitting at a desk,  change positions often, and stretch for 30 seconds each hour.  Get plenty of sleep and exercise.  Eat regularly and well. Long periods without food can trigger a headache.  Treat yourself to a massage. Some people find that regular massages are very helpful in relieving tension.  Limit caffeine by not drinking too much coffee, tea, or soda. But don't quit caffeine suddenly, because that can also give you headaches.  Reduce eyestrain from computers by blinking frequently and looking away from the computer screen every so often. Make  sure you have proper eyewear and that your monitor is set up properly, about an arm's length away.  Seek help if you have depression or anxiety. Your headaches may be linked to these conditions. Treatment can both prevent headaches and help with symptoms of anxiety or depression.

## 2019-10-10 NOTE — Telephone Encounter (Signed)
Pt went to ED last night and wants to talk to a nurse. Pt has some questions and is requesting a call back. Please advise

## 2019-10-10 NOTE — ED Notes (Addendum)
Pt uprite on stretcher in hallway with no distress noted; pt denies any c/o at present

## 2019-10-11 ENCOUNTER — Other Ambulatory Visit: Payer: Self-pay

## 2019-10-11 ENCOUNTER — Encounter: Payer: Self-pay | Admitting: Endocrinology

## 2019-10-11 ENCOUNTER — Ambulatory Visit (INDEPENDENT_AMBULATORY_CARE_PROVIDER_SITE_OTHER): Payer: BLUE CROSS/BLUE SHIELD | Admitting: Endocrinology

## 2019-10-11 DIAGNOSIS — E109 Type 1 diabetes mellitus without complications: Secondary | ICD-10-CM

## 2019-10-11 NOTE — Progress Notes (Addendum)
Subjective:    Patient ID: Jordan Leonard, female    DOB: 17-Oct-1990, 29 y.o.   MRN: [id number]  HPI  telehealth visit today via doxy video visit.  Alternatives to telehealth are presented to this patient, and the patient agrees to the telehealth visit. Pt is advised of the cost of the visit, and agrees to this, also.   Patient is at home, and I am at the office.   Persons attending the telehealth visit: the patient and I Pt returns for f/u of diabetes mellitus:  DM type: 1 Dx'ed: 2000 Complications: none Therapy: insulin since dx, and pump rx since 2005 (Tandem pump and Dexcom G6 continuous glucose monitor since 2020).   GDM: never.  DKA: only once, at dx Severe hypoglycemia: never.  Pancreatitis: never Pancreatic imaging: never.  Other: she eats meals at 8 AM, noon, and 7 PM; she works from home now.   Interval history:  She takes these pump settings: basal rate of 1.5 units/hr, except 1.8 units/HR, 3 AM-6 AM.  mealtime bolus of 1 unit/3.3 grams carbohydrate at all times of day.   correction bolus (which some people call "sensitivity," or "insulin sensitivity ratio," or just "isr") of 1 unit for each 40 by which your glucose exceeds 110.  continuous glucose monitor is downloaded today, and the printout is scanned into the record.  glucose varies from 40-229.  There is no trend throughout the day.  Pt glucose averages 113.   TDD is 53 units (bolus=basal) Past Medical History:  Diagnosis Date  . ADHD   . Anxiety   . Diabetes mellitus type 1 (HCC)   . History of cesarean section 09/01/2016  . History of chlamydia infection 09/01/2016   2007  . STD (sexually transmitted disease)    chlaymdia- 10 years ago    Past Surgical History:  Procedure Laterality Date  . CESAREAN SECTION      Social History   Socioeconomic History  . Marital status: Married    Spouse name: Not on file  . Number of children: 1  . Years of education: Not on file  . Highest education level:  Not on file  Occupational History  . Not on file  Tobacco Use  . Smoking status: Never Smoker  . Smokeless tobacco: Never Used  Substance and Sexual Activity  . Alcohol use: Yes    Comment: rare  . Drug use: No  . Sexual activity: Yes  Other Topics Concern  . Not on file  Social History Narrative   Married.    1 child.   Works at [employer].   Enjoys being with family, working on mind puzzles   Social Determinants of Corporate investment banker Strain:   . Difficulty of Paying Living Expenses: Not on file  Food Insecurity:   . Worried About Programme researcher, broadcasting/film/video in the Last Year: Not on file  . Ran Out of Food in the Last Year: Not on file  Transportation Needs:   . Lack of Transportation (Medical): Not on file  . Lack of Transportation (Non-Medical): Not on file  Physical Activity:   . Days of Exercise per Week: Not on file  . Minutes of Exercise per Session: Not on file  Stress:   . Feeling of Stress : Not on file  Social Connections:   . Frequency of Communication with Friends and Family: Not on file  . Frequency of Social Gatherings with Friends and Family: Not on file  . Attends  Religious Services: Not on file  . Active Member of Clubs or Organizations: Not on file  . Attends Archivist Meetings: Not on file  . Marital Status: Not on file  Intimate Partner Violence:   . Fear of Current or Ex-Partner: Not on file  . Emotionally Abused: Not on file  . Physically Abused: Not on file  . Sexually Abused: Not on file    Current Outpatient Medications on File Prior to Visit  Medication Sig Dispense Refill  . Butalbital-APAP-Caffeine 50-325-40 MG capsule Take 1-2 capsules by mouth every 6 (six) hours as needed for headache. 3 capsule 0  . Doxylamine-Pyridoxine (DICLEGIS) 10-10 MG TBEC Take 2 tablets by mouth at bedtime. If symptoms persist, add one tablet in the morning and one in the afternoon 100 tablet 5  . glucagon (GLUCAGON EMERGENCY) 1 MG  injection 1 mg as needed.     Marland Kitchen glucose blood (FREESTYLE PRECISION NEO TEST) test strip Use to monitor glucose levels 8 times per day; E11.9 100 each 12  . hydrOXYzine (ATARAX/VISTARIL) 25 MG tablet TAKE 0.5-1 TABLETS (12.5-25 MG TOTAL) BY MOUTH DAILY AS NEEDED. ONLY FOR SEVERE PANIC ATTACKS 90 tablet 0  . insulin aspart (NOVOLOG) 100 UNIT/ML injection USE VIA INSULIN PUMP. MAX OF 80 UNITS PER DAY. 90 mL 3  . Prenatal Vit-Fe Fumarate-FA (PRENATAL MULTIVITAMIN) TABS tablet Take 1 tablet by mouth daily at 12 noon.     No current facility-administered medications on file prior to visit.    Allergies  Allergen Reactions  . Amoxicillin Anaphylaxis  . Penicillins Anaphylaxis    Family History  Problem Relation Age of Onset  . Asthma Sister   . Multiple sclerosis Sister   . Anxiety disorder Sister   . Depression Sister   . Depression Mother   . Breast cancer Neg Hx   . Ovarian cancer Neg Hx   . Colon cancer Neg Hx   . Diabetes Neg Hx   . Heart disease Neg Hx   . Diabetes gravidarum Neg Hx   . Diabetes type I Neg Hx     LMP 07/08/2019   Review of Systems Denies LOC.   She has only gained 3 lbs so far.      Objective:   Physical Exam      Assessment & Plan:  Type 1 DM: well-controlled Pregnancy: in this setting, she needs aggressive glycemic control Hypoglycemia: this limits aggressiveness of glycemic control  Patient Instructions  Please take these settings: basal rate of 1.5 units/hr, except 1.8 units/HR, 3 AM-6 AM.  mealtime bolus of 1 unit/3.3 grams carbohydrate at all times of day.   correction bolus (which some people call "sensitivity," or "insulin sensitivity ratio," or just "isr") of 1 unit for each 40 by which your glucose exceeds 110.  Please come back for a follow-up appointment in 2-3 weeks, in person if possible.

## 2019-10-11 NOTE — Progress Notes (Signed)
Virtual Visit via Telephone Note  I connected with Jordan Leonard on 10/11/19 at  3:30 PM EST by telephone and verified that I am speaking with the correct person using two identifiers.   I discussed the limitations, risks, security and privacy concerns of performing an evaluation and management service by telephone and the availability of in person appointments. I also discussed with the patient that there may be a patient responsible charge related to this service. The patient expressed understanding and agreed to proceed.  I discussed the assessment and treatment plan with the patient. The patient was provided an opportunity to ask questions and all were answered. The patient agreed with the plan and demonstrated an understanding of the instructions.   The patient was advised to call back or seek an in-person evaluation if the symptoms worsen or if the condition fails to improve as anticipated.  I provided 60 minutes of non-face-to-face time during this encounter.   Heidi Dach, LCSW    THERAPIST PROGRESS NOTE  Session Time: 1500  Participation Level: Active  Behavioral Response: NeatAlertAnxious  Type of Therapy: Individual Therapy  Treatment Goals addressed: Coping  Interventions: Supportive  Summary: Jordan Leonard is a 29 y.o. female who presents with continued symptoms related to her diagnosis. Jordan Leonard reports doing well since our last session. She reports she has had a couple moments of anxiety but has been able to manage her symptoms in the moment. Jordan Leonard reports she had to go to the ER due to a hormonal headache that would not subside, and her doctor instructed her to go to the ER. Jordan Leonard reports once she was there, she ended up being there for most of the evening due to complications related to her blood sugar. She reports she broke out in hives due to her anxiety. LCSW validated Jordan Leonard's feelings, and highlighted this was a rational/normal response to the  situation she was in. LCSW pointed out well Jordan Leonard managed her anxiety in the moment and was able to handle the situation. Jordan Leonard was able to recognize this progress as well. Jordan Leonard reported another moment, during which she and her husband were being intimate, that she was able to challenge an anxious thought by asking herself what the facts were behind the thought. Jordan Leonard reported feeling proud of herself that she was able to utilize that skill in the moment. We discussed ways Jordan Leonard can continue to utilize these skills in the moment, and discussed additional ways she can utilize evidence to challenge thoughts.  Suicidal/Homicidal: No  Therapist Response: Jordan Leonard continues to work towards her tx goals but has not yet reached them. We will continue to work on improving CBT skills and distress tolerance skills moving forward.   Plan: Return again in 2 weeks.  Diagnosis: Axis I: Generalized Anxiety Disorder    Axis II: No diagnosis    Heidi Dach, LCSW 10/11/2019

## 2019-10-11 NOTE — Patient Instructions (Addendum)
Please take these settings: basal rate of 1.5 units/hr, except 1.8 units/HR, 3 AM-6 AM.  mealtime bolus of 1 unit/3.3 grams carbohydrate at all times of day.   correction bolus (which some people call "sensitivity," or "insulin sensitivity ratio," or just "isr") of 1 unit for each 40 by which your glucose exceeds 110.  Please come back for a follow-up appointment in 2-3 weeks, in person if possible.

## 2019-10-14 LAB — MATERNIT21  PLUS CORE+ESS+SCA, BLOOD

## 2019-10-23 ENCOUNTER — Ambulatory Visit: Payer: BLUE CROSS/BLUE SHIELD | Attending: Internal Medicine

## 2019-10-23 DIAGNOSIS — Z20822 Contact with and (suspected) exposure to covid-19: Secondary | ICD-10-CM

## 2019-10-24 ENCOUNTER — Encounter: Payer: Self-pay | Admitting: Licensed Clinical Social Worker

## 2019-10-24 ENCOUNTER — Ambulatory Visit (INDEPENDENT_AMBULATORY_CARE_PROVIDER_SITE_OTHER): Payer: BC Managed Care – PPO | Admitting: Licensed Clinical Social Worker

## 2019-10-24 ENCOUNTER — Other Ambulatory Visit: Payer: Self-pay

## 2019-10-24 DIAGNOSIS — F411 Generalized anxiety disorder: Secondary | ICD-10-CM

## 2019-10-24 LAB — NOVEL CORONAVIRUS, NAA: SARS-CoV-2, NAA: DETECTED — AB

## 2019-10-24 NOTE — Progress Notes (Signed)
Virtual Visit via Telephone Note  I connected with Jordan Leonard on 10/24/19 at  9:00 AM EST by telephone and verified that I am speaking with the correct person using two identifiers.   I discussed the limitations, risks, security and privacy concerns of performing an evaluation and management service by telephone and the availability of in person appointments. I also discussed with the patient that there may be a patient responsible charge related to this service. The patient expressed understanding and agreed to proceed.  I discussed the assessment and treatment plan with the patient. The patient was provided an opportunity to ask questions and all were answered. The patient agreed with the plan and demonstrated an understanding of the instructions.   The patient was advised to call back or seek an in-person evaluation if the symptoms worsen or if the condition fails to improve as anticipated.  I provided 55 minutes of non-face-to-face time during this encounter.   Heidi Dach, Jordan Leonard    THERAPIST PROGRESS NOTE  Session Time: 0900  Participation Level: Active  Behavioral Response: CasualAlertAnxious  Type of Therapy: Individual Therapy  Treatment Goals addressed: Coping  Interventions: CBT  Summary: Jordan Leonard is a 29 y.o. female who presents with continued symptoms related to her diagnosis. Jordan Leonard reports doing well since our last session, but noted she has been sick and was recently tested for COVID. Jordan Leonard reports feeling extremely worried she contracted COVID, as she has lost her sense of taste and is very congested. She reports that she went to get tested, and has been waiting for her results. Jordan Leonard validated Jordan Leonard feelings around this situation, and encouraged her to Leonard CBT skills to manage her anxiety around the situation. We discussed several pieces of evidence Jordan Leonard to challenge her anxious Leonard in the moment, and to ensure  she is able to control her anxiety. Jordan Leonard reported feeling shame and worries others will judge her if she has contracted COVID. Jordan Leonard validated these feelings and held space for Jordan Leonard to vent her feelings related to this situation. Jordan Leonard encouraged [name] to Leonard thought challenging techniques in this situation as well. [name] understanding and agreement.  Suicidal/Homicidal: No  Therapist Response: Jordan Leonard continues to work towards her tx goals but has not yet reached them. We will continue to work on improving emotional regulation skills and distress tolerance skills moving forward. We will also continue to work to improve CBT skills to manage anxiety symptoms in the moment.   Plan: Return again in 2 weeks.  Diagnosis: Axis I: Generalized Anxiety Disorder    Axis II: No diagnosis    Heidi Dach, Jordan Leonard 10/24/2019

## 2019-10-29 ENCOUNTER — Encounter: Payer: Self-pay | Admitting: Endocrinology

## 2019-10-29 ENCOUNTER — Ambulatory Visit (INDEPENDENT_AMBULATORY_CARE_PROVIDER_SITE_OTHER): Payer: BLUE CROSS/BLUE SHIELD | Admitting: Endocrinology

## 2019-10-29 ENCOUNTER — Other Ambulatory Visit: Payer: Self-pay

## 2019-10-29 DIAGNOSIS — E109 Type 1 diabetes mellitus without complications: Secondary | ICD-10-CM

## 2019-10-29 NOTE — Patient Instructions (Addendum)
Please take these settings: basal rate of 1.4 units/hr, except 1.7 units/HR, 3 AM-6 AM.  mealtime bolus of 1 unit/3.3 grams carbohydrate (except please add 2 units to lunch bolus).    correction bolus (which some people call "sensitivity," or "insulin sensitivity ratio," or just "isr") of 1 unit for each 40 by which your glucose exceeds 110.  (I messaged these numbers to pt) Please come back for a follow-up appointment in 3 weeks, in person if possible.

## 2019-10-29 NOTE — Progress Notes (Signed)
Subjective:    Patient ID: Jordan Leonard, female    DOB: 06-Dec-1990, 29 y.o.   MRN: [id number]  HPI  telehealth visit today via doxy video visit.  Alternatives to telehealth are presented to this patient, and the patient agrees to the telehealth visit. Pt is advised of the cost of the visit, and agrees to this, also.   Patient is at home, and I am at the office.   Persons attending the telehealth visit: the patient and I.  Pt returns for f/u of diabetes mellitus:  DM type: 1 Dx'ed: 2000 Complications: none Therapy: insulin since dx, and pump rx since 2005 (Tandem pump and Dexcom G6 continuous glucose monitor since 2020).   GDM: never.  DKA: only once, at dx Severe hypoglycemia: never.  Pancreatitis: never Pancreatic imaging: never.  Other: she eats meals at 8 AM, noon, and 7 PM; she works from home now.   Interval history:  She takes these pump settings: basal rate of 1.5 units/hr, except 1.8 units/HR, 3 AM-6 AM.  mealtime bolus of 1 unit/3.3 grams carbohydrate at all times of day.   correction bolus (which some people call "sensitivity," or "insulin sensitivity ratio," or just "isr") of 1 unit for each 40 by which your glucose exceeds 110.  She is [redacted] weeks pregnant.  continuous glucose monitor is downloaded today, and the printout is scanned into the record.  glucose varies from 40-240.  There is no trend throughout the day, except it is highest after lunch.  Pt glucose averages 117. TDD is 47 units (bolus=basal) She tested pos for coronavirus last week.  Main symptom is cough.  Past Medical History:  Diagnosis Date  . ADHD   . Anxiety   . Diabetes mellitus type 1 (HCC)   . History of cesarean section 09/01/2016  . History of chlamydia infection 09/01/2016   2007  . STD (sexually transmitted disease)    chlaymdia- 10 years ago    Past Surgical History:  Procedure Laterality Date  . CESAREAN SECTION      Social History   Socioeconomic History  . Marital  status: Married    Spouse name: Not on file  . Number of children: 1  . Years of education: Not on file  . Highest education level: Not on file  Occupational History  . Not on file  Tobacco Use  . Smoking status: Never Smoker  . Smokeless tobacco: Never Used  Substance and Sexual Activity  . Alcohol use: Yes    Comment: rare  . Drug use: No  . Sexual activity: Yes  Other Topics Concern  . Not on file  Social History Narrative   Married.    1 child.   Works at [employer].   Enjoys being with family, working on mind puzzles   Social Determinants of Corporate investment banker Strain:   . Difficulty of Paying Living Expenses: Not on file  Food Insecurity:   . Worried About Programme researcher, broadcasting/film/video in the Last Year: Not on file  . Ran Out of Food in the Last Year: Not on file  Transportation Needs:   . Lack of Transportation (Medical): Not on file  . Lack of Transportation (Non-Medical): Not on file  Physical Activity:   . Days of Exercise per Week: Not on file  . Minutes of Exercise per Session: Not on file  Stress:   . Feeling of Stress : Not on file  Social Connections:   . Frequency of  Communication with Friends and Family: Not on file  . Frequency of Social Gatherings with Friends and Family: Not on file  . Attends Religious Services: Not on file  . Active Member of Clubs or Organizations: Not on file  . Attends Archivist Meetings: Not on file  . Marital Status: Not on file  Intimate Partner Violence:   . Fear of Current or Ex-Partner: Not on file  . Emotionally Abused: Not on file  . Physically Abused: Not on file  . Sexually Abused: Not on file    Current Outpatient Medications on File Prior to Visit  Medication Sig Dispense Refill  . Butalbital-APAP-Caffeine 50-325-40 MG capsule Take 1-2 capsules by mouth every 6 (six) hours as needed for headache. 3 capsule 0  . Doxylamine-Pyridoxine (DICLEGIS) 10-10 MG TBEC Take 2 tablets by mouth at  bedtime. If symptoms persist, add one tablet in the morning and one in the afternoon 100 tablet 5  . glucagon (GLUCAGON EMERGENCY) 1 MG injection 1 mg as needed.     Marland Kitchen glucose blood (FREESTYLE PRECISION NEO TEST) test strip Use to monitor glucose levels 8 times per day; E11.9 100 each 12  . hydrOXYzine (ATARAX/VISTARIL) 25 MG tablet TAKE 0.5-1 TABLETS (12.5-25 MG TOTAL) BY MOUTH DAILY AS NEEDED. ONLY FOR SEVERE PANIC ATTACKS 90 tablet 0  . insulin aspart (NOVOLOG) 100 UNIT/ML injection USE VIA INSULIN PUMP. MAX OF 80 UNITS PER DAY. 90 mL 3  . Prenatal Vit-Fe Fumarate-FA (PRENATAL MULTIVITAMIN) TABS tablet Take 1 tablet by mouth daily at 12 noon.     No current facility-administered medications on file prior to visit.    Allergies  Allergen Reactions  . Amoxicillin Anaphylaxis  . Penicillins Anaphylaxis    Family History  Problem Relation Age of Onset  . Asthma Sister   . Multiple sclerosis Sister   . Anxiety disorder Sister   . Depression Sister   . Depression Mother   . Breast cancer Neg Hx   . Ovarian cancer Neg Hx   . Colon cancer Neg Hx   . Diabetes Neg Hx   . Heart disease Neg Hx   . Diabetes gravidarum Neg Hx   . Diabetes type I Neg Hx     LMP 07/08/2019   Review of Systems Denies fever and sob    Objective:   Physical Exam      Assessment & Plan:  Type 1 DM: she needs increased rx Pregnancy: in this setting, she needs aggressive glycemic control.  Hypoglycemia: this limits aggressiveness of glycemic control  Patient Instructions  Please take these settings: basal rate of 1.4 units/hr, except 1.7 units/HR, 3 AM-6 AM.  mealtime bolus of 1 unit/3.3 grams carbohydrate (except please add 2 units to lunch bolus).    correction bolus (which some people call "sensitivity," or "insulin sensitivity ratio," or just "isr") of 1 unit for each 40 by which your glucose exceeds 110.  (I messaged these numbers to pt) Please come back for a follow-up appointment in 3 weeks,  in person if possible.

## 2019-10-30 ENCOUNTER — Encounter: Payer: BC Managed Care – PPO | Admitting: Obstetrics and Gynecology

## 2019-10-31 ENCOUNTER — Telehealth: Payer: Self-pay | Admitting: Obstetrics and Gynecology

## 2019-10-31 NOTE — Telephone Encounter (Signed)
Pt was returning a call from jamie. Pt sent a message about a work note for her covid test. Please advise

## 2019-11-01 NOTE — Telephone Encounter (Signed)
Pt called and aware that letter has been sent to her via mychart for clearance to return to work after COVID.

## 2019-11-01 NOTE — Progress Notes (Signed)
Letter sent to pt via mychart for work excuse clearance for COVID.

## 2019-11-01 NOTE — Telephone Encounter (Signed)
Pt called no answer LM via VM to call the office to speak more about what type of note she needed.

## 2019-11-06 ENCOUNTER — Telehealth: Payer: Self-pay | Admitting: Nutrition

## 2019-11-06 NOTE — Telephone Encounter (Signed)
We reviewed all topics on the Tandem pump training list.  She reported good understanding of all of these.  She has been using the pump X 2 months.  She signed off the checklist and had no final questions.

## 2019-11-11 ENCOUNTER — Encounter: Payer: Self-pay | Admitting: Licensed Clinical Social Worker

## 2019-11-11 ENCOUNTER — Ambulatory Visit (INDEPENDENT_AMBULATORY_CARE_PROVIDER_SITE_OTHER): Payer: BC Managed Care – PPO | Admitting: Licensed Clinical Social Worker

## 2019-11-11 ENCOUNTER — Other Ambulatory Visit: Payer: Self-pay

## 2019-11-11 DIAGNOSIS — F411 Generalized anxiety disorder: Secondary | ICD-10-CM

## 2019-11-11 NOTE — Progress Notes (Signed)
  Virtual Visit via Video Note  I connected with Jordan Leonard on 11/11/19 at  9:00 AM EST by a video enabled telemedicine application and verified that I am speaking with the correct person using two identifiers.   I discussed the limitations of evaluation and management by telemedicine and the availability of in person appointments. The patient expressed understanding and agreed to proceed.  I discussed the assessment and treatment plan with the patient. The patient was provided an opportunity to ask questions and all were answered. The patient agreed with the plan and demonstrated an understanding of the instructions.   The patient was advised to call back or seek an in-person evaluation if the symptoms worsen or if the condition fails to improve as anticipated.  I provided 57 minutes of non-face-to-face time during this encounter.   Heidi Dach, LCSW   THERAPIST PROGRESS NOTE  Session Time: 0900  Participation Level: Active  Behavioral Response: NeatAlertAnxious  Type of Therapy: Individual Therapy  Treatment Goals addressed: Anxiety  Interventions: CBT and Supportive  Summary: Jordan Leonard is a 29 y.o. female who presents with continued symptoms related to her diagnosis. Jordan Leonard reports doing well since our last session. She reports she was diagnosed with COVID-19, which was very difficult for her to manage emotionally. She reports she was able to manage her anxiety during the situation, but there were moments where she had to delegate tasks to others--which is something that has been difficult for her in the past. She reports she was able to reach out and ask for help, although she felt uncomfortable doing so in the moment. She reported feeling anxious about allowing others to help her, but was able to accept the help. Jordan Leonard reported she was primarily worried about her baby's health through everything, but has been assured by her doctors that the baby is doing  great. Jordan Leonard reports being very impressed by her husband's ability to step up and handle things in the house. Jordan Leonard reported she was also impressed with her ability to manage her anxiety int he moment, and cited CBT skills to manage that anxiety. LCSW validated Jordan Leonard's feelings on theses situations, and reviewed CBT skills she could utilize int he moment to improve cognitive restructuring. Jordan Leonard expressed understanding and agreement.   Suicidal/Homicidal: No  Therapist Response: Jordan Leonard continues to work towards her tx goals but has not yet reached them. We will continue to work on improving emotional regulation and CBT skills to address anxiety in the moment moving forward.  Plan: Return again in 3 weeks.  Diagnosis: Axis I: Generalized Anxiety Disorder    Axis II: No diagnosis    Heidi Dach, LCSW 11/11/2019

## 2019-11-12 ENCOUNTER — Ambulatory Visit: Payer: Self-pay | Admitting: Licensed Clinical Social Worker

## 2019-11-13 ENCOUNTER — Ambulatory Visit (INDEPENDENT_AMBULATORY_CARE_PROVIDER_SITE_OTHER): Payer: BC Managed Care – PPO | Admitting: Obstetrics and Gynecology

## 2019-11-13 ENCOUNTER — Encounter: Payer: Self-pay | Admitting: Obstetrics and Gynecology

## 2019-11-13 ENCOUNTER — Other Ambulatory Visit: Payer: Self-pay

## 2019-11-13 ENCOUNTER — Encounter (INDEPENDENT_AMBULATORY_CARE_PROVIDER_SITE_OTHER): Payer: Self-pay

## 2019-11-13 VITALS — BP 114/77 | HR 105 | Wt 194.6 lb

## 2019-11-13 DIAGNOSIS — E669 Obesity, unspecified: Secondary | ICD-10-CM

## 2019-11-13 DIAGNOSIS — E109 Type 1 diabetes mellitus without complications: Secondary | ICD-10-CM

## 2019-11-13 DIAGNOSIS — Z8616 Personal history of COVID-19: Secondary | ICD-10-CM | POA: Diagnosis not present

## 2019-11-13 DIAGNOSIS — O0992 Supervision of high risk pregnancy, unspecified, second trimester: Secondary | ICD-10-CM

## 2019-11-13 LAB — POCT URINALYSIS DIPSTICK OB
Bilirubin, UA: NEGATIVE
Blood, UA: NEGATIVE
Glucose, UA: NEGATIVE
Leukocytes, UA: NEGATIVE
Nitrite, UA: NEGATIVE
POC,PROTEIN,UA: NEGATIVE
Spec Grav, UA: 1.01 (ref 1.010–1.025)
Urobilinogen, UA: 0.2 E.U./dL
pH, UA: 7.5 (ref 5.0–8.0)

## 2019-11-13 NOTE — Progress Notes (Signed)
ROB-Pt present for routine prenatal care. Pt stated that she was doing well no problems.  

## 2019-11-13 NOTE — Progress Notes (Signed)
ROB: Doing well, no complaints. Notes that she recently had COVID-19 infection 3 weeks ago, had issues with tolerating diet (nausea/vomiting) so this threw her blood sugars off. She has some ketonuria today.  Sees Endocrinologist next week. For referral to [facility] for anatomy scan and fetal echo. Discussed need for antenatal testing in 3rd trimester.  RTC in 4 weeks.

## 2019-11-14 ENCOUNTER — Encounter (INDEPENDENT_AMBULATORY_CARE_PROVIDER_SITE_OTHER): Payer: Self-pay

## 2019-11-15 ENCOUNTER — Encounter (INDEPENDENT_AMBULATORY_CARE_PROVIDER_SITE_OTHER): Payer: Self-pay

## 2019-11-18 ENCOUNTER — Encounter (INDEPENDENT_AMBULATORY_CARE_PROVIDER_SITE_OTHER): Payer: Self-pay

## 2019-11-18 ENCOUNTER — Telehealth: Payer: Self-pay | Admitting: Endocrinology

## 2019-11-18 NOTE — Telephone Encounter (Signed)
Is it okay for pt to still come into the office?

## 2019-11-18 NOTE — Telephone Encounter (Signed)
Patient got a positive result on 10/24/19 and is scheduled for this Wednesday the 17th, she is asking that its in person and not virtual but I told her I would have to check with the Doctor first.

## 2019-11-18 NOTE — Telephone Encounter (Signed)
In person is OK

## 2019-11-19 ENCOUNTER — Encounter (INDEPENDENT_AMBULATORY_CARE_PROVIDER_SITE_OTHER): Payer: Self-pay

## 2019-11-19 NOTE — Telephone Encounter (Signed)
Please refer to Dr. Ellison's response 

## 2019-11-20 ENCOUNTER — Ambulatory Visit (INDEPENDENT_AMBULATORY_CARE_PROVIDER_SITE_OTHER): Payer: BC Managed Care – PPO | Admitting: Endocrinology

## 2019-11-20 ENCOUNTER — Encounter (INDEPENDENT_AMBULATORY_CARE_PROVIDER_SITE_OTHER): Payer: Self-pay

## 2019-11-20 ENCOUNTER — Encounter: Payer: Self-pay | Admitting: Endocrinology

## 2019-11-20 ENCOUNTER — Other Ambulatory Visit: Payer: Self-pay

## 2019-11-20 VITALS — BP 142/70 | HR 123 | Ht 60.0 in | Wt 194.0 lb

## 2019-11-20 DIAGNOSIS — E109 Type 1 diabetes mellitus without complications: Secondary | ICD-10-CM | POA: Diagnosis not present

## 2019-11-20 LAB — POCT GLYCOSYLATED HEMOGLOBIN (HGB A1C): Hemoglobin A1C: 5.8 % — AB (ref 4.0–5.6)

## 2019-11-20 NOTE — Progress Notes (Signed)
Subjective:    Patient ID: Jordan Leonard, female    DOB: Feb 05, 1991, 29 y.o.   MRN: [id number]  HPI Pt returns for f/u of diabetes mellitus:  DM type: 1 Dx'ed: 2000 Complications: none Therapy: insulin since dx, and pump rx since 2005 (Tandem pump and Dexcom G6 continuous glucose monitor since 2020).   GDM: never.  DKA: only once, at dx Severe hypoglycemia: never.  Pancreatitis: never Pancreatic imaging: never.  Other: she eats meals at 8 AM, noon, and 7 PM; she works from home now.   Interval history:  She takes these pump settings:  basal rate of 1.4 units/hr, except 1.7 units/HR, 3 AM-6 AM.  mealtime bolus of 1 unit/3.3 grams carbohydrate (except please add 2 units to lunch bolus).    correction bolus (which some people call "sensitivity," or "insulin sensitivity ratio," or just "isr") of 1 unit for each 40 by which your glucose exceeds 110.  She is [redacted] weeks pregnant.  continuous glucose monitor is downloaded today, and the printout is scanned into the record.  glucose varies from 40-255.  There is no trend throughout the day, except it is highest in the afternoon.  Pt glucose averages 117. TDD is 43 units (56% basal).   Past Medical History:  Diagnosis Date  . ADHD   . Anxiety   . COVID-19   . Diabetes mellitus type 1 (HCC)   . History of cesarean section 09/01/2016  . History of chlamydia infection 09/01/2016   2007  . STD (sexually transmitted disease)    chlaymdia- 10 years ago    Past Surgical History:  Procedure Laterality Date  . CESAREAN SECTION      Social History   Socioeconomic History  . Marital status: Married    Spouse name: Not on file  . Number of children: 1  . Years of education: Not on file  . Highest education level: Not on file  Occupational History  . Not on file  Tobacco Use  . Smoking status: Never Smoker  . Smokeless tobacco: Never Used  Substance and Sexual Activity  . Alcohol use: Not Currently    Comment: rare  . Drug  use: No  . Sexual activity: Yes  Other Topics Concern  . Not on file  Social History Narrative   Married.    1 child.   Works at [employer].   Enjoys being with family, working on mind puzzles   Social Determinants of Corporate investment banker Strain:   . Difficulty of Paying Living Expenses: Not on file  Food Insecurity:   . Worried About Programme researcher, broadcasting/film/video in the Last Year: Not on file  . Ran Out of Food in the Last Year: Not on file  Transportation Needs:   . Lack of Transportation (Medical): Not on file  . Lack of Transportation (Non-Medical): Not on file  Physical Activity:   . Days of Exercise per Week: Not on file  . Minutes of Exercise per Session: Not on file  Stress:   . Feeling of Stress : Not on file  Social Connections:   . Frequency of Communication with Friends and Family: Not on file  . Frequency of Social Gatherings with Friends and Family: Not on file  . Attends Religious Services: Not on file  . Active Member of Clubs or Organizations: Not on file  . Attends Banker Meetings: Not on file  . Marital Status: Not on file  Intimate Partner Violence:   .  Fear of Current or Ex-Partner: Not on file  . Emotionally Abused: Not on file  . Physically Abused: Not on file  . Sexually Abused: Not on file    Current Outpatient Medications on File Prior to Visit  Medication Sig Dispense Refill  . aspirin EC 81 MG tablet Take 81 mg by mouth daily.    . Butalbital-APAP-Caffeine 50-325-40 MG capsule Take 1-2 capsules by mouth every 6 (six) hours as needed for headache. 3 capsule 0  . Doxylamine-Pyridoxine (DICLEGIS) 10-10 MG TBEC Take 2 tablets by mouth at bedtime. If symptoms persist, add one tablet in the morning and one in the afternoon 100 tablet 5  . glucagon (GLUCAGON EMERGENCY) 1 MG injection 1 mg as needed.     Marland Kitchen glucose blood (FREESTYLE PRECISION NEO TEST) test strip Use to monitor glucose levels 8 times per day; E11.9 100 each 12  .  hydrOXYzine (ATARAX/VISTARIL) 25 MG tablet TAKE 0.5-1 TABLETS (12.5-25 MG TOTAL) BY MOUTH DAILY AS NEEDED. ONLY FOR SEVERE PANIC ATTACKS 90 tablet 0  . insulin aspart (NOVOLOG) 100 UNIT/ML injection USE VIA INSULIN PUMP. MAX OF 80 UNITS PER DAY. 90 mL 3  . Prenatal Vit-Fe Fumarate-FA (PRENATAL MULTIVITAMIN) TABS tablet Take 1 tablet by mouth daily at 12 noon.     No current facility-administered medications on file prior to visit.    Allergies  Allergen Reactions  . Amoxicillin Anaphylaxis  . Penicillins Anaphylaxis    Family History  Problem Relation Age of Onset  . Asthma Sister   . Multiple sclerosis Sister   . Anxiety disorder Sister   . Depression Sister   . Depression Mother   . Breast cancer Neg Hx   . Ovarian cancer Neg Hx   . Colon cancer Neg Hx   . Diabetes Neg Hx   . Heart disease Neg Hx   . Diabetes gravidarum Neg Hx   . Diabetes type I Neg Hx     BP (!) 142/70 (BP Location: Left Arm, Patient Position: Sitting, Cuff Size: Large)   Pulse (!) 123   Ht 5' (1.524 m)   Wt 194 lb (88 kg)   LMP 07/08/2019   SpO2 100%   BMI 37.89 kg/m    Review of Systems Denies LOC.  She has gained no weight so far.      Objective:   Physical Exam VITAL SIGNS:  See vs page GENERAL: no distress Pulses: dorsalis pedis intact bilat.   MSK: no deformity of the feet CV: no leg edema Skin:  no ulcer on the feet.  normal color and temp on the feet. Neuro: sensation is intact to touch on the feet.    Lab Results  Component Value Date   HGBA1C 5.8 (A) 11/20/2019   Lab Results  Component Value Date   TSH 3.13 09/19/2019   Lab Results  Component Value Date   CREATININE 0.63 10/10/2019   BUN 12 10/10/2019   NA 137 10/10/2019   K 3.5 10/10/2019   CL 105 10/10/2019   CO2 22 10/10/2019       Assessment & Plan:  Type 1 DM: based on the pattern of her cbg's, she needs some adjustment in her therapy.  Pregnancy: in this setting, she needs aggressive glycemic control.     Hypoglycemia: this limits aggressiveness of glycemic control HTN: is noted today.    Patient Instructions  Your blood pressure is high today.  Please follow up with your OB dr, to have it rechecked.  Please take  these settings: basal rate of 1.4 units/hr, except 1.6 units/HR, 3 AM-6 AM.  mealtime bolus of 1 unit/3.3 grams carbohydrate (except please add 4 units to lunch bolus).    correction bolus (which some people call "sensitivity," or "insulin sensitivity ratio," or just "isr") of 1 unit for each 40 by which your glucose exceeds 110.  Please come back for a follow-up appointment in 3-4 weeks.

## 2019-11-20 NOTE — Patient Instructions (Addendum)
Your blood pressure is high today.  Please follow up with your OB dr, to have it rechecked.  Please take these settings: basal rate of 1.4 units/hr, except 1.6 units/HR, 3 AM-6 AM.  mealtime bolus of 1 unit/3.3 grams carbohydrate (except please add 4 units to lunch bolus).    correction bolus (which some people call "sensitivity," or "insulin sensitivity ratio," or just "isr") of 1 unit for each 40 by which your glucose exceeds 110.  Please come back for a follow-up appointment in 3-4 weeks.

## 2019-11-21 ENCOUNTER — Telehealth: Payer: Self-pay | Admitting: Obstetrics and Gynecology

## 2019-11-21 ENCOUNTER — Encounter (INDEPENDENT_AMBULATORY_CARE_PROVIDER_SITE_OTHER): Payer: Self-pay

## 2019-11-21 NOTE — Telephone Encounter (Signed)
Per previous conversation in regards to Perinatal referral. I called [facility] today to f/u on referral I left [facility]. It is my understanding they are only open Mon. and Thurs. Since the referral was marked as urgent would you like me to look for an alternative resource.

## 2019-11-22 ENCOUNTER — Encounter (INDEPENDENT_AMBULATORY_CARE_PROVIDER_SITE_OTHER): Payer: Self-pay

## 2019-11-23 ENCOUNTER — Encounter (INDEPENDENT_AMBULATORY_CARE_PROVIDER_SITE_OTHER): Payer: Self-pay

## 2019-11-24 ENCOUNTER — Encounter (INDEPENDENT_AMBULATORY_CARE_PROVIDER_SITE_OTHER): Payer: Self-pay

## 2019-11-25 ENCOUNTER — Other Ambulatory Visit: Payer: Self-pay | Admitting: Obstetrics and Gynecology

## 2019-11-25 ENCOUNTER — Encounter (INDEPENDENT_AMBULATORY_CARE_PROVIDER_SITE_OTHER): Payer: Self-pay

## 2019-11-25 DIAGNOSIS — E109 Type 1 diabetes mellitus without complications: Secondary | ICD-10-CM

## 2019-11-25 DIAGNOSIS — O0992 Supervision of high risk pregnancy, unspecified, second trimester: Secondary | ICD-10-CM

## 2019-11-25 DIAGNOSIS — E669 Obesity, unspecified: Secondary | ICD-10-CM

## 2019-11-26 ENCOUNTER — Encounter (INDEPENDENT_AMBULATORY_CARE_PROVIDER_SITE_OTHER): Payer: Self-pay

## 2019-11-27 ENCOUNTER — Other Ambulatory Visit: Payer: Self-pay

## 2019-11-27 ENCOUNTER — Other Ambulatory Visit: Payer: Self-pay | Admitting: Obstetrics and Gynecology

## 2019-11-27 ENCOUNTER — Ambulatory Visit (INDEPENDENT_AMBULATORY_CARE_PROVIDER_SITE_OTHER): Payer: BC Managed Care – PPO

## 2019-11-27 DIAGNOSIS — Z3689 Encounter for other specified antenatal screening: Secondary | ICD-10-CM

## 2019-11-27 DIAGNOSIS — O99212 Obesity complicating pregnancy, second trimester: Secondary | ICD-10-CM

## 2019-11-27 DIAGNOSIS — Z3A20 20 weeks gestation of pregnancy: Secondary | ICD-10-CM | POA: Diagnosis not present

## 2019-11-27 DIAGNOSIS — Z3492 Encounter for supervision of normal pregnancy, unspecified, second trimester: Secondary | ICD-10-CM

## 2019-11-27 DIAGNOSIS — Z363 Encounter for antenatal screening for malformations: Secondary | ICD-10-CM | POA: Diagnosis not present

## 2019-11-27 NOTE — Telephone Encounter (Signed)
Pt called to see if she heard anything from perinatal. Pt stated that she spoke with someone on Monday and scheduled all the appointments that she needed with them.

## 2019-11-28 ENCOUNTER — Ambulatory Visit (HOSPITAL_BASED_OUTPATIENT_CLINIC_OR_DEPARTMENT_OTHER)
Admission: RE | Admit: 2019-11-28 | Discharge: 2019-11-28 | Disposition: A | Discharge status: Home / Self Care | Payer: BC Managed Care – PPO | Source: Ambulatory Visit | Attending: Obstetrics and Gynecology | Admitting: Obstetrics and Gynecology

## 2019-11-28 ENCOUNTER — Ambulatory Visit
Admission: RE | Admit: 2019-11-28 | Discharge: 2019-11-28 | Disposition: A | Discharge status: Home / Self Care | Payer: BC Managed Care – PPO | Source: Ambulatory Visit | Attending: Obstetrics and Gynecology | Admitting: Obstetrics and Gynecology

## 2019-11-28 DIAGNOSIS — O24012 Pre-existing diabetes mellitus, type 1, in pregnancy, second trimester: Secondary | ICD-10-CM | POA: Diagnosis not present

## 2019-11-28 DIAGNOSIS — Z794 Long term (current) use of insulin: Secondary | ICD-10-CM | POA: Diagnosis not present

## 2019-11-28 DIAGNOSIS — Z3A20 20 weeks gestation of pregnancy: Secondary | ICD-10-CM | POA: Diagnosis not present

## 2019-11-28 DIAGNOSIS — E66811 Obesity, class 1: Secondary | ICD-10-CM | POA: Diagnosis present

## 2019-11-28 DIAGNOSIS — O0992 Supervision of high risk pregnancy, unspecified, second trimester: Secondary | ICD-10-CM

## 2019-11-28 DIAGNOSIS — E109 Type 1 diabetes mellitus without complications: Secondary | ICD-10-CM

## 2019-11-28 DIAGNOSIS — E669 Obesity, unspecified: Secondary | ICD-10-CM | POA: Diagnosis present

## 2019-11-28 DIAGNOSIS — Z7982 Long term (current) use of aspirin: Secondary | ICD-10-CM | POA: Diagnosis not present

## 2019-11-28 DIAGNOSIS — O34219 Maternal care for unspecified type scar from previous cesarean delivery: Secondary | ICD-10-CM | POA: Insufficient documentation

## 2019-11-28 DIAGNOSIS — Z8616 Personal history of COVID-19: Secondary | ICD-10-CM | POA: Insufficient documentation

## 2019-11-28 DIAGNOSIS — O321XX0 Maternal care for breech presentation, not applicable or unspecified: Secondary | ICD-10-CM | POA: Insufficient documentation

## 2019-11-28 DIAGNOSIS — Z880 Allergy status to penicillin: Secondary | ICD-10-CM | POA: Insufficient documentation

## 2019-11-28 DIAGNOSIS — Z825 Family history of asthma and other chronic lower respiratory diseases: Secondary | ICD-10-CM | POA: Insufficient documentation

## 2019-11-28 DIAGNOSIS — Z820 Family history of epilepsy and other diseases of the nervous system: Secondary | ICD-10-CM | POA: Insufficient documentation

## 2019-11-28 DIAGNOSIS — Z9641 Presence of insulin pump (external) (internal): Secondary | ICD-10-CM

## 2019-11-28 DIAGNOSIS — Z8249 Family history of ischemic heart disease and other diseases of the circulatory system: Secondary | ICD-10-CM | POA: Insufficient documentation

## 2019-11-28 DIAGNOSIS — F900 Attention-deficit hyperactivity disorder, predominantly inattentive type: Secondary | ICD-10-CM | POA: Diagnosis present

## 2019-11-28 HISTORY — DX: Headache, unspecified: R51.9

## 2019-11-28 NOTE — Progress Notes (Signed)
[facility] Consultation   Chief Complaint: Type 1 Diabetes in pregnancy   HPI: [name] is a 29 y.o. G1P0101 at [redacted]w[redacted]d by LMP and early u/s  who presents in consultation from  [facility] for counseling regarding type 1 diabetes in pregnancy  Patient reports that she has had diabetes since age 58.  She is here with her mother today.  They said that at age 7 she developed headaches weight loss back pain.  Then she was unable to get off the couch and her mother took her into urgent care and her blood sugar was 535.  She is going into diabetic ketoacidosis and required ICU admission for several days.  She is on injectable insulin for about 10 years and has now been on the pump for about 10 years.  She states she still has hypoglycemia awareness when her blood sugar drops to about 50.  She has woken up with low blood sugar in the past.  She likes her pump and Dexcom.  She is currently followed by Dr. Renato Shin and is seeing him every 3 to 4 weeks to adjust her insulin pump.  She reports no foot problems no eye problems and no kidney problems she has her eyes checked annually.  In her first pregnancy she states she was not taking his good care of herself as she is now.  When she first got pregnant in her first pregnancy with her 37-year-old her hemoglobin A1c was 13 conception by the end of pregnancy where she was down to 6.  She discusses how traumatic it was and how bad she felt about her poor blood sugars.  She had no DKA or severe hypoglycemic spells during that pregnancy.  She was induced at 37 weeks.  She required an emergency cesarean.  The patient says she had no preeclampsia.  Her daughter weighed 7-1/2 pounds and had some immediate hypoglycemia that responded to formula.  She was discharged home with the patient.  She is a healthy 67-year-old.  Patient does relate a story of a postpartum headache that was severe she feels like it affected her ability to bond with her  infant and her ability to breast-feed.  She states that it did not seem to be related to the regional anesthesia.  She says it was described as a hormonal migraine but she does not report similar headaches outside of that situation.  She had difficulty breast-feeding and eventually went to full formula feeding.  The patient is proud of the job she is done during this pregnancy.  Her last hemoglobin A1c was 5.8 and a conception it was 6.6.  She has a fetal echo scheduled.  She is taking a baby aspirin daily.  She carries Coca-Cola with her for hypoglycemic episodes.  She has a glucagon pen that has expired and she would like a refill.  She does not have keto sticks at home.  She had an isolated elevated blood pressure with her endocrinologist today's blood pressure is normal  Past Medical History: Patient  has a past medical history of ADHD, ADHD, Anxiety, COVID-19, Diabetes mellitus type 1 ([place]), Headache, History of cesarean section (09/01/2016), History of chlamydia infection (09/01/2016), and STD (sexually transmitted disease).  Past Surgical History: She  has a past surgical history that includes Cesarean section; Toe Surgery (Right, 2020); and Wisdom tooth extraction (Bilateral).  Obstetric History:  OB History    Gravida  1   Para  1   Term  0  Preterm  1   AB      Living  1     SAB      TAB      Ectopic      Multiple  1   Live Births  1          Gynecologic History:  Patient's last menstrual period was 07/08/2019.   Medications:   Current Outpatient Medications:  .  aspirin EC 81 MG tablet, Take 81 mg by mouth daily., Disp: , Rfl:  .  Butalbital-APAP-Caffeine 50-325-40 MG capsule, Take 1-2 capsules by mouth every 6 (six) hours as needed for headache., Disp: 3 capsule, Rfl: 0 .  Doxylamine-Pyridoxine (DICLEGIS) 10-10 MG TBEC, Take 2 tablets by mouth at bedtime. If symptoms persist, add one tablet in the morning and one in the afternoon, Disp: 100 tablet, Rfl: 5 .   glucagon (GLUCAGON EMERGENCY) 1 MG injection, 1 mg as needed. , Disp: , Rfl:  .  glucose blood (FREESTYLE PRECISION NEO TEST) test strip, Use to monitor glucose levels 8 times per day; E11.9, Disp: 100 each, Rfl: 12 .  hydrOXYzine (ATARAX/VISTARIL) 25 MG tablet, TAKE 0.5-1 TABLETS (12.5-25 MG TOTAL) BY MOUTH DAILY AS NEEDED. ONLY FOR SEVERE PANIC ATTACKS, Disp: 90 tablet, Rfl: 0 .  insulin aspart (NOVOLOG) 100 UNIT/ML injection, USE VIA INSULIN PUMP. MAX OF 80 UNITS PER DAY., Disp: 90 mL, Rfl: 3 .  Prenatal Vit-Fe Fumarate-FA (PRENATAL MULTIVITAMIN) TABS tablet, Take 1 tablet by mouth daily at 12 noon., Disp: , Rfl:  Allergies: Patient is allergic to amoxicillin and penicillins.  Social History: Patient  reports that she has never smoked. She has never used smokeless tobacco. She reports previous alcohol use. She reports that she does not use drugs.  Family History: family history includes Anxiety disorder in her sister; Asthma in her sister; Depression in her mother and sister; Hypertension in her father and mother; Multiple sclerosis in her sister.  Review of Systems A full 12 point review of systems was negative or as noted in the History of Present Illness.  Physical Exam: LMP 07/08/2019  Today's Vitals   11/28/19 1109  BP: 123/76  Pulse: (!) 108  Temp: 98.1 F (36.7 C)  Weight: 88.7 kg   Body mass index is 38.18 kg/m.;  BS 125  dexcom 91   Asessement:  IUP at 20 weeks 3 days -Type 1 diabetes of 20 years duration-no recent DKA hypoglycemia awareness, no vascular disease, patient understands that there is a approximately 10% risk of her children getting type 1 diabetes, she notes that no one else in her family has had type 1 diabetes -History of cesarean -"Postpartum migraine" in the last pregnancy Plan: 1-I supported the patient in her excellent glucose control with Dr. Everardo All, she reflects on the guilt she felt in her last pregnancy due to her poor blood sugars I applauded her  efforts during this pregnancy.  I told her I preferred for her to avoid lows, so to maintain her blood sugars without lows.  I encouraged her to have consistency of activity and eating habits to assist her in her efforts. 2-history of cesarean - patient is planning an elective repeat cesarean 3-encouraged her to continue her baby aspirin daily as she is at high risk for preeclampsia given her type       1 diabetes and her obesity 4-I encouraged her to try breast-feeding again 5-I recommended monthly growth scans in the third trimester I ordered her to return in 6 weeks,  I recommended antenatal testing to start at 32 weeks at least weekly and then 34 weeks twice weekly I recommended delivery at 37 weeks 6-I am unsure of the cause of her postpartum headache in the last pregnancy so I have no suggestions as to how to prevent it.  Certainly if she were to develop a postpartum headache again assessment for preeclampsia or dural puncture headache would be indicated     Total time spent with the patient was 30 minutes with greater than 50% spent in counseling and coordination of care. We appreciate this interesting consult and will be happy to be involved in the ongoing care of Ms. Cephas in anyway her obstetricians desire.  Jimmey Ralph [facility]

## 2019-11-28 NOTE — Progress Notes (Signed)
Today's Vitals   11/28/19 1109  BP: 123/76  Pulse: (!) 108  Temp: 98.1 F (36.7 C)  Weight: 88.7 kg   Body mass index is 38.18 kg/m.;

## 2019-12-02 ENCOUNTER — Other Ambulatory Visit: Payer: Self-pay

## 2019-12-02 ENCOUNTER — Encounter: Payer: Self-pay | Admitting: Licensed Clinical Social Worker

## 2019-12-02 ENCOUNTER — Ambulatory Visit (INDEPENDENT_AMBULATORY_CARE_PROVIDER_SITE_OTHER): Payer: BC Managed Care – PPO | Admitting: Licensed Clinical Social Worker

## 2019-12-02 DIAGNOSIS — F411 Generalized anxiety disorder: Secondary | ICD-10-CM

## 2019-12-02 LAB — GLUCOSE, CAPILLARY: Glucose-Capillary: 125 mg/dL — ABNORMAL HIGH (ref 70–99)

## 2019-12-02 NOTE — Progress Notes (Signed)
Virtual Visit via Video Note  I connected with Su Ley on 12/02/19 at  9:00 AM EST by a video enabled telemedicine application and verified that I am speaking with the correct person using two identifiers.   I discussed the limitations of evaluation and management by telemedicine and the availability of in person appointments. The patient expressed understanding and agreed to proceed.  I discussed the assessment and treatment plan with the patient. The patient was provided an opportunity to ask questions and all were answered. The patient agreed with the plan and demonstrated an understanding of the instructions.   The patient was advised to call back or seek an in-person evaluation if the symptoms worsen or if the condition fails to improve as anticipated.  I provided 60 minutes of non-face-to-face time during this encounter.   Heidi Dach, LCSW    THERAPIST PROGRESS NOTE  Session Time: 1100  Participation Level: Active  Behavioral Response: NeatAlertAnxious  Type of Therapy: Individual Therapy  Treatment Goals addressed: Coping  Interventions: Supportive  Summary: VELINA DROLLINGER is a 29 y.o. female who presents with continued symptoms related to her diagnosis. Areona reports doing well since our last session. Brynlee reports having a few "moments of anxiety," but was able to manage her feelings in the moment and work through them. LCSW asked Chancey if she was comfortable sharing thosemoments and how she handled them. Addilyn reports the primary issue was her father responding to a situation that was not super important, and lingering back towards his negativity towards Tristyn's husband. She reports handling the situation by saying "okay," in the moment, and then giving herself two days to cool off before rejoining the conversation. LCSW validated Silvie's feelings around the situation, and held space for her to discuss her thoughts. The other issue was  around her sister who has MS, and Whitleigh worries that she does not come out often enough due to her fear of COVID. LCSW encouraged Marnisha to open the conversation with her sister, and to lean into the moments she feels might be awkward by stating that in the moment. We walked through several examples of how she could best utilize this in the moment, and Keyatta expressed understanding and agreement.   Suicidal/Homicidal: No  Therapist Response: Mylissa continues to work towards her tx goals but has not yet reached them. She continues to work on managing anxiety symptoms in the moment, and improving communication. She has made great progress in implementing CBT skills learned during session and applying them to her life in order to manage anxiety in various situations.   Plan: Return again in 4 weeks.  Diagnosis: Axis I: Generalized Anxiety Disorder    Axis II: No diagnosis    Heidi Dach, LCSW 12/02/2019

## 2019-12-09 ENCOUNTER — Other Ambulatory Visit: Payer: Self-pay

## 2019-12-11 ENCOUNTER — Other Ambulatory Visit: Payer: Self-pay | Admitting: Obstetrics and Gynecology

## 2019-12-11 ENCOUNTER — Encounter: Payer: Self-pay | Admitting: Endocrinology

## 2019-12-11 ENCOUNTER — Ambulatory Visit (INDEPENDENT_AMBULATORY_CARE_PROVIDER_SITE_OTHER): Payer: BC Managed Care – PPO

## 2019-12-11 ENCOUNTER — Other Ambulatory Visit: Payer: Self-pay

## 2019-12-11 ENCOUNTER — Ambulatory Visit (INDEPENDENT_AMBULATORY_CARE_PROVIDER_SITE_OTHER): Payer: BC Managed Care – PPO | Admitting: Endocrinology

## 2019-12-11 ENCOUNTER — Ambulatory Visit (INDEPENDENT_AMBULATORY_CARE_PROVIDER_SITE_OTHER): Payer: BC Managed Care – PPO | Admitting: Obstetrics and Gynecology

## 2019-12-11 VITALS — BP 120/70 | HR 113 | Ht 60.0 in | Wt 194.8 lb

## 2019-12-11 VITALS — BP 109/72 | HR 97 | Wt 195.7 lb

## 2019-12-11 DIAGNOSIS — Z3A22 22 weeks gestation of pregnancy: Secondary | ICD-10-CM

## 2019-12-11 DIAGNOSIS — Z09 Encounter for follow-up examination after completed treatment for conditions other than malignant neoplasm: Secondary | ICD-10-CM

## 2019-12-11 DIAGNOSIS — O0992 Supervision of high risk pregnancy, unspecified, second trimester: Secondary | ICD-10-CM

## 2019-12-11 DIAGNOSIS — Z362 Encounter for other antenatal screening follow-up: Secondary | ICD-10-CM

## 2019-12-11 DIAGNOSIS — E109 Type 1 diabetes mellitus without complications: Secondary | ICD-10-CM | POA: Diagnosis not present

## 2019-12-11 LAB — POCT URINALYSIS DIPSTICK OB
Bilirubin, UA: NEGATIVE
Blood, UA: NEGATIVE
Glucose, UA: NEGATIVE
Ketones, UA: NEGATIVE
Leukocytes, UA: NEGATIVE
Nitrite, UA: NEGATIVE
POC,PROTEIN,UA: NEGATIVE
Spec Grav, UA: 1.01 (ref 1.010–1.025)
Urobilinogen, UA: 0.2 E.U./dL
pH, UA: 7 (ref 5.0–8.0)

## 2019-12-11 NOTE — Progress Notes (Signed)
Subjective:    Patient ID: Jordan Leonard, female    DOB: [birth year], 29 y.o.   MRN: [id number]  HPI Pt returns for f/u of diabetes mellitus:  DM type: 1 Dx'ed: 9509 Complications: none Therapy: insulin since dx, and pump rx since 2005 (Tandem pump and Dexcom G6 continuous glucose monitor since 2020).   GDM: never.  DKA: only once, at dx Severe hypoglycemia: never.  Pancreatitis: never Pancreatic imaging: never.  Other: she eats meals at 9 AM and 5 PM; she works from home now.   Interval history:  She takes these pump settings:  basal rate of 1.5 units/hr, except 1.6 units/HR, 9 PM-6 AM.  mealtime bolus of 1 unit/3.3 grams carbohydrate (except please add 4 units to lunch bolus).    correction bolus (which some people call "sensitivity," or "insulin sensitivity ratio," or just "isr") of 1 unit for each 40 by which your glucose exceeds 110.  She is [redacted] weeks pregnant.  continuous glucose monitor is downloaded today, and the printout is scanned into the record.  glucose varies from 55-310.  It is in general highest fasting.   TDD is 55 units (48% basal).   Past Medical History:  Diagnosis Date  . ADHD   . ADHD   . Anxiety   . COVID-19   . Diabetes mellitus type 1 (HCC)    Type 1  . Headache    Hormonal Migraines   . History of cesarean section 09/01/2016  . History of chlamydia infection 09/01/2016   2007  . STD (sexually transmitted disease)    chlaymdia- 10 years ago    Past Surgical History:  Procedure Laterality Date  . CESAREAN SECTION    . TOE SURGERY Right 2020  . WISDOM TOOTH EXTRACTION Bilateral    Highschool    Social History   Socioeconomic History  . Marital status: Married    Spouse name: Larkin Ina  . Number of children: 1  . Years of education: Not on file  . Highest education level: Not on file  Occupational History  . Not on file  Tobacco Use  . Smoking status: Never Smoker  . Smokeless tobacco: Never Used  Substance and Sexual Activity  .  Alcohol use: Not Currently    Comment: rare  . Drug use: No  . Sexual activity: Yes  Other Topics Concern  . Not on file  Social History Narrative   Married.    1 child.   Works at [employer].   Enjoys being with family, working on mind puzzles   Social Determinants of Radio broadcast assistant Strain:   . Difficulty of Paying Living Expenses:   Food Insecurity:   . Worried About Charity fundraiser in the Last Year:   . Arboriculturist in the Last Year:   Transportation Needs:   . Film/video editor (Medical):   Marland Kitchen Lack of Transportation (Non-Medical):   Physical Activity:   . Days of Exercise per Week:   . Minutes of Exercise per Session:   Stress:   . Feeling of Stress :   Social Connections:   . Frequency of Communication with Friends and Family:   . Frequency of Social Gatherings with Friends and Family:   . Attends Religious Services:   . Active Member of Clubs or Organizations:   . Attends Archivist Meetings:   Marland Kitchen Marital Status:   Intimate Partner Violence:   . Fear of Current or Ex-Partner:   .  Emotionally Abused:   Marland Kitchen Physically Abused:   . Sexually Abused:     Current Outpatient Medications on File Prior to Visit  Medication Sig Dispense Refill  . aspirin EC 81 MG tablet Take 81 mg by mouth daily.    . Butalbital-APAP-Caffeine 50-325-40 MG capsule Take 1-2 capsules by mouth every 6 (six) hours as needed for headache. 3 capsule 0  . Doxylamine-Pyridoxine (DICLEGIS) 10-10 MG TBEC Take 2 tablets by mouth at bedtime. If symptoms persist, add one tablet in the morning and one in the afternoon 100 tablet 5  . glucagon (GLUCAGON EMERGENCY) 1 MG injection 1 mg as needed.     Marland Kitchen glucose blood (FREESTYLE PRECISION NEO TEST) test strip Use to monitor glucose levels 8 times per day; E11.9 100 each 12  . hydrOXYzine (ATARAX/VISTARIL) 25 MG tablet TAKE 0.5-1 TABLETS (12.5-25 MG TOTAL) BY MOUTH DAILY AS NEEDED. ONLY FOR SEVERE PANIC ATTACKS 90 tablet  0  . insulin aspart (NOVOLOG) 100 UNIT/ML injection USE VIA INSULIN PUMP. MAX OF 80 UNITS PER DAY. 90 mL 3  . Prenatal Vit-Fe Fumarate-FA (PRENATAL MULTIVITAMIN) TABS tablet Take 1 tablet by mouth daily at 12 noon.     No current facility-administered medications on file prior to visit.    Allergies  Allergen Reactions  . Amoxicillin Anaphylaxis  . Penicillins Anaphylaxis    Family History  Problem Relation Age of Onset  . Asthma Sister   . Multiple sclerosis Sister   . Anxiety disorder Sister   . Depression Sister   . Depression Mother   . Hypertension Mother   . Hypertension Father   . Breast cancer Neg Hx   . Ovarian cancer Neg Hx   . Colon cancer Neg Hx   . Diabetes Neg Hx   . Heart disease Neg Hx   . Diabetes gravidarum Neg Hx   . Diabetes type I Neg Hx     BP 120/70 (BP Location: Left Arm, Patient Position: Sitting, Cuff Size: Large)   Pulse (!) 113   Ht 5' (1.524 m)   Wt 194 lb 12.8 oz (88.4 kg)   LMP 07/08/2019   SpO2 100%   BMI 38.04 kg/m    Review of Systems Denies LOC    Objective:   Physical Exam VITAL SIGNS:  See vs page GENERAL: no distress Pulses: dorsalis pedis intact bilat.   MSK: no deformity of the feet CV: no leg edema Skin:  no ulcer on the feet.  normal color and temp on the feet. Neuro: sensation is intact to touch on the feet  Lab Results  Component Value Date   TSH 3.13 09/19/2019     Lab Results  Component Value Date   HGBA1C 5.8 (A) 11/20/2019        Assessment & Plan:  Type 1 DM: she needs increased rx Pregnancy: she needs aggressive glycemic control Tachycardia: in view of recent normal TSH, this is likely due to pregnancy  Patient Instructions  Please take these settings:  basal rate of 1.5 units/hr, except 1.6 units/HR, 9 PM-6 AM.  mealtime bolus of 1 unit/3.1 grams carbohydrate (except please add 4 units to lunch bolus).    correction bolus (which some people call "sensitivity," or "insulin sensitivity ratio,"  or just "isr") of 1 unit for each 40 by which your glucose exceeds 110.   Please come back for a follow-up appointment in 4 weeks.

## 2019-12-11 NOTE — Progress Notes (Signed)
Patient comes in today for ROB visit. She has no concerns today.  

## 2019-12-11 NOTE — Progress Notes (Signed)
ROB: Patient carefully monitoring her sugars and insulin.  Latest hemoglobin A1c 5.8 which is much improved from her November A1c.  Patient has fetal echo scheduled tomorrow.  She continues to take aspirin and prenatal vitamins.  Is feeling daily fetal movement.  37-week timing of delivery as well as serial growth scans and NSTs discussed.

## 2019-12-11 NOTE — Patient Instructions (Addendum)
Please take these settings:  basal rate of 1.5 units/hr, except 1.6 units/HR, 9 PM-6 AM.  mealtime bolus of 1 unit/3.1 grams carbohydrate (except please add 4 units to lunch bolus).    correction bolus (which some people call "sensitivity," or "insulin sensitivity ratio," or just "isr") of 1 unit for each 40 by which your glucose exceeds 110.   Please come back for a follow-up appointment in 4 weeks.

## 2019-12-12 DIAGNOSIS — Z3A22 22 weeks gestation of pregnancy: Secondary | ICD-10-CM | POA: Diagnosis not present

## 2019-12-12 DIAGNOSIS — E109 Type 1 diabetes mellitus without complications: Secondary | ICD-10-CM | POA: Diagnosis not present

## 2019-12-12 DIAGNOSIS — O24019 Pre-existing diabetes mellitus, type 1, in pregnancy, unspecified trimester: Secondary | ICD-10-CM | POA: Diagnosis not present

## 2019-12-12 DIAGNOSIS — O24012 Pre-existing diabetes mellitus, type 1, in pregnancy, second trimester: Secondary | ICD-10-CM | POA: Diagnosis not present

## 2019-12-13 ENCOUNTER — Ambulatory Visit (INDEPENDENT_AMBULATORY_CARE_PROVIDER_SITE_OTHER): Payer: BC Managed Care – PPO | Admitting: Licensed Clinical Social Worker

## 2019-12-13 ENCOUNTER — Other Ambulatory Visit: Payer: Self-pay

## 2019-12-13 ENCOUNTER — Encounter: Payer: Self-pay | Admitting: Licensed Clinical Social Worker

## 2019-12-13 DIAGNOSIS — F900 Attention-deficit hyperactivity disorder, predominantly inattentive type: Secondary | ICD-10-CM | POA: Diagnosis not present

## 2019-12-13 DIAGNOSIS — F411 Generalized anxiety disorder: Secondary | ICD-10-CM | POA: Diagnosis not present

## 2019-12-13 NOTE — Progress Notes (Signed)
Virtual Visit via Telephone Note  I connected with Jordan Leonard on 12/13/19 at 11:00 AM EST by telephone and verified that I am speaking with the correct person using two identifiers.   I discussed the limitations, risks, security and privacy concerns of performing an evaluation and management service by telephone and the availability of in person appointments. I also discussed with the patient that there may be a patient responsible charge related to this service. The patient expressed understanding and agreed to proceed.  I discussed the assessment and treatment plan with the patient. The patient was provided an opportunity to ask questions and all were answered. The patient agreed with the plan and demonstrated an understanding of the instructions.   The patient was advised to call back or seek an in-person evaluation if the symptoms worsen or if the condition fails to improve as anticipated.  I provided 53 minutes of non-face-to-face time during this encounter.   Heidi Dach, LCSW    THERAPIST PROGRESS NOTE  Session Time: 1100  Participation Level: Active  Behavioral Response: NeatAlertAnxious  Type of Therapy: Individual Therapy  Treatment Goals addressed: Coping  Interventions: Supportive  Summary: Jordan Leonard is a 29 y.o. female who presents with continued symptoms related to her diagnosis. Jordan Leonard reports doing well since our last session, and noted having a very valuable conversation with her sister. "I feel like our relationship has grown an entire level." Jordan Leonard recounted the conversation she had where her sister dicussed how she realized her mental health issues are rooted in her childhood, and opened up to Jordan Leonard about this. Jordan Leonard reported feeling very validated as she felt a similar experience growing up and has since distanced herself from her parents. LCSW validated Jordan Leonard's feelings and held space for Jordan Leonard to discuss her conversation and  feelings around the event, and offered insight where appropriate. We discussed ways to challenge negative thoughts related to self as they come up, and especially as they relate to childhood. LCSW encouraged Jordan Leonard to continue being open with her sister and recognizing that she got such a wonderful response because of the hard work she's put into mending the relationship. Jordan Leonard expressed understanding and agreement.   Suicidal/Homicidal: No\  Therapist Response: Jordan Leonard continues to work towards her tx goals but has not yet reached them. We will continue to work on improving emotional regulation and distress tolerance skills moving forward.  Plan: Return again in 3 weeks.  Diagnosis: Axis I: Generalized Anxiety Disorder    Axis II: No diagnosis    Heidi Dach, LCSW 12/13/2019

## 2019-12-26 DIAGNOSIS — Z794 Long term (current) use of insulin: Secondary | ICD-10-CM | POA: Diagnosis not present

## 2019-12-26 DIAGNOSIS — E109 Type 1 diabetes mellitus without complications: Secondary | ICD-10-CM | POA: Diagnosis not present

## 2019-12-30 ENCOUNTER — Other Ambulatory Visit: Payer: Self-pay | Admitting: Obstetrics and Gynecology

## 2019-12-30 DIAGNOSIS — O24019 Pre-existing diabetes mellitus, type 1, in pregnancy, unspecified trimester: Secondary | ICD-10-CM

## 2020-01-02 ENCOUNTER — Ambulatory Visit (INDEPENDENT_AMBULATORY_CARE_PROVIDER_SITE_OTHER): Payer: BC Managed Care – PPO | Admitting: Licensed Clinical Social Worker

## 2020-01-02 ENCOUNTER — Other Ambulatory Visit: Payer: Self-pay

## 2020-01-02 ENCOUNTER — Encounter: Payer: Self-pay | Admitting: Licensed Clinical Social Worker

## 2020-01-02 DIAGNOSIS — F411 Generalized anxiety disorder: Secondary | ICD-10-CM

## 2020-01-02 NOTE — Progress Notes (Signed)
Virtual Visit via Video Note  I connected with Jordan Leonard on 01/02/20 at  9:00 AM EDT by a video enabled telemedicine application and verified that I am speaking with the correct person using two identifiers.   I discussed the limitations of evaluation and management by telemedicine and the availability of in person appointments. The patient expressed understanding and agreed to proceed:    I discussed the assessment and treatment plan with the patient. The patient was provided an opportunity to ask questions and all were answered. The patient agreed with the plan and demonstrated an understanding of the instructions.   The patient was advised to call back or seek an in-person evaluation if the symptoms worsen or if the condition fails to improve as anticipated.  I provided 60 minutes of non-face-to-face time during this encounter.   Heidi Dach, LCSW    THERAPIST PROGRESS NOTE  Session Time: 0900  Participation Level: Active  Behavioral Response: CasualAlertAnxious  Type of Therapy: Individual Therapy  Treatment Goals addressed: Coping  Interventions: CBT  Summary: Jordan Leonard is a 29 y.o. female who presents with continued symptoms related to her diagnosis. Jordan Leonard reports doing well since our last session. She reports ongoing conversations with her sister where they are opening up more about their feelings regarding their upbringing and current relationships with their parents. LCSW validated Jordan Leonard's feelings and encouraged her to continue that open dialogue with her sister, and encouraged her to recognize she paved the way for her sister to have these break throughs. Jordan Leonard was able to see this as well and expressed understanding and agreement. LCSW held space for Jordan Leonard to discuss other things going on in her interpersonal relationships, and offered insight where appropriate. We discussed setting boundaries, and how that often feels difficult but is most  frequently needed in situations such as those. Jordan Leonard expressed understanding with this information as well.   Suicidal/Homicidal: No  Therapist Response: Jordan Leonard continues to work towards her tx goals but has not yet reached them. We will continue to work on improving emotional regulation skills and distress tolerance moving forward.   Plan: Return again in 4 weeks.  Diagnosis: Axis I: Generalized Anxiety Disorder    Axis II: No diagnosis    Heidi Dach, LCSW 01/02/2020

## 2020-01-07 ENCOUNTER — Other Ambulatory Visit: Payer: Self-pay

## 2020-01-09 ENCOUNTER — Ambulatory Visit
Admission: RE | Admit: 2020-01-09 | Discharge: 2020-01-09 | Disposition: A | Discharge status: Home / Self Care | Payer: BC Managed Care – PPO | Source: Ambulatory Visit | Attending: Obstetrics and Gynecology | Admitting: Obstetrics and Gynecology

## 2020-01-09 ENCOUNTER — Other Ambulatory Visit: Payer: Self-pay

## 2020-01-09 ENCOUNTER — Encounter: Payer: Self-pay | Admitting: Endocrinology

## 2020-01-09 ENCOUNTER — Ambulatory Visit (HOSPITAL_BASED_OUTPATIENT_CLINIC_OR_DEPARTMENT_OTHER)
Admission: RE | Admit: 2020-01-09 | Discharge: 2020-01-09 | Disposition: A | Discharge status: Home / Self Care | Payer: BC Managed Care – PPO | Source: Ambulatory Visit | Attending: Obstetrics and Gynecology | Admitting: Obstetrics and Gynecology

## 2020-01-09 ENCOUNTER — Ambulatory Visit (INDEPENDENT_AMBULATORY_CARE_PROVIDER_SITE_OTHER): Payer: BC Managed Care – PPO | Admitting: Endocrinology

## 2020-01-09 ENCOUNTER — Encounter: Payer: Self-pay | Admitting: Obstetrics and Gynecology

## 2020-01-09 ENCOUNTER — Ambulatory Visit (INDEPENDENT_AMBULATORY_CARE_PROVIDER_SITE_OTHER): Payer: BC Managed Care – PPO | Admitting: Obstetrics and Gynecology

## 2020-01-09 VITALS — BP 132/78 | HR 110 | Ht 60.0 in | Wt 197.0 lb

## 2020-01-09 VITALS — BP 106/67 | HR 99 | Wt 197.5 lb

## 2020-01-09 DIAGNOSIS — Z98891 History of uterine scar from previous surgery: Secondary | ICD-10-CM

## 2020-01-09 DIAGNOSIS — O24913 Unspecified diabetes mellitus in pregnancy, third trimester: Secondary | ICD-10-CM

## 2020-01-09 DIAGNOSIS — E109 Type 1 diabetes mellitus without complications: Secondary | ICD-10-CM | POA: Diagnosis present

## 2020-01-09 DIAGNOSIS — O0992 Supervision of high risk pregnancy, unspecified, second trimester: Secondary | ICD-10-CM

## 2020-01-09 DIAGNOSIS — Z9641 Presence of insulin pump (external) (internal): Secondary | ICD-10-CM | POA: Diagnosis not present

## 2020-01-09 DIAGNOSIS — Z3A26 26 weeks gestation of pregnancy: Secondary | ICD-10-CM | POA: Diagnosis not present

## 2020-01-09 DIAGNOSIS — F339 Major depressive disorder, recurrent, unspecified: Secondary | ICD-10-CM | POA: Diagnosis not present

## 2020-01-09 DIAGNOSIS — O2612 Low weight gain in pregnancy, second trimester: Secondary | ICD-10-CM

## 2020-01-09 DIAGNOSIS — O24012 Pre-existing diabetes mellitus, type 1, in pregnancy, second trimester: Secondary | ICD-10-CM | POA: Diagnosis not present

## 2020-01-09 DIAGNOSIS — E669 Obesity, unspecified: Secondary | ICD-10-CM

## 2020-01-09 DIAGNOSIS — O365990 Maternal care for other known or suspected poor fetal growth, unspecified trimester, not applicable or unspecified: Secondary | ICD-10-CM

## 2020-01-09 DIAGNOSIS — O24013 Pre-existing diabetes mellitus, type 1, in pregnancy, third trimester: Secondary | ICD-10-CM

## 2020-01-09 DIAGNOSIS — O24019 Pre-existing diabetes mellitus, type 1, in pregnancy, unspecified trimester: Secondary | ICD-10-CM

## 2020-01-09 DIAGNOSIS — R519 Headache, unspecified: Secondary | ICD-10-CM

## 2020-01-09 HISTORY — DX: Maternal care for other known or suspected poor fetal growth, unspecified trimester, not applicable or unspecified: O36.5990

## 2020-01-09 HISTORY — DX: Unspecified diabetes mellitus in pregnancy, third trimester: O24.913

## 2020-01-09 LAB — POCT URINALYSIS DIPSTICK OB
Bilirubin, UA: NEGATIVE
Blood, UA: NEGATIVE
Ketones, UA: NEGATIVE
Leukocytes, UA: NEGATIVE
Nitrite, UA: NEGATIVE
POC,PROTEIN,UA: NEGATIVE
Spec Grav, UA: 1.01 (ref 1.010–1.025)
Urobilinogen, UA: 0.2 E.U./dL
pH, UA: 7 (ref 5.0–8.0)

## 2020-01-09 LAB — POCT GLYCOSYLATED HEMOGLOBIN (HGB A1C): Hemoglobin A1C: 6 % — AB (ref 4.0–5.6)

## 2020-01-09 LAB — GLUCOSE, CAPILLARY: Glucose-Capillary: 163 mg/dL — ABNORMAL HIGH (ref 70–99)

## 2020-01-09 MED ORDER — BUTALBITAL-APAP-CAFFEINE 50-325-40 MG PO CAPS: 1.0000 | ORAL_CAPSULE | Freq: Four times a day (QID) | ORAL | 0 refills | Status: DC | PRN

## 2020-01-09 NOTE — Patient Instructions (Addendum)
Please take these settings:  basal rate of 1.6 units/hr mealtime bolus of 1 unit/2.7 grams carbohydrate (except please add 4 units to lunch bolus).    correction bolus (which some people call "sensitivity," or "insulin sensitivity ratio," or just "isr") of 1 unit for each 25 by which your glucose exceeds 110.   Please come back for a follow-up appointment in 3 weeks.

## 2020-01-09 NOTE — Progress Notes (Signed)
ROB: Patient notes lower back pain, likely due to pregnancy, mostly worse when standing for prolonged periods.  Discussed home treatment measures along with pregnancy girdle. Seen by [facility] today, growth currently measuring 7th percentile, and 2 cm umbilical cord cyst noted (however does note that MFM got new machine and informed her that growth measurements could be a little off). Recommend repeat in 2-3 weeks, along with weekly Dopplers (already scheduled).  Notes that she is up to date on diabetic foot and eye exams, also sees dentist regularly. Sees Endocrinologist today as well. Blood sugars have been ranging 118.  Last A1c was 5.8. Desires refill on Fioricet for headaches, prescription given. RTC in 2 weeks.

## 2020-01-09 NOTE — Progress Notes (Signed)
ROB-Pt present for routine prenatal care. Pt c/o of headaches, side pains and lower back pain.

## 2020-01-09 NOTE — Progress Notes (Signed)
[facility] Consultation   Chief Complaint: Type 1 DM in pregnancy   HPI: [name] is a 29 y.o. G1P0101 at [redacted]w[redacted]d who presents in consultation from Dr Valentino Saxon   for Type 1 DM in pregnancy   See prior note  Diabetes since age 29 , has hypoglycemia awareness, no vascular dz  Followed by Dr Everardo All her endocrinologist She likes her pump and Dexcom.   She reports no foot problems no eye problems and no kidney problems she has her eyes checked annually.  She has a [employer]  in her purse for lows  last a1c 5.8    Prior cesarean with pp HA   Today on scan EFW was at the 7th percentile, dopplers with consistent forward flow 92nd %ile , fluid normal   Past Medical History: Patient  has a past medical history of ADHD, ADHD, Anxiety, COVID-19, Diabetes mellitus type 1 (HCC), Headache, History of cesarean section (09/01/2016), History of chlamydia infection (09/01/2016), and STD (sexually transmitted disease).  Past Surgical History: She  has a past surgical history that includes Cesarean section; Toe Surgery (Right, 2020); and Wisdom tooth extraction (Bilateral).  Obstetric History:  OB History    Gravida  1   Para  1   Term  0   Preterm  1   AB      Living  1     SAB      TAB      Ectopic      Multiple  1   Live Births  1          Gynecologic History:  Patient's last menstrual period was 07/08/2019.   Medications:  Current Outpatient Medications:  .  aspirin EC 81 MG tablet, Take 81 mg by mouth daily., Disp: , Rfl:  .  Butalbital-APAP-Caffeine 50-325-40 MG capsule, Take 1-2 capsules by mouth every 6 (six) hours as needed for headache., Disp: 3 capsule, Rfl: 0 .  Doxylamine-Pyridoxine (DICLEGIS) 10-10 MG TBEC, Take 2 tablets by mouth at bedtime. If symptoms persist, add one tablet in the morning and one in the afternoon, Disp: 100 tablet, Rfl: 5 .  glucagon (GLUCAGON EMERGENCY) 1 MG injection, 1 mg as needed. , Disp: , Rfl:  .  glucose blood  (FREESTYLE PRECISION NEO TEST) test strip, Use to monitor glucose levels 8 times per day; E11.9, Disp: 100 each, Rfl: 12 .  hydrOXYzine (ATARAX/VISTARIL) 25 MG tablet, TAKE 0.5-1 TABLETS (12.5-25 MG TOTAL) BY MOUTH DAILY AS NEEDED. ONLY FOR SEVERE PANIC ATTACKS, Disp: 90 tablet, Rfl: 0 .  insulin aspart (NOVOLOG) 100 UNIT/ML injection, USE VIA INSULIN PUMP. MAX OF 80 UNITS PER DAY., Disp: 90 mL, Rfl: 3 .  Prenatal Vit-Fe Fumarate-FA (PRENATAL MULTIVITAMIN) TABS tablet, Take 1 tablet by mouth daily at 12 noon., Disp: , Rfl:  Allergies: Patient is allergic to amoxicillin and penicillins.  Social History: Patient  reports that she has never smoked. She has never used smokeless tobacco. She reports previous alcohol use. She reports that she does not use drugs.  Family History: family history includes Anxiety disorder in her sister; Asthma in her sister; Depression in her mother and sister; Hypertension in her father and mother; Multiple sclerosis in her sister.  Review of Systems A full 12 point review of systems was negative or as noted in the History of Present Illness.  Physical Exam: LMP 07/08/2019  195 lbs Temp 97.3 135/76 HR 98 RR18 O2 sat 100%  RBS 163   ultrasound today  FGR 7th percentile Breech fetus - noted cord cyst near placental cord insertion - fetus moved independently    Asessement: 1. Type 1 diabetes mellitus without complication ([place])   2. Recurrent major depressive disorder, remission status unspecified ([place])   3. Presence of insulin pump   4. Obesity (BMI 30.0-34.9)   5. Pre-existing type 1 diabetes mellitus during pregnancy in third trimester   6 FGR 7th percentile  7 incidental cord cyst noted  8 h/o cesarean   Plan: 1 commended on good control - glucagon sent in husband knows how to use  2 Weekly bpp and dopplers ordered here,  growth in 3 weeks  3 Twice weekly at 32-34 weeks  4 delivery at 37 weeks    Total time spent with the patient was 30 minutes with  greater than 50% spent in counseling and coordination of care. We appreciate this interesting consult and will be happy to be involved in the ongoing care of [name] in anyway her obstetricians desire.  Dvid Pendry, [facility]

## 2020-01-09 NOTE — Progress Notes (Signed)
Subjective:    Patient ID: Jordan Leonard, female    DOB: [birth year], 29 y.o.   MRN: [id number]  HPI Pt returns for f/u of diabetes mellitus:  DM type: 1 Dx'ed: 2000 Complications: none Therapy: insulin since dx, and pump rx since 2005 (Tandem pump and Dexcom G6 continuous glucose monitor since 2020).   GDM: never.  DKA: only once, at dx Severe hypoglycemia: never.  Pancreatitis: never Pancreatic imaging: never.  Other: she eats meals at 9 AM and 5 PM; she works from home now.   Interval history:  She takes these pump settings:  basal rate of 1.6 units/hr, except 1.6 units/HR, 9 PM-6 AM.  mealtime bolus of 1 unit/3.1 grams carbohydrate (except please add 4 units to lunch bolus).    correction bolus (which some people call "sensitivity," or "insulin sensitivity ratio," or just "isr") of 1 unit for each 25 by which your glucose exceeds 110.  She is [redacted] weeks pregnant.  continuous glucose monitor is downloaded today, and the printout is scanned into the record.  glucose varies from 45-250.  It is in general highest after breakfast and after lunch. TDD is 64 units (48% basal).  Past Medical History:  Diagnosis Date   ADHD    ADHD    Anxiety    COVID-19    Diabetes mellitus type 1 (HCC)    Type 1   Headache    Hormonal Migraines    History of cesarean section 09/01/2016   History of chlamydia infection 09/01/2016   2007   STD (sexually transmitted disease)    chlaymdia- 10 years ago    Past Surgical History:  Procedure Laterality Date   CESAREAN SECTION     TOE SURGERY Right 2020   WISDOM TOOTH EXTRACTION Bilateral    Highschool    Social History   Socioeconomic History   Marital status: Married    Spouse name: Jill Alexanders   Number of children: 1   Years of education: Not on file   Highest education level: Not on file  Occupational History   Not on file  Tobacco Use   Smoking status: Never Smoker   Smokeless tobacco: Never Used  Substance  and Sexual Activity   Alcohol use: Not Currently    Comment: rare   Drug use: No   Sexual activity: Yes  Other Topics Concern   Not on file  Social History Narrative   Married.    1 child.   Works at [employer].   Enjoys being with family, working on mind puzzles   Social Determinants of Corporate investment banker Strain:    Difficulty of Paying Living Expenses:   Food Insecurity:    Worried About Programme researcher, broadcasting/film/video in the Last Year:    Barista in the Last Year:   Transportation Needs:    Freight forwarder (Medical):    Lack of Transportation (Non-Medical):   Physical Activity:    Days of Exercise per Week:    Minutes of Exercise per Session:   Stress:    Feeling of Stress :   Social Connections:    Frequency of Communication with Friends and Family:    Frequency of Social Gatherings with Friends and Family:    Attends Religious Services:    Active Member of Clubs or Organizations:    Attends Banker Meetings:    Marital Status:   Intimate Partner Violence:    Fear of Current or Ex-Partner:  Emotionally Abused:    Physically Abused:    Sexually Abused:     Current Outpatient Medications on File Prior to Visit  Medication Sig Dispense Refill   aspirin EC 81 MG tablet Take 81 mg by mouth daily.     Butalbital-APAP-Caffeine 50-325-40 MG capsule Take 1-2 capsules by mouth every 6 (six) hours as needed for headache. 3 capsule 0   Doxylamine-Pyridoxine (DICLEGIS) 10-10 MG TBEC Take 2 tablets by mouth at bedtime. If symptoms persist, add one tablet in the morning and one in the afternoon 100 tablet 5   glucagon (GLUCAGON EMERGENCY) 1 MG injection 1 mg as needed.      glucose blood (FREESTYLE PRECISION NEO TEST) test strip Use to monitor glucose levels 8 times per day; E11.9 100 each 12   hydrOXYzine (ATARAX/VISTARIL) 25 MG tablet TAKE 0.5-1 TABLETS (12.5-25 MG TOTAL) BY MOUTH DAILY AS NEEDED. ONLY FOR SEVERE  PANIC ATTACKS 90 tablet 0   insulin aspart (NOVOLOG) 100 UNIT/ML injection USE VIA INSULIN PUMP. MAX OF 80 UNITS PER DAY. 90 mL 3   Prenatal Vit-Fe Fumarate-FA (PRENATAL MULTIVITAMIN) TABS tablet Take 1 tablet by mouth daily at 12 noon.     No current facility-administered medications on file prior to visit.    Allergies  Allergen Reactions   Amoxicillin Anaphylaxis   Penicillins Anaphylaxis    Family History  Problem Relation Age of Onset   Asthma Sister    Multiple sclerosis Sister    Anxiety disorder Sister    Depression Sister    Depression Mother    Hypertension Mother    Hypertension Father    Breast cancer Neg Hx    Ovarian cancer Neg Hx    Colon cancer Neg Hx    Diabetes Neg Hx    Heart disease Neg Hx    Diabetes gravidarum Neg Hx    Diabetes type I Neg Hx     BP 132/78    Pulse (!) 110    Ht 5' (1.524 m)    Wt 197 lb (89.4 kg)    LMP 07/08/2019    SpO2 100%    BMI 38.47 kg/m    Review of Systems Denies LOC.  She has gained only 2 lbs this pregnancy    Objective:   Physical Exam VITAL SIGNS:  See vs page GENERAL: no distress Pulses: dorsalis pedis intact bilat.   MSK: no deformity of the feet CV: no leg edema Skin:  no ulcer on the feet.  normal color and temp on the feet. Neuro: sensation is intact to touch on the feet  Lab Results  Component Value Date   HGBA1C 6.0 (A) 01/09/2020   Lab Results  Component Value Date   TSH 3.13 09/19/2019       Assessment & Plan:  Type 1 DM: worse Pregnancy: she needs aggressive glycemic control.   Patient Instructions  Please take these settings:  basal rate of 1.6 units/hr mealtime bolus of 1 unit/2.7 grams carbohydrate (except please add 4 units to lunch bolus).    correction bolus (which some people call "sensitivity," or "insulin sensitivity ratio," or just "isr") of 1 unit for each 25 by which your glucose exceeds 110.   Please come back for a follow-up appointment in 3 weeks.

## 2020-01-09 NOTE — Patient Instructions (Addendum)
Pick up Boost Glucose Control - High Protein. Take 1 a day with meals.     Back Pain in Pregnancy Back pain during pregnancy is common. Back pain may be caused by several factors that are related to changes during your pregnancy. Follow these instructions at home: Managing pain, stiffness, and swelling      If directed, for sudden (acute) back pain, put ice on the painful area. ? Put ice in a plastic bag. ? Place a towel between your skin and the bag. ? Leave the ice on for 20 minutes, 2-3 times per day.  If directed, apply heat to the affected area before you exercise. Use the heat source that your health care provider recommends, such as a moist heat pack or a heating pad. ? Place a towel between your skin and the heat source. ? Leave the heat on for 20-30 minutes. ? Remove the heat if your skin turns bright red. This is especially important if you are unable to feel pain, heat, or cold. You may have a greater risk of getting burned.  If directed, massage the affected area. Activity  Exercise as told by your health care provider. Gentle exercise is the best way to prevent or manage back pain.  Listen to your body when lifting. If lifting hurts, ask for help or bend your knees. This uses your leg muscles instead of your back muscles.  Squat down when picking up something from the floor. Do not bend over.  Only use bed rest for short periods as told by your health care provider. Bed rest should only be used for the most severe episodes of back pain. Standing, sitting, and lying down  Do not stand in one place for long periods of time.  Use good posture when sitting. Make sure your head rests over your shoulders and is not hanging forward. Use a pillow on your lower back if necessary.  Try sleeping on your side, preferably the left side, with a pregnancy support pillow or 1-2 regular pillows between your legs. ? If you have back pain after a night's rest, your bed may be too  soft. ? A firm mattress may provide more support for your back during pregnancy. General instructions  Do not wear high heels.  Eat a healthy diet. Try to gain weight within your health care provider's recommendations.  Use a maternity girdle, elastic sling, or back brace as told by your health care provider.  Take over-the-counter and prescription medicines only as told by your health care provider.  Work with a physical therapist or massage therapist to find ways to manage back pain. Acupuncture or massage therapy may be helpful.  Keep all follow-up visits as told by your health care provider. This is important. Contact a health care provider if:  Your back pain interferes with your daily activities.  You have increasing pain in other parts of your body. Get help right away if:  You develop numbness, tingling, weakness, or problems with the use of your arms or legs.  You develop severe back pain that is not controlled with medicine.  You have a change in bowel or bladder control.  You develop shortness of breath, dizziness, or you faint.  You develop nausea, vomiting, or sweating.  You have back pain that is a rhythmic, cramping pain similar to labor pains. Labor pain is usually 1-2 minutes apart, lasts for about 1 minute, and involves a bearing down feeling or pressure in your pelvis.  You have  back pain and your water breaks or you have vaginal bleeding.  You have back pain or numbness that travels down your leg.  Your back pain developed after you fell.  You develop pain on one side of your back.  You see blood in your urine.  You develop skin blisters in the area of your back pain. Summary  Back pain may be caused by several factors that are related to changes during your pregnancy.  Follow instructions as told by your health care provider for managing pain, stiffness, and swelling.  Exercise as told by your health care provider. Gentle exercise is the best  way to prevent or manage back pain.  Take over-the-counter and prescription medicines only as told by your health care provider.  Keep all follow-up visits as told by your health care provider. This is important. This information is not intended to replace advice given to you by your health care provider. Make sure you discuss any questions you have with your health care provider. Document Revised: 01/08/2019 Document Reviewed: 03/07/2018 Elsevier Patient Education  2020 [employer].

## 2020-01-13 ENCOUNTER — Other Ambulatory Visit: Payer: Self-pay | Admitting: Obstetrics and Gynecology

## 2020-01-13 DIAGNOSIS — O24019 Pre-existing diabetes mellitus, type 1, in pregnancy, unspecified trimester: Secondary | ICD-10-CM

## 2020-01-16 ENCOUNTER — Other Ambulatory Visit: Payer: Self-pay

## 2020-01-16 ENCOUNTER — Ambulatory Visit
Admission: RE | Admit: 2020-01-16 | Discharge: 2020-01-16 | Disposition: A | Discharge status: Home / Self Care | Payer: BC Managed Care – PPO | Source: Ambulatory Visit | Attending: Obstetrics and Gynecology | Admitting: Obstetrics and Gynecology

## 2020-01-16 DIAGNOSIS — O24012 Pre-existing diabetes mellitus, type 1, in pregnancy, second trimester: Secondary | ICD-10-CM | POA: Insufficient documentation

## 2020-01-16 DIAGNOSIS — O365990 Maternal care for other known or suspected poor fetal growth, unspecified trimester, not applicable or unspecified: Secondary | ICD-10-CM | POA: Diagnosis present

## 2020-01-16 DIAGNOSIS — E109 Type 1 diabetes mellitus without complications: Secondary | ICD-10-CM | POA: Diagnosis present

## 2020-01-16 DIAGNOSIS — Z3A27 27 weeks gestation of pregnancy: Secondary | ICD-10-CM | POA: Diagnosis not present

## 2020-01-16 DIAGNOSIS — O24019 Pre-existing diabetes mellitus, type 1, in pregnancy, unspecified trimester: Secondary | ICD-10-CM

## 2020-01-16 LAB — GLUCOSE, CAPILLARY: Glucose-Capillary: 172 mg/dL — ABNORMAL HIGH (ref 70–99)

## 2020-01-20 ENCOUNTER — Other Ambulatory Visit: Payer: Self-pay | Admitting: Obstetrics and Gynecology

## 2020-01-20 DIAGNOSIS — O365990 Maternal care for other known or suspected poor fetal growth, unspecified trimester, not applicable or unspecified: Secondary | ICD-10-CM

## 2020-01-20 DIAGNOSIS — Z8639 Personal history of other endocrine, nutritional and metabolic disease: Secondary | ICD-10-CM

## 2020-01-23 ENCOUNTER — Encounter: Payer: Self-pay | Admitting: Obstetrics and Gynecology

## 2020-01-23 ENCOUNTER — Ambulatory Visit
Admission: RE | Admit: 2020-01-23 | Discharge: 2020-01-23 | Disposition: A | Discharge status: Home / Self Care | Payer: BC Managed Care – PPO | Source: Ambulatory Visit | Attending: Obstetrics and Gynecology | Admitting: Obstetrics and Gynecology

## 2020-01-23 ENCOUNTER — Ambulatory Visit (INDEPENDENT_AMBULATORY_CARE_PROVIDER_SITE_OTHER): Payer: BC Managed Care – PPO | Admitting: Obstetrics and Gynecology

## 2020-01-23 ENCOUNTER — Other Ambulatory Visit: Payer: BC Managed Care – PPO

## 2020-01-23 ENCOUNTER — Other Ambulatory Visit: Payer: Self-pay

## 2020-01-23 VITALS — BP 106/73 | HR 94 | Wt 198.0 lb

## 2020-01-23 DIAGNOSIS — O0992 Supervision of high risk pregnancy, unspecified, second trimester: Secondary | ICD-10-CM

## 2020-01-23 DIAGNOSIS — E109 Type 1 diabetes mellitus without complications: Secondary | ICD-10-CM | POA: Diagnosis not present

## 2020-01-23 DIAGNOSIS — Z3A28 28 weeks gestation of pregnancy: Secondary | ICD-10-CM | POA: Diagnosis not present

## 2020-01-23 DIAGNOSIS — Z794 Long term (current) use of insulin: Secondary | ICD-10-CM | POA: Diagnosis not present

## 2020-01-23 DIAGNOSIS — O365930 Maternal care for other known or suspected poor fetal growth, third trimester, not applicable or unspecified: Secondary | ICD-10-CM | POA: Diagnosis not present

## 2020-01-23 DIAGNOSIS — O24013 Pre-existing diabetes mellitus, type 1, in pregnancy, third trimester: Secondary | ICD-10-CM | POA: Diagnosis not present

## 2020-01-23 DIAGNOSIS — O365990 Maternal care for other known or suspected poor fetal growth, unspecified trimester, not applicable or unspecified: Secondary | ICD-10-CM

## 2020-01-23 DIAGNOSIS — Z8639 Personal history of other endocrine, nutritional and metabolic disease: Secondary | ICD-10-CM

## 2020-01-23 LAB — POCT URINALYSIS DIPSTICK OB
Bilirubin, UA: NEGATIVE
Blood, UA: NEGATIVE
Glucose, UA: NEGATIVE
Ketones, UA: NEGATIVE
Leukocytes, UA: NEGATIVE
Nitrite, UA: NEGATIVE
POC,PROTEIN,UA: NEGATIVE
Spec Grav, UA: 1.01 (ref 1.010–1.025)
Urobilinogen, UA: 0.2 E.U./dL
pH, UA: 7 (ref 5.0–8.0)

## 2020-01-23 LAB — CBC
Hematocrit: 37 % (ref 34.0–46.6)
Hemoglobin: 12.5 g/dL (ref 11.1–15.9)
MCH: 29.5 pg (ref 26.6–33.0)
MCHC: 33.8 g/dL (ref 31.5–35.7)
MCV: 87 fL (ref 79–97)
Platelets: 266 10*3/uL (ref 150–450)
RBC: 4.24 x10E6/uL (ref 3.77–5.28)
RDW: 12.9 % (ref 11.7–15.4)
WBC: 10 10*3/uL (ref 3.4–10.8)

## 2020-01-23 LAB — RPR: RPR Ser Ql: NONREACTIVE

## 2020-01-23 LAB — GLUCOSE, CAPILLARY: Glucose-Capillary: 197 mg/dL — ABNORMAL HIGH (ref 70–99)

## 2020-01-23 MED ORDER — TETANUS-DIPHTH-ACELL PERTUSSIS 5-2.5-18.5 LF-MCG/0.5 IM SUSP
0.5000 mL | Freq: Once | INTRAMUSCULAR | Status: AC
  Administered 2020-01-23: 0.5 mL via INTRAMUSCULAR

## 2020-01-23 NOTE — Progress Notes (Signed)
ROB: Patient continues to be followed at [facility] for growth of 7%, umbilical cord Dopplers with umbilical cord cyst and BPP. She is followed by her endocrinologist for her diabetes/insulin control. Her hemoglobin has slightly climbed to 6.0. She is trying to eat more regularly and managing her carbs better. Patient desires repeat cesarean delivery. Timing of delivery will be dependent upon fetal growth, Dopplers and glycemic control.

## 2020-01-23 NOTE — Addendum Note (Signed)
Addended by: Dorian Pod on: 01/23/2020 03:20 PM   Modules accepted: Orders

## 2020-01-27 ENCOUNTER — Other Ambulatory Visit: Payer: Self-pay | Admitting: Obstetrics and Gynecology

## 2020-01-27 DIAGNOSIS — Z3689 Encounter for other specified antenatal screening: Secondary | ICD-10-CM

## 2020-01-27 DIAGNOSIS — O365990 Maternal care for other known or suspected poor fetal growth, unspecified trimester, not applicable or unspecified: Secondary | ICD-10-CM

## 2020-01-27 DIAGNOSIS — E109 Type 1 diabetes mellitus without complications: Secondary | ICD-10-CM

## 2020-01-30 ENCOUNTER — Ambulatory Visit
Admission: RE | Admit: 2020-01-30 | Discharge: 2020-01-30 | Disposition: A | Discharge status: Home / Self Care | Payer: BC Managed Care – PPO | Source: Ambulatory Visit | Attending: Obstetrics and Gynecology | Admitting: Obstetrics and Gynecology

## 2020-01-30 ENCOUNTER — Other Ambulatory Visit: Payer: Self-pay

## 2020-01-30 DIAGNOSIS — O24013 Pre-existing diabetes mellitus, type 1, in pregnancy, third trimester: Secondary | ICD-10-CM | POA: Insufficient documentation

## 2020-01-30 DIAGNOSIS — Z3A29 29 weeks gestation of pregnancy: Secondary | ICD-10-CM | POA: Insufficient documentation

## 2020-01-30 DIAGNOSIS — Z794 Long term (current) use of insulin: Secondary | ICD-10-CM | POA: Diagnosis not present

## 2020-01-30 DIAGNOSIS — O365930 Maternal care for other known or suspected poor fetal growth, third trimester, not applicable or unspecified: Secondary | ICD-10-CM | POA: Insufficient documentation

## 2020-01-30 DIAGNOSIS — O365990 Maternal care for other known or suspected poor fetal growth, unspecified trimester, not applicable or unspecified: Secondary | ICD-10-CM

## 2020-01-30 DIAGNOSIS — Z3689 Encounter for other specified antenatal screening: Secondary | ICD-10-CM

## 2020-01-30 DIAGNOSIS — E109 Type 1 diabetes mellitus without complications: Secondary | ICD-10-CM | POA: Diagnosis not present

## 2020-01-30 LAB — GLUCOSE, CAPILLARY: Glucose-Capillary: 107 mg/dL — ABNORMAL HIGH (ref 70–99)

## 2020-02-03 ENCOUNTER — Other Ambulatory Visit: Payer: Self-pay

## 2020-02-05 ENCOUNTER — Ambulatory Visit (INDEPENDENT_AMBULATORY_CARE_PROVIDER_SITE_OTHER): Payer: BC Managed Care – PPO | Admitting: Endocrinology

## 2020-02-05 ENCOUNTER — Encounter: Payer: Self-pay | Admitting: Endocrinology

## 2020-02-05 ENCOUNTER — Other Ambulatory Visit: Payer: Self-pay

## 2020-02-05 VITALS — BP 128/70 | HR 105 | Ht 60.0 in | Wt 197.0 lb

## 2020-02-05 DIAGNOSIS — E109 Type 1 diabetes mellitus without complications: Secondary | ICD-10-CM

## 2020-02-05 NOTE — Progress Notes (Signed)
Subjective:    Patient ID: Jordan Leonard, female    DOB: [birth year], 29 y.o.   MRN: [id number]  HPI Pt returns for f/u of diabetes mellitus:  DM type: 1 Dx'ed: 7829 Complications: none Therapy: insulin since dx, and pump rx since 2005 (Tandem pump and Dexcom G6 continuous glucose monitor since 2020).   GDM: never.  DKA: only once, at dx Severe hypoglycemia: never.  Pancreatitis: never Pancreatic imaging: never.  Other: she eats meals at 8 AM, noon, and 6 PM; she works from home now.   Interval history:  She takes these pump settings:  basal rate of 1.7 units/hr. mealtime bolus of 1 unit/2.7 grams carbohydrate (except she adds 4 units to lunch bolus).    correction bolus (which some people call "sensitivity," or "insulin sensitivity ratio," or just "isr") of 1 unit for each 25 by which your glucose exceeds 110.  She is [redacted] weeks pregnant.   continuous glucose monitor is downloaded today, and the printout is scanned into the record.  glucose varies from 40-260.  It is in general highest after breakfast and after lunch. TDD is 70 units (44% basal).  Weight gain so far is just 4 lbs.   Past Medical History:  Diagnosis Date  . ADHD   . ADHD   . Anxiety   . COVID-19   . Diabetes mellitus type 1 (HCC)    Type 1  . Headache    Hormonal Migraines   . History of cesarean section 09/01/2016  . History of chlamydia infection 09/01/2016   2007  . STD (sexually transmitted disease)    chlaymdia- 10 years ago    Past Surgical History:  Procedure Laterality Date  . CESAREAN SECTION    . TOE SURGERY Right 2020  . WISDOM TOOTH EXTRACTION Bilateral    Highschool    Social History   Socioeconomic History  . Marital status: Married    Spouse name: Larkin Ina  . Number of children: 1  . Years of education: Not on file  . Highest education level: Not on file  Occupational History  . Not on file  Tobacco Use  . Smoking status: Never Smoker  . Smokeless tobacco: Never Used    Substance and Sexual Activity  . Alcohol use: Not Currently    Comment: rare  . Drug use: No  . Sexual activity: Yes  Other Topics Concern  . Not on file  Social History Narrative   Married.    1 child.   Works at [employer].   Enjoys being with family, working on mind puzzles   Social Determinants of Radio broadcast assistant Strain:   . Difficulty of Paying Living Expenses:   Food Insecurity:   . Worried About Charity fundraiser in the Last Year:   . Arboriculturist in the Last Year:   Transportation Needs:   . Film/video editor (Medical):   Marland Kitchen Lack of Transportation (Non-Medical):   Physical Activity:   . Days of Exercise per Week:   . Minutes of Exercise per Session:   Stress:   . Feeling of Stress :   Social Connections:   . Frequency of Communication with Friends and Family:   . Frequency of Social Gatherings with Friends and Family:   . Attends Religious Services:   . Active Member of Clubs or Organizations:   . Attends Archivist Meetings:   Marland Kitchen Marital Status:   Intimate Partner Violence:   .  Fear of Current or Ex-Partner:   . Emotionally Abused:   Marland Kitchen Physically Abused:   . Sexually Abused:     Current Outpatient Medications on File Prior to Visit  Medication Sig Dispense Refill  . aspirin EC 81 MG tablet Take 81 mg by mouth daily.    . Butalbital-APAP-Caffeine 50-325-40 MG capsule Take 1-2 capsules by mouth every 6 (six) hours as needed for headache. 3 capsule 0  . Doxylamine-Pyridoxine (DICLEGIS) 10-10 MG TBEC Take 2 tablets by mouth at bedtime. If symptoms persist, add one tablet in the morning and one in the afternoon 100 tablet 5  . glucagon (GLUCAGON EMERGENCY) 1 MG injection 1 mg as needed.     Marland Kitchen glucose blood (FREESTYLE PRECISION NEO TEST) test strip Use to monitor glucose levels 8 times per day; E11.9 100 each 12  . hydrOXYzine (ATARAX/VISTARIL) 25 MG tablet TAKE 0.5-1 TABLETS (12.5-25 MG TOTAL) BY MOUTH DAILY AS NEEDED. ONLY  FOR SEVERE PANIC ATTACKS 90 tablet 0  . insulin aspart (NOVOLOG) 100 UNIT/ML injection USE VIA INSULIN PUMP. MAX OF 80 UNITS PER DAY. 90 mL 3  . Prenatal Vit-Fe Fumarate-FA (PRENATAL MULTIVITAMIN) TABS tablet Take 1 tablet by mouth daily at 12 noon.     No current facility-administered medications on file prior to visit.    Allergies  Allergen Reactions  . Amoxicillin Anaphylaxis  . Penicillins Anaphylaxis    Family History  Problem Relation Age of Onset  . Asthma Sister   . Multiple sclerosis Sister   . Anxiety disorder Sister   . Depression Sister   . Depression Mother   . Hypertension Mother   . Hypertension Father   . Breast cancer Neg Hx   . Ovarian cancer Neg Hx   . Colon cancer Neg Hx   . Diabetes Neg Hx   . Heart disease Neg Hx   . Diabetes gravidarum Neg Hx   . Diabetes type I Neg Hx     BP 128/70   Pulse (!) 105   Ht 5' (1.524 m)   Wt 197 lb (89.4 kg)   LMP 07/08/2019   SpO2 100%   BMI 38.47 kg/m    Review of Systems Denies LOC    Objective:   Physical Exam VITAL SIGNS:  See vs page GENERAL: no distress Pulses: dorsalis pedis intact bilat.   MSK: no deformity of the feet CV: no leg edema Skin:  no ulcer on the feet.  normal color and temp on the feet. Neuro: sensation is intact to touch on the feet  Lab Results  Component Value Date   TSH 3.13 09/19/2019       Assessment & Plan:  Type 1 DM, in pregnancy.  well-controlled Hypoglycemia, due to insulin: this limits aggressiveness of glycemic control   Patient Instructions  Please take these settings:  basal rate of 1.6 units/hr mealtime bolus of 1 unit/2.7 grams carbohydrate (except please add 5 units to lunch bolus).    correction bolus (which some people call "sensitivity," or "insulin sensitivity ratio," or just "isr") of 1 unit for each 25 by which your glucose exceeds 110.   Please come back for a follow-up appointment in 3 weeks.

## 2020-02-05 NOTE — Patient Instructions (Addendum)
Please take these settings:  basal rate of 1.6 units/hr mealtime bolus of 1 unit/2.7 grams carbohydrate (except please add 5 units to lunch bolus).    correction bolus (which some people call "sensitivity," or "insulin sensitivity ratio," or just "isr") of 1 unit for each 25 by which your glucose exceeds 110.   Please come back for a follow-up appointment in 3 weeks.

## 2020-02-06 ENCOUNTER — Ambulatory Visit (INDEPENDENT_AMBULATORY_CARE_PROVIDER_SITE_OTHER): Payer: BC Managed Care – PPO | Admitting: Obstetrics and Gynecology

## 2020-02-06 ENCOUNTER — Encounter: Payer: Self-pay | Admitting: Obstetrics and Gynecology

## 2020-02-06 VITALS — BP 106/73 | HR 99 | Wt 200.3 lb

## 2020-02-06 DIAGNOSIS — E669 Obesity, unspecified: Secondary | ICD-10-CM

## 2020-02-06 DIAGNOSIS — Z3483 Encounter for supervision of other normal pregnancy, third trimester: Secondary | ICD-10-CM

## 2020-02-06 DIAGNOSIS — Z98891 History of uterine scar from previous surgery: Secondary | ICD-10-CM

## 2020-02-06 DIAGNOSIS — E109 Type 1 diabetes mellitus without complications: Secondary | ICD-10-CM

## 2020-02-06 DIAGNOSIS — Z3A30 30 weeks gestation of pregnancy: Secondary | ICD-10-CM

## 2020-02-06 LAB — POCT URINALYSIS DIPSTICK OB
Bilirubin, UA: NEGATIVE
Blood, UA: NEGATIVE
Glucose, UA: NEGATIVE
Leukocytes, UA: NEGATIVE
Nitrite, UA: NEGATIVE
POC,PROTEIN,UA: NEGATIVE
Spec Grav, UA: 1.01 (ref 1.010–1.025)
Urobilinogen, UA: 0.2 E.U./dL
pH, UA: 6.5 (ref 5.0–8.0)

## 2020-02-06 NOTE — Patient Instructions (Addendum)
Common Medications Safe in Pregnancy  Acne:      Constipation:  Benzoyl Peroxide     Colace  Clindamycin      Dulcolax Suppository  Topica Erythromycin     Fibercon  Salicylic Acid      Metamucil         Miralax AVOID:        Senakot   Accutane    Cough:  Retin-A       Cough Drops  Tetracycline      Phenergan w/ Codeine if Rx  Minocycline      Robitussin (Plain & DM)  Antibiotics:     Crabs/Lice:  Ceclor       RID  Cephalosporins    AVOID:  E-Mycins      Kwell  Keflex  Macrobid/Macrodantin   Diarrhea:  Penicillin      Kao-Pectate  Zithromax      Imodium AD         PUSH FLUIDS AVOID:       Cipro     Fever:  Tetracycline      Tylenol (Regular or Extra  Minocycline       Strength)  Levaquin      Extra Strength-Do not          Exceed 8 tabs/24 hrs Caffeine:        <200mg/day (equiv. To 1 cup of coffee or  approx. 3 12 oz sodas)         Gas: Cold/Hayfever:       Gas-X  Benadryl      Mylicon  Claritin       Phazyme  **Claritin-D        Chlor-Trimeton    Headaches:  Dimetapp      ASA-Free Excedrin  Drixoral-Non-Drowsy     Cold Compress  Mucinex (Guaifenasin)     Tylenol (Regular or Extra  Sudafed/Sudafed-12 Hour     Strength)  **Sudafed PE Pseudoephedrine   Tylenol Cold & Sinus     Vicks Vapor Rub  Zyrtec  **AVOID if Problems With Blood Pressure         Heartburn: Avoid lying down for at least 1 hour after meals  Aciphex      Maalox     Rash:  Milk of Magnesia     Benadryl    Mylanta       1% Hydrocortisone Cream  Pepcid  Pepcid Complete   Sleep Aids:  Prevacid      Ambien   Prilosec       Benadryl  Rolaids       Chamomile Tea  Tums (Limit 4/day)     Unisom  Zantac       Tylenol PM         Warm milk-add vanilla or  Hemorrhoids:       Sugar for taste  Anusol/Anusol H.C.  (RX: Analapram 2.5%)  Sugar Substitutes:  Hydrocortisone OTC     Ok in moderation  Preparation H      Tucks        Vaseline lotion applied to tissue with  wiping    Herpes:     Throat:  Acyclovir      Oragel  Famvir  Valtrex     Vaccines:         Flu Shot Leg Cramps:       *Gardasil  Benadryl      Hepatitis A         Hepatitis B Nasal Spray:         Pneumovax  Saline Nasal Spray     Polio Booster         Tetanus Nausea:       Tuberculosis test or PPD  Vitamin B6 25 mg TID   AVOID:    Dramamine      *Gardasil  Emetrol       Live Poliovirus  Ginger Root 250 mg QID    MMR (measles, mumps &  High Complex Carbs @ Bedtime    rebella)  Sea Bands-Accupressure    Varicella (Chickenpox)  Unisom 1/2 tab TID     *No known complications           If received before Pain:         Known pregnancy;   Darvocet       Resume series after  Lortab        Delivery  Percocet    Yeast:   Tramadol      Femstat  Tylenol 3      Gyne-lotrimin  Ultram       Monistat  Vicodin           MISC:         All Sunscreens           Hair Coloring/highlights          Insect Repellant's          (Including DEET)         Mystic Tans      Breastfeeding  Choosing to breastfeed is one of the best decisions you can make for yourself and your baby. A change in hormones during pregnancy causes your breasts to make breast milk in your milk-producing glands. Hormones prevent breast milk from being released before your baby is born. They also prompt milk flow after birth. Once breastfeeding has begun, thoughts of your baby, as well as his or her sucking or crying, can stimulate the release of milk from your milk-producing glands. Benefits of breastfeeding Research shows that breastfeeding offers many health benefits for infants and mothers. It also offers a cost-free and convenient way to feed your baby. For your baby  Your first milk (colostrum) helps your baby's digestive system to function better.  Special cells in your milk (antibodies) help your baby to fight off infections.  Breastfed babies are less likely to develop asthma, allergies, obesity, or type 2  diabetes. They are also at lower risk for sudden infant death syndrome (SIDS).  Nutrients in breast milk are better able to meet your baby's needs compared to infant formula.  Breast milk improves your baby's brain development. For you  Breastfeeding helps to create a very special bond between you and your baby.  Breastfeeding is convenient. Breast milk costs nothing and is always available at the correct temperature.  Breastfeeding helps to burn calories. It helps you to lose the weight that you gained during pregnancy.  Breastfeeding makes your uterus return faster to its size before pregnancy. It also slows bleeding (lochia) after you give birth.  Breastfeeding helps to lower your risk of developing type 2 diabetes, osteoporosis, rheumatoid arthritis, cardiovascular disease, and breast, ovarian, uterine, and endometrial cancer later in life. Breastfeeding basics Starting breastfeeding  Find a comfortable place to sit or lie down, with your neck and back well-supported.  Place a pillow or a rolled-up blanket under your baby to bring him or her to the level of your breast (if you are seated). Nursing pillows are specially designed to help support your arms   breastfeed.  Make sure that your baby's tummy (abdomen) is facing your abdomen.  Gently massage your breast. With your fingertips, massage from the outer edges of your breast inward toward the nipple. This encourages milk flow. If your milk flows slowly, you may need to continue this action during the feeding.  Support your breast with 4 fingers underneath and your thumb above your nipple (make the letter "C" with your hand). Make sure your fingers are well away from your nipple and your baby's mouth.  Stroke your baby's lips gently with your finger or nipple.  When your baby's mouth is open wide enough, quickly bring your baby to your breast, placing your entire nipple and as much of the areola as possible into your baby's  mouth. The areola is the colored area around your nipple. ? More areola should be visible above your baby's upper lip than below the lower lip. ? Your baby's lips should be opened and extended outward (flanged) to ensure an adequate, comfortable latch. ? Your baby's tongue should be between his or her lower gum and your breast.  Make sure that your baby's mouth is correctly positioned around your nipple (latched). Your baby's lips should create a seal on your breast and be turned out (everted).  It is common for your baby to suck about 2-3 minutes in order to start the flow of breast milk. Latching Teaching your baby how to latch onto your breast properly is very important. An improper latch can cause nipple pain, decreased milk supply, and poor weight gain in your baby. Also, if your baby is not latched onto your nipple properly, he or she may swallow some air during feeding. This can make your baby fussy. Burping your baby when you switch breasts during the feeding can help to get rid of the air. However, teaching your baby to latch on properly is still the best way to prevent fussiness from swallowing air while breastfeeding. Signs that your baby has successfully latched onto your nipple  Silent tugging or silent sucking, without causing you pain. Infant's lips should be extended outward (flanged).  Swallowing heard between every 3-4 sucks once your milk has started to flow (after your let-down milk reflex occurs).  Muscle movement above and in front of his or her ears while sucking. Signs that your baby has not successfully latched onto your nipple  Sucking sounds or smacking sounds from your baby while breastfeeding.  Nipple pain. If you think your baby has not latched on correctly, slip your finger into the corner of your baby's mouth to break the suction and place it between your baby's gums. Attempt to start breastfeeding again. Signs of successful breastfeeding Signs from your  baby  Your baby will gradually decrease the number of sucks or will completely stop sucking.  Your baby will fall asleep.  Your baby's body will relax.  Your baby will retain a small amount of milk in his or her mouth.  Your baby will let go of your breast by himself or herself. Signs from you  Breasts that have increased in firmness, weight, and size 1-3 hours after feeding.  Breasts that are softer immediately after breastfeeding.  Increased milk volume, as well as a change in milk consistency and color by the fifth day of breastfeeding.  Nipples that are not sore, cracked, or bleeding. Signs that your baby is getting enough milk  Wetting at least 1-2 diapers during the first 24 hours after birth.  Wetting at least 5-6  diapers every 24 hours for the first week after birth. The urine should be clear or pale yellow by the age of 5 days.  Wetting 6-8 diapers every 24 hours as your baby continues to grow and develop.  At least 3 stools in a 24-hour period by the age of 5 days. The stool should be soft and yellow.  At least 3 stools in a 24-hour period by the age of 7 days. The stool should be seedy and yellow.  No loss of weight greater than 10% of birth weight during the first 3 days of life.  Average weight gain of 4-7 oz (113-198 g) per week after the age of 4 days.  Consistent daily weight gain by the age of 5 days, without weight loss after the age of 2 weeks. After a feeding, your baby may spit up a small amount of milk. This is normal. Breastfeeding frequency and duration Frequent feeding will help you make more milk and can prevent sore nipples and extremely full breasts (breast engorgement). Breastfeed when you feel the need to reduce the fullness of your breasts or when your baby shows signs of hunger. This is called "breastfeeding on demand." Signs that your baby is hungry include:  Increased alertness, activity, or restlessness.  Movement of the head from side to  side.  Opening of the mouth when the corner of the mouth or cheek is stroked (rooting).  Increased sucking sounds, smacking lips, cooing, sighing, or squeaking.  Hand-to-mouth movements and sucking on fingers or hands.  Fussing or crying. Avoid introducing a pacifier to your baby in the first 4-6 weeks after your baby is born. After this time, you may choose to use a pacifier. Research has shown that pacifier use during the first year of a baby's life decreases the risk of sudden infant death syndrome (SIDS). Allow your baby to feed on each breast as long as he or she wants. When your baby unlatches or falls asleep while feeding from the first breast, offer the second breast. Because newborns are often sleepy in the first few weeks of life, you may need to awaken your baby to get him or her to feed. Breastfeeding times will vary from baby to baby. However, the following rules can serve as a guide to help you make sure that your baby is properly fed:  Newborns (babies 69 weeks of age or younger) may breastfeed every 1-3 hours.  Newborns should not go without breastfeeding for longer than 3 hours during the day or 5 hours during the night.  You should breastfeed your baby a minimum of 8 times in a 24-hour period. Breast milk pumping     Pumping and storing breast milk allows you to make sure that your baby is exclusively fed your breast milk, even at times when you are unable to breastfeed. This is especially important if you go back to work while you are still breastfeeding, or if you are not able to be present during feedings. Your lactation consultant can help you find a method of pumping that works best for you and give you guidelines about how long it is safe to store breast milk. Caring for your breasts while you breastfeed Nipples can become dry, cracked, and sore while breastfeeding. The following recommendations can help keep your breasts moisturized and healthy:  Avoid using soap on  your nipples.  Wear a supportive bra designed especially for nursing. Avoid wearing underwire-style bras or extremely tight bras (sports bras).  Air-dry your  nipples for 3-4 minutes after each feeding.  Use only cotton bra pads to absorb leaked breast milk. Leaking of breast milk between feedings is normal.  Use lanolin on your nipples after breastfeeding. Lanolin helps to maintain your skin's normal moisture barrier. Pure lanolin is not harmful (not toxic) to your baby. You may also hand express a few drops of breast milk and gently massage that milk into your nipples and allow the milk to air-dry. In the first few weeks after giving birth, some women experience breast engorgement. Engorgement can make your breasts feel heavy, warm, and tender to the touch. Engorgement peaks within 3-5 days after you give birth. The following recommendations can help to ease engorgement:  Completely empty your breasts while breastfeeding or pumping. You may want to start by applying warm, moist heat (in the shower or with warm, water-soaked hand towels) just before feeding or pumping. This increases circulation and helps the milk flow. If your baby does not completely empty your breasts while breastfeeding, pump any extra milk after he or she is finished.  Apply ice packs to your breasts immediately after breastfeeding or pumping, unless this is too uncomfortable for you. To do this: ? Put ice in a plastic bag. ? Place a towel between your skin and the bag. ? Leave the ice on for 20 minutes, 2-3 times a day.  Make sure that your baby is latched on and positioned properly while breastfeeding. If engorgement persists after 48 hours of following these recommendations, contact your health care provider or a Science writer. Overall health care recommendations while breastfeeding  Eat 3 healthy meals and 3 snacks every day. Well-nourished mothers who are breastfeeding need an additional 450-500 calories a day.  You can meet this requirement by increasing the amount of a balanced diet that you eat.  Drink enough water to keep your urine pale yellow or clear.  Rest often, relax, and continue to take your prenatal vitamins to prevent fatigue, stress, and low vitamin and mineral levels in your body (nutrient deficiencies).  Do not use any products that contain nicotine or tobacco, such as cigarettes and e-cigarettes. Your baby may be harmed by chemicals from cigarettes that pass into breast milk and exposure to secondhand smoke. If you need help quitting, ask your health care provider.  Avoid alcohol.  Do not use illegal drugs or marijuana.  Talk with your health care provider before taking any medicines. These include over-the-counter and prescription medicines as well as vitamins and herbal supplements. Some medicines that may be harmful to your baby can pass through breast milk.  It is possible to become pregnant while breastfeeding. If birth control is desired, ask your health care provider about options that will be safe while breastfeeding your baby. Where to find more information: [employer]: www.llli.org Contact a health care provider if:  You feel like you want to stop breastfeeding or have become frustrated with breastfeeding.  Your nipples are cracked or bleeding.  Your breasts are red, tender, or warm.  You have: ? Painful breasts or nipples. ? A swollen area on either breast. ? A fever or chills. ? Nausea or vomiting. ? Drainage other than breast milk from your nipples.  Your breasts do not become full before feedings by the fifth day after you give birth.  You feel sad and depressed.  Your baby is: ? Too sleepy to eat well. ? Having trouble sleeping. ? More than 45 week old and wetting fewer than 6  wetting fewer than 6 diapers in a 24-hour period. ? Not gaining weight by 5 days of age.  Your baby has fewer than 3 stools in a 24-hour period.  Your baby's  skin or the white parts of his or her eyes become yellow. Get help right away if:  Your baby is overly tired (lethargic) and does not want to wake up and feed.  Your baby develops an unexplained fever. Summary  Breastfeeding offers many health benefits for infant and mothers.  Try to breastfeed your infant when he or she shows early signs of hunger.  Gently tickle or stroke your baby's lips with your finger or nipple to allow the baby to open his or her mouth. Bring the baby to your breast. Make sure that much of the areola is in your baby's mouth. Offer one side and burp the baby before you offer the other side.  Talk with your health care provider or lactation consultant if you have questions or you face problems as you breastfeed. This information is not intended to replace advice given to you by your health care provider. Make sure you discuss any questions you have with your health care provider. Document Revised: 12/14/2017 Document Reviewed: 10/21/2016 Elsevier Patient Education  2020 [employer] Inc[employer]    Breastfeeding Tips for a Good Latch Breastfeeding can be challenging, especially during the first few weeks after childbirth. It is normal to have some problems when you start to breastfeed your new baby, even if you have breastfed before. Latching is an important part of having a good breastfeeding experience. This refers to how your baby's mouth attaches to your nipple to breastfeed. Your baby may have trouble latching due to:  Poor positioning.  Nipple confusion. This can occur if you introduce a bottle or pacifier too early.  Abnormalities in your baby's mouth, tongue, or lips. This includes conditions like tongue-tie or cleft lip.  The shape of your nipples, such as nipples that are flat or turned in (inverted).  Your baby being born early (prematurely). Small babies often have a weak suck. Work with a breastfeeding specialist (lactation consultant) to find positions and  strategies that will help make sure your baby has a good latch. How does this affect me? A poor latch may cause you to have problems such as:  Cracked or sore nipples.  Breasts becoming overfilled with milk (engorgement).  Plugged milk ducts.  Low milk supply.  Breast inflammation or infection. How does this affect my baby? A poor latch may cause your baby to not be able to feed effectively. As a result, he or she may have trouble gaining weight. Follow these instructions at home: How to position your baby  Find a comfortable place to sit or lie down, with your neck and back well supported.  If you are seated, place a pillow or rolled-up blanket under your baby to bring him or her to the level of your breast.  Make sure that your baby's abdomen is facing your abdomen.  Try different positions to find one that works best for you and your baby. How to help your baby latch  To begin each breastfeeding session, gently massage your breast. With your fingertips, massage from your chest wall toward your nipple in a circular motion. This encourages milk flow. If your milk flows slowly, you may need to continue this action during feeding. Position your breast for an ideal latch. Support your breast with four fingers underneath and your thumb above your nipple. Keep   your fingers away from your nipple and your baby's mouth. To help your baby latch, follow these steps: 1. Stroke your baby's lips gently with your finger or nipple. 2. When your baby's mouth is open wide enough, quickly bring your baby to your breast and place your entire nipple, and as much of the areolaas possible, into your baby's mouth. The areola is the colored area around your nipple. 3. Your baby's tongue should be between his or her lower gum and your breast. 4. More areola should be visible above your baby's upper lip than below the lower lip. 5. When your baby starts sucking, you will feel a gentle pull on your nipple,  but you should not feel pain. Be patient. It is common for a baby to suck for about 2-3 minutes to start the flow of breast milk. 6. Make sure that your baby's mouth is correctly positioned around your nipple. Your baby's lips should create a seal on your breast and be turned outward (everted).  General instructions  Look for the following signs that your baby has successfully latched on to your nipple: ? The baby is quietly tugging or quietly sucking without causing you pain. ? You hear the baby swallow after every 3 or 4 sucks. ? You see muscle movement above and in front of the baby's ears while he or she is sucking.  Be aware of these signs that your baby has not successfully latched on to your nipple: ? The baby makes sucking sounds or smacking sounds while nursing. ? You have nipple pain.  If your baby is not latched well, insert your little finger between your baby's gums and your nipple to break the seal. Then try to help your baby latch again. Contact a health care provider if:  You have cracking or soreness in your nipples that lasts longer than 1 week.  You have nipple pain. Nipple cracking and soreness are common during the first week after birth, but nipple pain is never normal.  You have breast engorgement that does not improve after 48-72 hours.  You have a plugged milk duct and a fever.  You follow suggestions for a good latch but you continue to have problems or concerns.  You have pus-like discharge coming from your breast.  Your baby is not gaining weight or loses weight. Summary  Many common breastfeeding challenges are caused by poor latching. Latching refers to how your baby's mouth attaches to your nipple during breastfeeding.  If problems continue, seek help from a lactation consultant in your community. He or she can assess you and your baby for any latching problems. The lactation consultant can work with you to develop a plan to overcome any breastfeeding  challenges. This information is not intended to replace advice given to you by your health care provider. Make sure you discuss any questions you have with your health care provider. Document Revised: 01/09/2019 Document Reviewed: 05/03/2017 Elsevier Patient Education  2020 Elsevier Inc.   

## 2020-02-06 NOTE — Progress Notes (Signed)
Pt present for routine prenatal care. Pt c/o of lower back/abd pain. No other problems.

## 2020-02-06 NOTE — Progress Notes (Signed)
ROB: Doing fairly well. Does feel some mild back and side pain, uses Tylenol, pregnancy band, baths, and position changes which help some. Discussed  Saw Endocrinologist yesterday, HgbA1c up to 6 from 5.8. Sugars are variable depending on what she eats. Saw MFM last week, fetus in 28th %ile. Will schedule C-Section for 03/27/2020. Needs to begin twice weekly NSTs at 32 weeks.   The following were addressed during this visit:  Breastfeeding Education - The importance of exclusive breastfeeding    Comments: Provides antibodies, Lower risk of breast and ovarian cancers, and type-2 diabetes,Helps your body recover, Reduced chance of SIDS.   - Risks of giving your baby anything other than breast milk if you are breastfeeding    Comments: Make the baby less content with breastfeeds, may make my baby more susceptible to illness, and may reduce my milk supply.   - Nonpharmacological pain relief methods for labor    Comments: Deep breathing, focusing on pleasant things, movement and walking, heating pads or cold compress, massage and relaxation, continuous support from someone you trust, and Doulas   - Feeding on demand or baby-led feeding    Comments: Helps prevent breastfeeding complications, helps bring in good milk supply, prevents under or overfeeding, and helps baby feel content and satisfied   - Frequent feeding to help assure optimal milk production    Comments: Making a full supply of milk requires frequent removal of milk from breasts, infant will eat 8-12 times in 24 hours, if separated from infant use breast massage, hand expression and/ or pumping to remove milk from breasts.   - Exclusive breastfeeding for the first 6 months    Comments: Builds a healthy milk supply and keeps it up, protects baby from sickness and disease, and breastmilk has everything your baby needs for the first 6 months.

## 2020-02-10 ENCOUNTER — Other Ambulatory Visit: Payer: Self-pay | Admitting: Maternal & Fetal Medicine

## 2020-02-10 DIAGNOSIS — O24019 Pre-existing diabetes mellitus, type 1, in pregnancy, unspecified trimester: Secondary | ICD-10-CM

## 2020-02-13 ENCOUNTER — Ambulatory Visit
Admission: RE | Admit: 2020-02-13 | Discharge: 2020-02-13 | Disposition: A | Discharge status: Home / Self Care | Payer: BC Managed Care – PPO | Source: Ambulatory Visit | Attending: Obstetrics and Gynecology | Admitting: Obstetrics and Gynecology

## 2020-02-13 ENCOUNTER — Other Ambulatory Visit: Payer: Self-pay

## 2020-02-13 DIAGNOSIS — Z3A31 31 weeks gestation of pregnancy: Secondary | ICD-10-CM | POA: Insufficient documentation

## 2020-02-13 DIAGNOSIS — Z794 Long term (current) use of insulin: Secondary | ICD-10-CM | POA: Diagnosis not present

## 2020-02-13 DIAGNOSIS — E109 Type 1 diabetes mellitus without complications: Secondary | ICD-10-CM | POA: Insufficient documentation

## 2020-02-13 DIAGNOSIS — O24013 Pre-existing diabetes mellitus, type 1, in pregnancy, third trimester: Secondary | ICD-10-CM | POA: Diagnosis not present

## 2020-02-13 DIAGNOSIS — O24019 Pre-existing diabetes mellitus, type 1, in pregnancy, unspecified trimester: Secondary | ICD-10-CM

## 2020-02-13 DIAGNOSIS — O358XX0 Maternal care for other (suspected) fetal abnormality and damage, not applicable or unspecified: Secondary | ICD-10-CM | POA: Diagnosis not present

## 2020-02-13 LAB — GLUCOSE, CAPILLARY: Glucose-Capillary: 113 mg/dL — ABNORMAL HIGH (ref 70–99)

## 2020-02-19 ENCOUNTER — Other Ambulatory Visit: Payer: Self-pay | Admitting: Surgical

## 2020-02-19 MED ORDER — ONDANSETRON 4 MG PO TBDP
4.0000 mg | ORAL_TABLET | Freq: Four times a day (QID) | ORAL | 0 refills | Status: DC | PRN
Start: 2020-02-19 — End: 2020-02-27

## 2020-02-20 ENCOUNTER — Other Ambulatory Visit: Payer: BC Managed Care – PPO

## 2020-02-20 ENCOUNTER — Ambulatory Visit (INDEPENDENT_AMBULATORY_CARE_PROVIDER_SITE_OTHER): Payer: BC Managed Care – PPO | Admitting: Obstetrics and Gynecology

## 2020-02-20 ENCOUNTER — Other Ambulatory Visit: Payer: Self-pay

## 2020-02-20 VITALS — BP 118/78 | HR 99 | Wt 198.9 lb

## 2020-02-20 DIAGNOSIS — Z98891 History of uterine scar from previous surgery: Secondary | ICD-10-CM

## 2020-02-20 DIAGNOSIS — E109 Type 1 diabetes mellitus without complications: Secondary | ICD-10-CM | POA: Diagnosis not present

## 2020-02-20 DIAGNOSIS — Z3A32 32 weeks gestation of pregnancy: Secondary | ICD-10-CM

## 2020-02-20 DIAGNOSIS — Z3483 Encounter for supervision of other normal pregnancy, third trimester: Secondary | ICD-10-CM

## 2020-02-20 DIAGNOSIS — Z0289 Encounter for other administrative examinations: Secondary | ICD-10-CM

## 2020-02-20 NOTE — Progress Notes (Signed)
ROB: Patient has no complaints.  Had her insulin pump adjusted again.  She is seeing MFM for ultrasounds and follow-up growth.  NST today reactive.  Recommend weekly NST for diabetes unless biweekly is required for umbilical cord cyst.

## 2020-02-24 ENCOUNTER — Other Ambulatory Visit: Payer: Self-pay

## 2020-02-24 ENCOUNTER — Other Ambulatory Visit: Payer: Self-pay | Admitting: Maternal & Fetal Medicine

## 2020-02-24 ENCOUNTER — Ambulatory Visit (INDEPENDENT_AMBULATORY_CARE_PROVIDER_SITE_OTHER): Payer: BC Managed Care – PPO | Admitting: Certified Nurse Midwife

## 2020-02-24 ENCOUNTER — Other Ambulatory Visit: Payer: BC Managed Care – PPO

## 2020-02-24 DIAGNOSIS — O24013 Pre-existing diabetes mellitus, type 1, in pregnancy, third trimester: Secondary | ICD-10-CM

## 2020-02-24 DIAGNOSIS — O24019 Pre-existing diabetes mellitus, type 1, in pregnancy, unspecified trimester: Secondary | ICD-10-CM

## 2020-02-24 NOTE — Progress Notes (Signed)
Patient comes in today for NSt. No Problems or concerns today.

## 2020-02-24 NOTE — Patient Instructions (Signed)
Nonstress Test A nonstress test is a procedure that is done during pregnancy in order to check the baby's heartbeat. The procedure can help show if the baby (fetus) is healthy. It is commonly done when:  The baby is past his or her due date.  The pregnancy is high risk.  The baby is moving less than normal.  The mother has lost a pregnancy in the past.  The health care provider suspects a problem with the baby's growth.  There is too much or too little amniotic fluid. The procedure is often done in the third trimester of pregnancy to find out if an early delivery is needed and whether such a delivery is safe. During a nonstress test, the baby's heartbeat is monitored when the baby is resting and when the baby is moving. If the baby is healthy, the heart rate will increase when he or she moves or kicks and will return to normal when he or she rests. Tell a health care provider about:  Any allergies you have.  Any medical conditions you have.  All medicines you are taking, including vitamins, herbs, eye drops, creams, and over-the-counter medicines. What are the risks? There are no risks to you or your baby from a nonstress test. This procedure should not be painful or uncomfortable. What happens before the procedure?  Eat a meal right before the test or as directed by your health care provider. Food may help encourage the baby to move.  Use the restroom right before the test. What happens during the procedure?  Two monitors will be placed on your abdomen. One will record the baby's heart rate and the other will record the contractions of your uterus.  You may be asked to lie down on your side or to sit upright.  You may be given a button to press when you feel your baby move.  Your health care provider will listen to your baby's heartbeat and recorded it. He or she may also watch your baby's heartbeat on a screen.  If the baby seems to be sleeping, you may be asked to drink  some juice or soda, eat a snack, or change positions. The procedure may vary among health care providers and hospitals. What happens after the procedure?  Your health care provider will discuss the test results with you and make recommendations for the future. Depending on the results, your health care provider may order additional tests or another course of action.  If your health care provider gave you any diet or activity instructions, make sure to follow them.  Keep all follow-up visits as told by your health care provider. This is important. Summary  A nonstress test is a procedure that is done during pregnancy in order to check the baby's heartbeat. The procedure can help show if the baby is healthy.  The procedure is often done in the third trimester of pregnancy to find out if an early delivery is needed and whether such a delivery is safe.  During a nonstress test, the baby's heartbeat is monitored when the baby is resting and when the baby is moving. If the baby is healthy, the heart rate will increase when he or she moves or kicks and will return to normal when he or she rests.  Your health care provider will discuss the test results with you and make recommendations for the future. This information is not intended to replace advice given to you by your health care provider. Make sure you discuss any   questions you have with your health care provider. Document Revised: 12/29/2016 Document Reviewed: 12/29/2016 Elsevier Patient Education  2020 [employer]vier Inc[employer]  

## 2020-02-24 NOTE — Progress Notes (Signed)
NST INTERPRETATION:  Indications: diabetes mellitus  basline 140's Moderate variability Accelerations present Declarations absent  Contractions:absent  Impression: reactive   Plan: Follow up as scheduled with MD    Doreene Burke, CNM

## 2020-02-25 ENCOUNTER — Other Ambulatory Visit: Payer: Self-pay

## 2020-02-26 ENCOUNTER — Telehealth: Payer: Self-pay | Admitting: Obstetrics and Gynecology

## 2020-02-26 NOTE — Telephone Encounter (Signed)
Spoke with patient and she is feeling better. She was feeling kind of sluggish before. She has been drinking water and is feeling better now. She will call if she has anymore concerns.

## 2020-02-26 NOTE — Telephone Encounter (Signed)
LM for patient to return call.

## 2020-02-26 NOTE — Telephone Encounter (Signed)
Pt called in and stated she doesn't feel well. She stated no pain no headache. She said she wants to lay down to make her better. The pt is requesting a call back. She sounds claim, but she stated that she wanted to go lay but she said her brain is telling her not to because she may not wake up. The pt is requesting a call back. Please advise

## 2020-02-27 ENCOUNTER — Other Ambulatory Visit: Payer: Self-pay

## 2020-02-27 ENCOUNTER — Encounter: Payer: Self-pay | Admitting: Obstetrics and Gynecology

## 2020-02-27 ENCOUNTER — Encounter: Payer: Self-pay | Admitting: Endocrinology

## 2020-02-27 ENCOUNTER — Ambulatory Visit (INDEPENDENT_AMBULATORY_CARE_PROVIDER_SITE_OTHER): Payer: BC Managed Care – PPO | Admitting: Endocrinology

## 2020-02-27 ENCOUNTER — Ambulatory Visit (INDEPENDENT_AMBULATORY_CARE_PROVIDER_SITE_OTHER): Payer: BC Managed Care – PPO | Admitting: Obstetrics and Gynecology

## 2020-02-27 ENCOUNTER — Ambulatory Visit
Admission: RE | Admit: 2020-02-27 | Discharge: 2020-02-27 | Disposition: A | Discharge status: Home / Self Care | Payer: BC Managed Care – PPO | Source: Ambulatory Visit | Attending: Maternal & Fetal Medicine | Admitting: Maternal & Fetal Medicine

## 2020-02-27 VITALS — BP 112/70 | HR 100 | Ht 60.0 in | Wt 198.0 lb

## 2020-02-27 VITALS — BP 114/74 | HR 105 | Wt 199.8 lb

## 2020-02-27 DIAGNOSIS — E108 Type 1 diabetes mellitus with unspecified complications: Secondary | ICD-10-CM | POA: Diagnosis not present

## 2020-02-27 DIAGNOSIS — E109 Type 1 diabetes mellitus without complications: Secondary | ICD-10-CM

## 2020-02-27 DIAGNOSIS — O24013 Pre-existing diabetes mellitus, type 1, in pregnancy, third trimester: Secondary | ICD-10-CM | POA: Diagnosis not present

## 2020-02-27 DIAGNOSIS — O24019 Pre-existing diabetes mellitus, type 1, in pregnancy, unspecified trimester: Secondary | ICD-10-CM | POA: Diagnosis not present

## 2020-02-27 DIAGNOSIS — O219 Vomiting of pregnancy, unspecified: Secondary | ICD-10-CM

## 2020-02-27 DIAGNOSIS — Z3A33 33 weeks gestation of pregnancy: Secondary | ICD-10-CM | POA: Diagnosis not present

## 2020-02-27 DIAGNOSIS — Z3A00 Weeks of gestation of pregnancy not specified: Secondary | ICD-10-CM | POA: Diagnosis not present

## 2020-02-27 DIAGNOSIS — O0993 Supervision of high risk pregnancy, unspecified, third trimester: Secondary | ICD-10-CM

## 2020-02-27 DIAGNOSIS — Z98891 History of uterine scar from previous surgery: Secondary | ICD-10-CM

## 2020-02-27 DIAGNOSIS — E669 Obesity, unspecified: Secondary | ICD-10-CM

## 2020-02-27 LAB — POCT URINALYSIS DIPSTICK OB
Bilirubin, UA: NEGATIVE
Blood, UA: NEGATIVE
Glucose, UA: NEGATIVE
Nitrite, UA: NEGATIVE
POC,PROTEIN,UA: NEGATIVE
Spec Grav, UA: 1.02 (ref 1.010–1.025)
Urobilinogen, UA: 0.2 E.U./dL
pH, UA: 6 (ref 5.0–8.0)

## 2020-02-27 LAB — POCT GLYCOSYLATED HEMOGLOBIN (HGB A1C): Hemoglobin A1C: 6.3 % — AB (ref 4.0–5.6)

## 2020-02-27 MED ORDER — METOCLOPRAMIDE HCL 10 MG PO TABS
10.0000 mg | ORAL_TABLET | Freq: Four times a day (QID) | ORAL | 1 refills | Status: DC | PRN
Start: 2020-02-27 — End: 2020-03-29

## 2020-02-27 NOTE — Patient Instructions (Addendum)
Please take these settings:  basal rate of 1.7 units/hr, except 1.85 units/HR, 3AM-6AM.   mealtime bolus of 1 unit/2 grams carbohydrate (except please add 5 units to lunch bolus).    correction bolus (which some people call "sensitivity," or "insulin sensitivity ratio," or just "isr") of 1 unit for each 25 by which your glucose exceeds 110.   Please come back for a follow-up appointment in 4 weeks.   When you are in the hospital, you can take a regular diet.

## 2020-02-27 NOTE — Progress Notes (Signed)
ROB-Pt present for routine prenatal care.  Pt stated have some pressure when she walks for long periods of time. Pt also stated still having n/v was given Zofran and now pt stated that she is constipated.

## 2020-02-27 NOTE — Progress Notes (Signed)
Subjective:    Patient ID: Jordan Leonard, female    DOB: 1991-03-25, 29 y.o.   MRN: [id number]  HPI Pt returns for f/u of diabetes mellitus:  DM type: 1 Dx'ed: 5027 Complications: none Therapy: insulin since dx, and pump rx since 2005 (Tandem pump and Dexcom G6 continuous glucose monitor since 2020).   GDM: never.  DKA: only once, at dx Severe hypoglycemia: never.  Pancreatitis: never Pancreatic imaging: never.  Other: she eats meals at 8 AM, noon, and 6 PM; she works from home now.   Interval history:  She takes these pump settings:  basal rate of 1.7 units/hr, except 1.85 units/HR, 3AM-6AM.   mealtime bolus of 1 unit/2.7 grams carbohydrate (except she adds 5 units to lunch bolus).    correction bolus (which some people call "sensitivity," or "insulin sensitivity ratio," or just "isr") of 1 unit for each 25 by which your glucose exceeds 110.  She is [redacted] weeks pregnant.   continuous glucose monitor is downloaded today, and the printout is scanned into the record.  glucose varies from 40-260.  It is in general highest after breakfast and after lunch. TDD is 69 units (51% basal).  Pt says she eats just 2 meals per day.   Past Medical History:  Diagnosis Date  . ADHD   . ADHD   . Anxiety   . COVID-19   . Diabetes mellitus type 1 (HCC)    Type 1  . Headache    Hormonal Migraines   . History of cesarean section 09/01/2016  . History of chlamydia infection 09/01/2016   2007  . STD (sexually transmitted disease)    chlaymdia- 10 years ago    Past Surgical History:  Procedure Laterality Date  . CESAREAN SECTION    . TOE SURGERY Right 2020  . WISDOM TOOTH EXTRACTION Bilateral    Highschool    Social History   Socioeconomic History  . Marital status: Married    Spouse name: Larkin Ina  . Number of children: 1  . Years of education: Not on file  . Highest education level: Not on file  Occupational History  . Not on file  Tobacco Use  . Smoking status: Never Smoker    . Smokeless tobacco: Never Used  Substance and Sexual Activity  . Alcohol use: Not Currently    Comment: rare  . Drug use: No  . Sexual activity: Yes  Other Topics Concern  . Not on file  Social History Narrative   Married.    1 child.   Works at [employer].   Enjoys being with family, working on mind puzzles   Social Determinants of Radio broadcast assistant Strain:   . Difficulty of Paying Living Expenses:   Food Insecurity:   . Worried About Charity fundraiser in the Last Year:   . Arboriculturist in the Last Year:   Transportation Needs:   . Film/video editor (Medical):   Marland Kitchen Lack of Transportation (Non-Medical):   Physical Activity:   . Days of Exercise per Week:   . Minutes of Exercise per Session:   Stress:   . Feeling of Stress :   Social Connections:   . Frequency of Communication with Friends and Family:   . Frequency of Social Gatherings with Friends and Family:   . Attends Religious Services:   . Active Member of Clubs or Organizations:   . Attends Archivist Meetings:   Marland Kitchen Marital Status:  Intimate Partner Violence:   . Fear of Current or Ex-Partner:   . Emotionally Abused:   Marland Kitchen Physically Abused:   . Sexually Abused:     Current Outpatient Medications on File Prior to Visit  Medication Sig Dispense Refill  . aspirin EC 81 MG tablet Take 81 mg by mouth daily.    . Butalbital-APAP-Caffeine 50-325-40 MG capsule Take 1-2 capsules by mouth every 6 (six) hours as needed for headache. 3 capsule 0  . glucagon (GLUCAGON EMERGENCY) 1 MG injection 1 mg as needed.     Marland Kitchen glucose blood (FREESTYLE PRECISION NEO TEST) test strip Use to monitor glucose levels 8 times per day; E11.9 100 each 12  . hydrOXYzine (ATARAX/VISTARIL) 25 MG tablet TAKE 0.5-1 TABLETS (12.5-25 MG TOTAL) BY MOUTH DAILY AS NEEDED. ONLY FOR SEVERE PANIC ATTACKS 90 tablet 0  . insulin aspart (NOVOLOG) 100 UNIT/ML injection USE VIA INSULIN PUMP. MAX OF 80 UNITS PER DAY. 90 mL  3  . Prenatal Vit-Fe Fumarate-FA (PRENATAL MULTIVITAMIN) TABS tablet Take 1 tablet by mouth daily at 12 noon.     No current facility-administered medications on file prior to visit.    Allergies  Allergen Reactions  . Amoxicillin Anaphylaxis  . Penicillins Anaphylaxis    Family History  Problem Relation Age of Onset  . Asthma Sister   . Multiple sclerosis Sister   . Anxiety disorder Sister   . Depression Sister   . Depression Mother   . Hypertension Mother   . Hypertension Father   . Breast cancer Neg Hx   . Ovarian cancer Neg Hx   . Colon cancer Neg Hx   . Diabetes Neg Hx   . Heart disease Neg Hx   . Diabetes gravidarum Neg Hx   . Diabetes type I Neg Hx     BP 112/70   Pulse 100   Ht 5' (1.524 m)   Wt 198 lb (89.8 kg)   LMP 07/08/2019   SpO2 99%   BMI 38.67 kg/m    Review of Systems Denies LOC    Objective:   Physical Exam VITAL SIGNS:  See vs page GENERAL: no distress Pulses: dorsalis pedis intact bilat.   MSK: no deformity of the feet CV: trace bilat leg edema Skin:  no ulcer on the feet.  normal color and temp on the feet. Neuro: sensation is intact to touch on the feet     Lab Results  Component Value Date   HGBA1C 6.3 (A) 02/27/2020       Assessment & Plan:  Type 1 DM, in pregnancy. She needs increased rx Hypoglycemia, due to insulin: this limits aggressiveness of glycemic control   Patient Instructions  Please take these settings:  basal rate of 1.7 units/hr, except 1.85 units/HR, 3AM-6AM.   mealtime bolus of 1 unit/2 grams carbohydrate (except please add 5 units to lunch bolus).    correction bolus (which some people call "sensitivity," or "insulin sensitivity ratio," or just "isr") of 1 unit for each 25 by which your glucose exceeds 110.   Please come back for a follow-up appointment in 4 weeks.   When you are in the hospital, you can take a regular diet.

## 2020-02-27 NOTE — Progress Notes (Signed)
ROB: reports having to change from Diclegis to Zofran as her insurance will no longer cover Dicelgis.  Now is noting Zofran is causing her constipation.  Has not had a BM in 2-3 days.  Has started taking Senna-Kot tablets. Will change to Reglan.  Seen by Endocrinologist today, changed insulin dosing as Hgb was 6.3. Sees [facility] today for growth scan. Continue weekly NSTs, and serial monitoring with [facility]. Has growth scan later today.  RTC in 2 weeks.

## 2020-02-27 NOTE — Patient Instructions (Signed)

## 2020-02-29 DIAGNOSIS — O219 Vomiting of pregnancy, unspecified: Secondary | ICD-10-CM | POA: Insufficient documentation

## 2020-02-29 HISTORY — DX: Vomiting of pregnancy, unspecified: O21.9

## 2020-03-05 ENCOUNTER — Other Ambulatory Visit: Payer: BC Managed Care – PPO

## 2020-03-05 ENCOUNTER — Ambulatory Visit (INDEPENDENT_AMBULATORY_CARE_PROVIDER_SITE_OTHER): Payer: BC Managed Care – PPO | Admitting: Obstetrics and Gynecology

## 2020-03-05 ENCOUNTER — Ambulatory Visit
Admission: RE | Admit: 2020-03-05 | Discharge: 2020-03-05 | Disposition: A | Discharge status: Home / Self Care | Payer: BC Managed Care – PPO | Source: Ambulatory Visit | Attending: Maternal & Fetal Medicine | Admitting: Maternal & Fetal Medicine

## 2020-03-05 ENCOUNTER — Encounter: Payer: Self-pay | Admitting: Obstetrics and Gynecology

## 2020-03-05 ENCOUNTER — Other Ambulatory Visit: Payer: Self-pay

## 2020-03-05 VITALS — BP 110/78 | HR 98 | Wt 201.7 lb

## 2020-03-05 DIAGNOSIS — Z3689 Encounter for other specified antenatal screening: Secondary | ICD-10-CM

## 2020-03-05 DIAGNOSIS — E109 Type 1 diabetes mellitus without complications: Secondary | ICD-10-CM

## 2020-03-05 DIAGNOSIS — O365930 Maternal care for other known or suspected poor fetal growth, third trimester, not applicable or unspecified: Secondary | ICD-10-CM | POA: Diagnosis not present

## 2020-03-05 DIAGNOSIS — Z794 Long term (current) use of insulin: Secondary | ICD-10-CM | POA: Insufficient documentation

## 2020-03-05 DIAGNOSIS — O0993 Supervision of high risk pregnancy, unspecified, third trimester: Secondary | ICD-10-CM

## 2020-03-05 DIAGNOSIS — Z3A34 34 weeks gestation of pregnancy: Secondary | ICD-10-CM | POA: Diagnosis not present

## 2020-03-05 DIAGNOSIS — O24013 Pre-existing diabetes mellitus, type 1, in pregnancy, third trimester: Secondary | ICD-10-CM | POA: Insufficient documentation

## 2020-03-05 DIAGNOSIS — O365990 Maternal care for other known or suspected poor fetal growth, unspecified trimester, not applicable or unspecified: Secondary | ICD-10-CM

## 2020-03-05 DIAGNOSIS — O219 Vomiting of pregnancy, unspecified: Secondary | ICD-10-CM

## 2020-03-05 NOTE — Progress Notes (Signed)
ROB: Patient continues to be followed at [facility] for umbilical cord cyst.  Last growth ultrasound approximately 50th percentile.  Jordan Leonard is recommending fetal surveillance twice weekly.  Have recommended patient have BPP at [facility] every Tuesday and NST and prenatal visit here every Friday.  Patient has increased her insulin and she says her sugars are notedly improved.  She is managing her constipation.  We have discussed ketonuria and patient will attempt to eat several small meals a day and increase her fluid intake.

## 2020-03-06 ENCOUNTER — Other Ambulatory Visit: Payer: Self-pay | Admitting: Maternal & Fetal Medicine

## 2020-03-06 ENCOUNTER — Other Ambulatory Visit: Payer: BC Managed Care – PPO

## 2020-03-06 DIAGNOSIS — O24019 Pre-existing diabetes mellitus, type 1, in pregnancy, unspecified trimester: Secondary | ICD-10-CM

## 2020-03-09 ENCOUNTER — Other Ambulatory Visit: Payer: Self-pay

## 2020-03-09 ENCOUNTER — Ambulatory Visit
Admission: RE | Admit: 2020-03-09 | Discharge: 2020-03-09 | Disposition: A | Discharge status: Home / Self Care | Payer: BC Managed Care – PPO | Source: Ambulatory Visit | Attending: Maternal & Fetal Medicine | Admitting: Maternal & Fetal Medicine

## 2020-03-09 DIAGNOSIS — Z3A35 35 weeks gestation of pregnancy: Secondary | ICD-10-CM | POA: Insufficient documentation

## 2020-03-09 DIAGNOSIS — E109 Type 1 diabetes mellitus without complications: Secondary | ICD-10-CM | POA: Diagnosis not present

## 2020-03-09 DIAGNOSIS — Z3689 Encounter for other specified antenatal screening: Secondary | ICD-10-CM | POA: Diagnosis not present

## 2020-03-09 DIAGNOSIS — O24013 Pre-existing diabetes mellitus, type 1, in pregnancy, third trimester: Secondary | ICD-10-CM | POA: Diagnosis not present

## 2020-03-09 DIAGNOSIS — O24019 Pre-existing diabetes mellitus, type 1, in pregnancy, unspecified trimester: Secondary | ICD-10-CM

## 2020-03-12 ENCOUNTER — Other Ambulatory Visit: Payer: Self-pay | Admitting: Obstetrics and Gynecology

## 2020-03-12 DIAGNOSIS — O24019 Pre-existing diabetes mellitus, type 1, in pregnancy, unspecified trimester: Secondary | ICD-10-CM

## 2020-03-13 ENCOUNTER — Other Ambulatory Visit: Payer: BC Managed Care – PPO

## 2020-03-13 ENCOUNTER — Other Ambulatory Visit: Payer: Self-pay

## 2020-03-13 ENCOUNTER — Encounter: Payer: Self-pay | Admitting: Obstetrics and Gynecology

## 2020-03-13 ENCOUNTER — Ambulatory Visit (INDEPENDENT_AMBULATORY_CARE_PROVIDER_SITE_OTHER): Payer: BC Managed Care – PPO | Admitting: Obstetrics and Gynecology

## 2020-03-13 VITALS — BP 104/73 | HR 106 | Wt 200.4 lb

## 2020-03-13 DIAGNOSIS — O0993 Supervision of high risk pregnancy, unspecified, third trimester: Secondary | ICD-10-CM

## 2020-03-13 DIAGNOSIS — Z3A35 35 weeks gestation of pregnancy: Secondary | ICD-10-CM

## 2020-03-13 NOTE — Progress Notes (Signed)
ROB-Pt present for routine prenatal care and NST. Pt stated that she was doing well no problems.  

## 2020-03-13 NOTE — Progress Notes (Signed)
ROB: Notes constipation has improved, has been able to drink and eat more as early satiety has decreased. Still noting some ketonuria on UA.  NST performed today was reviewed and was found to be reactive.  Continue recommended antenatal testing and prenatal care.  Continues weekly serial Dopplers/growth scans with [facility]. RTC in  1 week.    NONSTRESS TEST INTERPRETATION  INDICATIONS: Obesity, Type 1 DM  FHR baseline: 145 bpm RESULTS:Reactive COMMENTS: 1 contraction (undetected by patient)   PLAN: 1. Continue fetal kick counts twice a day. 2. Continue antepartum testing as scheduled-Biweekly

## 2020-03-16 ENCOUNTER — Other Ambulatory Visit: Payer: Self-pay

## 2020-03-16 ENCOUNTER — Ambulatory Visit
Admission: RE | Admit: 2020-03-16 | Discharge: 2020-03-16 | Disposition: A | Discharge status: Home / Self Care | Payer: BC Managed Care – PPO | Source: Ambulatory Visit | Attending: Obstetrics and Gynecology | Admitting: Obstetrics and Gynecology

## 2020-03-16 ENCOUNTER — Other Ambulatory Visit: Payer: Self-pay | Admitting: Obstetrics and Gynecology

## 2020-03-16 DIAGNOSIS — O24019 Pre-existing diabetes mellitus, type 1, in pregnancy, unspecified trimester: Secondary | ICD-10-CM

## 2020-03-16 DIAGNOSIS — O24113 Pre-existing diabetes mellitus, type 2, in pregnancy, third trimester: Secondary | ICD-10-CM

## 2020-03-16 DIAGNOSIS — E109 Type 1 diabetes mellitus without complications: Secondary | ICD-10-CM | POA: Diagnosis not present

## 2020-03-16 DIAGNOSIS — Z794 Long term (current) use of insulin: Secondary | ICD-10-CM | POA: Diagnosis not present

## 2020-03-16 DIAGNOSIS — O24013 Pre-existing diabetes mellitus, type 1, in pregnancy, third trimester: Secondary | ICD-10-CM | POA: Insufficient documentation

## 2020-03-16 DIAGNOSIS — O34212 Maternal care for vertical scar from previous cesarean delivery: Secondary | ICD-10-CM | POA: Diagnosis not present

## 2020-03-16 DIAGNOSIS — Z3689 Encounter for other specified antenatal screening: Secondary | ICD-10-CM | POA: Diagnosis not present

## 2020-03-16 DIAGNOSIS — Z3A36 36 weeks gestation of pregnancy: Secondary | ICD-10-CM | POA: Diagnosis not present

## 2020-03-16 DIAGNOSIS — Z3A35 35 weeks gestation of pregnancy: Secondary | ICD-10-CM | POA: Diagnosis not present

## 2020-03-16 LAB — GLUCOSE, CAPILLARY: Glucose-Capillary: 125 mg/dL — ABNORMAL HIGH (ref 70–99)

## 2020-03-19 ENCOUNTER — Encounter: Payer: Self-pay | Admitting: Obstetrics and Gynecology

## 2020-03-19 ENCOUNTER — Other Ambulatory Visit: Payer: BC Managed Care – PPO

## 2020-03-19 ENCOUNTER — Other Ambulatory Visit: Payer: Self-pay

## 2020-03-19 ENCOUNTER — Ambulatory Visit (INDEPENDENT_AMBULATORY_CARE_PROVIDER_SITE_OTHER): Payer: BC Managed Care – PPO | Admitting: Obstetrics and Gynecology

## 2020-03-19 VITALS — BP 118/82 | HR 95 | Wt 201.5 lb

## 2020-03-19 DIAGNOSIS — O0993 Supervision of high risk pregnancy, unspecified, third trimester: Secondary | ICD-10-CM

## 2020-03-19 DIAGNOSIS — Z3A36 36 weeks gestation of pregnancy: Secondary | ICD-10-CM

## 2020-03-19 DIAGNOSIS — O24013 Pre-existing diabetes mellitus, type 1, in pregnancy, third trimester: Secondary | ICD-10-CM

## 2020-03-19 NOTE — Progress Notes (Signed)
ROB: Patient without complaints.  NST today reactive.  She reports her Dopplers as going well and normal with 8/8 biophysical profile.  Her sugars are still not well controlled on insulin pump.  She is scheduled for a cesarean delivery next Friday.  MFM who did her ultrasound at [facility] said she had an anterior vertical scar on her uterus.  I rechecked the operative report and this is not the case it is a low transverse scar.  GC/CT-GBS performed today.

## 2020-03-19 NOTE — Addendum Note (Signed)
Addended by: Dorian Pod on: 03/19/2020 10:44 AM   Modules accepted: Orders

## 2020-03-20 ENCOUNTER — Encounter: Payer: BC Managed Care – PPO | Admitting: Obstetrics and Gynecology

## 2020-03-20 ENCOUNTER — Other Ambulatory Visit: Payer: BC Managed Care – PPO

## 2020-03-21 LAB — STREP GP B NAA+RFLX: Strep Gp B NAA+Rflx: NEGATIVE

## 2020-03-22 LAB — GC/CHLAMYDIA PROBE AMP
Chlamydia trachomatis, NAA: NEGATIVE
Neisseria Gonorrhoeae by PCR: NEGATIVE

## 2020-03-23 ENCOUNTER — Ambulatory Visit
Admission: RE | Admit: 2020-03-23 | Discharge: 2020-03-23 | Disposition: A | Discharge status: Home / Self Care | Payer: BC Managed Care – PPO | Source: Ambulatory Visit | Attending: Obstetrics | Admitting: Obstetrics

## 2020-03-23 ENCOUNTER — Other Ambulatory Visit: Payer: Self-pay | Admitting: Obstetrics and Gynecology

## 2020-03-23 ENCOUNTER — Other Ambulatory Visit: Payer: Self-pay

## 2020-03-23 DIAGNOSIS — Z794 Long term (current) use of insulin: Secondary | ICD-10-CM | POA: Insufficient documentation

## 2020-03-23 DIAGNOSIS — Z3A37 37 weeks gestation of pregnancy: Secondary | ICD-10-CM | POA: Insufficient documentation

## 2020-03-23 DIAGNOSIS — O24013 Pre-existing diabetes mellitus, type 1, in pregnancy, third trimester: Secondary | ICD-10-CM

## 2020-03-23 DIAGNOSIS — O24113 Pre-existing diabetes mellitus, type 2, in pregnancy, third trimester: Secondary | ICD-10-CM

## 2020-03-23 LAB — GLUCOSE, CAPILLARY: Glucose-Capillary: 143 mg/dL — ABNORMAL HIGH (ref 70–99)

## 2020-03-24 ENCOUNTER — Encounter: Payer: Self-pay | Admitting: Urgent Care

## 2020-03-24 ENCOUNTER — Other Ambulatory Visit
Admission: RE | Admit: 2020-03-24 | Discharge: 2020-03-24 | Disposition: A | Discharge status: Home / Self Care | Payer: BC Managed Care – PPO | Source: Ambulatory Visit | Attending: Obstetrics and Gynecology | Admitting: Obstetrics and Gynecology

## 2020-03-24 HISTORY — DX: Other specified postprocedural states: Z98.890

## 2020-03-24 HISTORY — DX: Nausea with vomiting, unspecified: R11.2

## 2020-03-24 NOTE — Pre-Procedure Instructions (Signed)
Diabetic coordinator recommended basal rate of pump until AM of surgery and possible use of insulin drip during procedure.  Patient will see her endocrinologist the day before the surgery and obtain pump settings for post partum use. It was recommended to not give the carb. drink prior to the delivery. Secure Chat to Dr. Clayburn Pert to inform him of recommendations.

## 2020-03-24 NOTE — Patient Instructions (Addendum)
Your procedure is scheduled on: 7:15 on 6/25 Report to  [facility] .and call [phone] and an escort will meet you to take you to L&D   Remember: Instructions that are not followed completely may result in serious medical risk,  up to and including death, or upon the discretion of your surgeon and anesthesiologist your  surgery may need to be rescheduled.     _X__ 1. Do not eat food after midnight the night before your procedure.                 No gum chewing or hard candies. You may drink clear liquids up to 2 hours                 before you are scheduled to arrive for your surgery- DO not drink clear                 liquids within 2 hours of the start of your surgery.                 Clear Liquids include:  water,                    _____2.   Complete the carbohydrate drink provided to you, 2 hours before arrival.  __X__2.  On the morning of surgery brush your teeth with toothpaste and water, you                may rinse your mouth with mouthwash if you wish.  Do not swallow any toothpaste of mouthwash.     ___ 3.  No Alcohol for 24 hours before or after surgery.   ___ 4.  Do Not Smoke or use e-cigarettes For 24 Hours Prior to Your Surgery.                 Do not use any chewable tobacco products for at least 6 hours prior to                 Surgery.  __  5.  Do not use any recreational drugs (marijuana, cocaine, heroin, ecstacy, MDMA or other)                For at least one week prior to your surgery.  Combination of these drugs with anesthesia                May have life threatening results.  ____  6.  Bring all medications with you on the day of surgery if instructed.   __x__  7.  Notify your doctor if there is any change in your medical condition      (cold, fever, infections).     Do not wear jewelry, make-up, hairpins, clips or nail polish. Do not wear lotions, powders, or perfumes. You may wear deodorant. Do not shave 48 hours prior to  surgery.  Do not bring valuables to the hospital.    [facility] is not responsible for any belongings or valuables.  Contacts, dentures or bridgework may not be worn into surgery. Leave your suitcase in the car. After surgery it may be brought to your room. For patients admitted to the hospital, discharge time is determined by your treatment team.   Patients discharged the day of surgery will not be allowed to drive home.   Make arrangements for someone to be with you for the first 24 hours of your Same Day Discharge.    Please read over the  following fact sheets that you were given:     __x__ Take these medicines the morning of surgery with A SIP OF WATER:    1. none              2.   3.   4.  5.  6.  ____ Fleet Enema (as directed)   __x__ Use CHG Soap (or wipes) as directed  ____ Use Benzoyl Peroxide Gel as instructed  ____ Use inhalers on the day of surgery  ____ Stop metformin 2 days prior to surgery    __x__ Maintain Basal Rate on your insulin pump.  No bolus AM of surgery.  Will maintain BS with insulin drip or pump per anesthesia.            Obtain pump settings from endocrinologist at preop visit for postpartum period.   ____ Stop Coumadin/Plavix/aspirin on   ____ Stop Anti-inflammatories on    ____ Stop supplements until after surgery.    ____ Bring C-Pap to the hospital.

## 2020-03-24 NOTE — Addendum Note (Signed)
Addended by: Elonda Husky on: 03/24/2020 08:37 AM   Modules accepted: Orders, SmartSet

## 2020-03-24 NOTE — Telephone Encounter (Signed)
This encounter was created in error - please disregard.

## 2020-03-25 ENCOUNTER — Other Ambulatory Visit
Admission: RE | Admit: 2020-03-25 | Discharge: 2020-03-25 | Disposition: A | Discharge status: Home / Self Care | Payer: BC Managed Care – PPO | Source: Ambulatory Visit | Attending: Obstetrics and Gynecology | Admitting: Obstetrics and Gynecology

## 2020-03-25 ENCOUNTER — Other Ambulatory Visit: Payer: Self-pay

## 2020-03-25 DIAGNOSIS — O99344 Other mental disorders complicating childbirth: Secondary | ICD-10-CM | POA: Diagnosis not present

## 2020-03-25 DIAGNOSIS — Z7982 Long term (current) use of aspirin: Secondary | ICD-10-CM | POA: Diagnosis not present

## 2020-03-25 DIAGNOSIS — Z0542 Observation and evaluation of newborn for suspected metabolic condition ruled out: Secondary | ICD-10-CM | POA: Diagnosis not present

## 2020-03-25 DIAGNOSIS — O34219 Maternal care for unspecified type scar from previous cesarean delivery: Secondary | ICD-10-CM | POA: Diagnosis not present

## 2020-03-25 DIAGNOSIS — Z20822 Contact with and (suspected) exposure to covid-19: Secondary | ICD-10-CM | POA: Diagnosis not present

## 2020-03-25 DIAGNOSIS — F339 Major depressive disorder, recurrent, unspecified: Secondary | ICD-10-CM | POA: Diagnosis not present

## 2020-03-25 DIAGNOSIS — E108 Type 1 diabetes mellitus with unspecified complications: Secondary | ICD-10-CM | POA: Diagnosis not present

## 2020-03-25 DIAGNOSIS — Z794 Long term (current) use of insulin: Secondary | ICD-10-CM | POA: Diagnosis not present

## 2020-03-25 DIAGNOSIS — O2402 Pre-existing diabetes mellitus, type 1, in childbirth: Secondary | ICD-10-CM | POA: Diagnosis not present

## 2020-03-25 DIAGNOSIS — E109 Type 1 diabetes mellitus without complications: Secondary | ICD-10-CM | POA: Diagnosis not present

## 2020-03-25 DIAGNOSIS — O24013 Pre-existing diabetes mellitus, type 1, in pregnancy, third trimester: Secondary | ICD-10-CM | POA: Diagnosis not present

## 2020-03-25 DIAGNOSIS — F411 Generalized anxiety disorder: Secondary | ICD-10-CM | POA: Diagnosis not present

## 2020-03-25 DIAGNOSIS — Z3A37 37 weeks gestation of pregnancy: Secondary | ICD-10-CM | POA: Diagnosis not present

## 2020-03-25 DIAGNOSIS — Z880 Allergy status to penicillin: Secondary | ICD-10-CM | POA: Diagnosis not present

## 2020-03-25 DIAGNOSIS — Z9641 Presence of insulin pump (external) (internal): Secondary | ICD-10-CM | POA: Diagnosis not present

## 2020-03-25 DIAGNOSIS — Z01812 Encounter for preprocedural laboratory examination: Secondary | ICD-10-CM | POA: Insufficient documentation

## 2020-03-25 DIAGNOSIS — Z23 Encounter for immunization: Secondary | ICD-10-CM | POA: Diagnosis not present

## 2020-03-25 DIAGNOSIS — G8918 Other acute postprocedural pain: Secondary | ICD-10-CM | POA: Diagnosis not present

## 2020-03-25 DIAGNOSIS — R1084 Generalized abdominal pain: Secondary | ICD-10-CM | POA: Diagnosis not present

## 2020-03-25 DIAGNOSIS — E1065 Type 1 diabetes mellitus with hyperglycemia: Secondary | ICD-10-CM | POA: Diagnosis not present

## 2020-03-25 DIAGNOSIS — O0993 Supervision of high risk pregnancy, unspecified, third trimester: Secondary | ICD-10-CM | POA: Diagnosis not present

## 2020-03-25 DIAGNOSIS — O34211 Maternal care for low transverse scar from previous cesarean delivery: Secondary | ICD-10-CM | POA: Diagnosis not present

## 2020-03-25 DIAGNOSIS — Z833 Family history of diabetes mellitus: Secondary | ICD-10-CM | POA: Diagnosis not present

## 2020-03-25 LAB — CBC
HCT: 38.2 % (ref 36.0–46.0)
Hemoglobin: 13.4 g/dL (ref 12.0–15.0)
MCH: 29.8 pg (ref 26.0–34.0)
MCHC: 35.1 g/dL (ref 30.0–36.0)
MCV: 84.9 fL (ref 80.0–100.0)
Platelets: 253 10*3/uL (ref 150–400)
RBC: 4.5 MIL/uL (ref 3.87–5.11)
RDW: 13.2 % (ref 11.5–15.5)
WBC: 10.1 10*3/uL (ref 4.0–10.5)
nRBC: 0 % (ref 0.0–0.2)

## 2020-03-25 LAB — TYPE AND SCREEN
ABO/RH(D): B POS
Antibody Screen: NEGATIVE
Extend sample reason: UNDETERMINED

## 2020-03-25 LAB — SARS CORONAVIRUS 2 (TAT 6-24 HRS): SARS Coronavirus 2: NEGATIVE

## 2020-03-26 ENCOUNTER — Other Ambulatory Visit: Payer: BC Managed Care – PPO

## 2020-03-26 ENCOUNTER — Ambulatory Visit (INDEPENDENT_AMBULATORY_CARE_PROVIDER_SITE_OTHER): Payer: BC Managed Care – PPO | Admitting: Obstetrics and Gynecology

## 2020-03-26 ENCOUNTER — Encounter: Payer: Self-pay | Admitting: Endocrinology

## 2020-03-26 ENCOUNTER — Encounter: Payer: Self-pay | Admitting: Obstetrics and Gynecology

## 2020-03-26 ENCOUNTER — Ambulatory Visit (INDEPENDENT_AMBULATORY_CARE_PROVIDER_SITE_OTHER): Payer: BC Managed Care – PPO | Admitting: Endocrinology

## 2020-03-26 VITALS — BP 127/79 | HR 105 | Wt 200.3 lb

## 2020-03-26 VITALS — BP 114/70 | HR 107 | Ht 60.5 in | Wt 199.0 lb

## 2020-03-26 DIAGNOSIS — O0993 Supervision of high risk pregnancy, unspecified, third trimester: Secondary | ICD-10-CM

## 2020-03-26 DIAGNOSIS — E109 Type 1 diabetes mellitus without complications: Secondary | ICD-10-CM

## 2020-03-26 DIAGNOSIS — Z3A37 37 weeks gestation of pregnancy: Secondary | ICD-10-CM

## 2020-03-26 LAB — POCT URINALYSIS DIPSTICK OB
Bilirubin, UA: NEGATIVE
Blood, UA: NEGATIVE
Glucose, UA: NEGATIVE
Leukocytes, UA: NEGATIVE
Nitrite, UA: NEGATIVE
Spec Grav, UA: 1.01 (ref 1.010–1.025)
Urobilinogen, UA: 0.2 E.U./dL
pH, UA: 5.5 (ref 5.0–8.0)

## 2020-03-26 LAB — POCT GLYCOSYLATED HEMOGLOBIN (HGB A1C): Hemoglobin A1C: 6 % — AB (ref 4.0–5.6)

## 2020-03-26 MED ORDER — CIPROFLOXACIN IN D5W 400 MG/200ML IV SOLN
400.0000 mg | INTRAVENOUS | Status: AC
  Administered 2020-03-27: 400 mg via INTRAVENOUS
  Filled 2020-03-26: qty 200

## 2020-03-26 MED ORDER — CLINDAMYCIN PHOSPHATE 900 MG/50ML IV SOLN
900.0000 mg | INTRAVENOUS | Status: AC
  Administered 2020-03-27: 900 mg via INTRAVENOUS
  Filled 2020-03-26: qty 50

## 2020-03-26 NOTE — Patient Instructions (Addendum)
Please continue these settings:  basal rate of 1.7 units/hr, except 1.85 units/HR, 3AM-6AM.   mealtime bolus of 1 unit/2 grams carbohydrate (except please add 5 units to lunch bolus).    correction bolus (which some people call "sensitivity," or "insulin sensitivity ratio," or just "isr") of 1 unit for each 25 by which your glucose exceeds 110.   Please come back for a follow-up appointment in 2-3 weeks.   When you go home, you wil probably need something like the settings you took prior to the pregnancy: basal rate of 1.1 units/hr, 6 AM-9PM, and 1.5/HR at other times.   mealtime bolus of 1 unit/5 grams carbohydrate at all times of day.   correction bolus (which some people call "sensitivity," or "insulin sensitivity ratio," or just "isr") of 1 unit for each 50 by which your glucose exceeds 100.   When you are in the hospital, you can take a regular diet.

## 2020-03-26 NOTE — Progress Notes (Signed)
ROB: Patient's most recent hemoglobin A1c was 6.  Scheduled for cesarean delivery tomorrow.  We have discussed this in detail and all of her questions and concerns were addressed and answered.  Patient continues to see endocrinology for insulin pump adjustment.  Pump adjustment immediately postpartum discussed.  Patient has been seeing MFM for umbilical cord cyst and this is reported as stable.  NST today reactive

## 2020-03-26 NOTE — Progress Notes (Signed)
Subjective:    Patient ID: Jordan Leonard, female    DOB: [birth year], 29 y.o.   MRN: [id number]  HPI Pt returns for f/u of diabetes mellitus:  DM type: 1 Dx'ed: 2000 Complications: none Therapy: insulin since dx, and pump rx since 2005 (Tandem pump and Dexcom G6 continuous glucose monitor since 2020).   GDM: never.  DKA: only once, at dx Severe hypoglycemia: never.  Pancreatitis: never Pancreatic imaging: never.  Other: she eats meals at 8 AM, noon, and 6 PM; she works from home now.   Interval history:  She takes these pump settings:  basal rate of 1.7 units/hr, except 1.85 units/HR, 3AM-6AM.   mealtime bolus of 1 unit/2 grams carbohydrate (except adds 5 units to lunch bolus).    correction bolus (which some people call "sensitivity," or "insulin sensitivity ratio," or just "isr") of 1 unit for each 25 by which your glucose exceeds 110.   C-section is sched for tomorrow.   continuous glucose monitor is downloaded today, and the printout is scanned into the record.  glucose varies from 48-240.  There is no trend throughout the day.   TDD is 78 units (38% basal).  Pt says she eats just 2 meals per day.   Past Medical History:  Diagnosis Date  . ADHD   . ADHD   . Anxiety   . COVID-19   . Diabetes mellitus type 1 (HCC)    Type 1  . Headache    Hormonal Migraines   . History of cesarean section 09/01/2016  . History of chlamydia infection 09/01/2016   2007  . PONV (postoperative nausea and vomiting)   . STD (sexually transmitted disease)    chlaymdia- 10 years ago    Past Surgical History:  Procedure Laterality Date  . CESAREAN SECTION    . CESAREAN SECTION Bilateral 03/27/2020   Procedure: CESAREAN SECTION;  Surgeon: Linzie Collin, MD;  Location: [facility] ORS;  Service: Obstetrics;  Laterality: Bilateral;  Repeat C-Section  . TOE SURGERY Right 2020  . WISDOM TOOTH EXTRACTION Bilateral    Highschool    Social History   Socioeconomic History  . Marital status:  Married    Spouse name: Jill Alexanders  . Number of children: 1  . Years of education: Not on file  . Highest education level: Not on file  Occupational History  . Not on file  Tobacco Use  . Smoking status: Never Smoker  . Smokeless tobacco: Never Used  Vaping Use  . Vaping Use: Never used  Substance and Sexual Activity  . Alcohol use: Not Currently    Comment: rare  . Drug use: No  . Sexual activity: Yes  Other Topics Concern  . Not on file  Social History Narrative   Married.    1 child.   Works at [employer].   Enjoys being with family, working on mind puzzles   Social Determinants of Corporate investment banker Strain:   . Difficulty of Paying Living Expenses:   Food Insecurity:   . Worried About Programme researcher, broadcasting/film/video in the Last Year:   . Barista in the Last Year:   Transportation Needs:   . Freight forwarder (Medical):   Marland Kitchen Lack of Transportation (Non-Medical):   Physical Activity:   . Days of Exercise per Week:   . Minutes of Exercise per Session:   Stress:   . Feeling of Stress :   Social Connections:   . Frequency of  Communication with Friends and Family:   . Frequency of Social Gatherings with Friends and Family:   . Attends Religious Services:   . Active Member of Clubs or Organizations:   . Attends Banker Meetings:   Marland Kitchen Marital Status:   Intimate Partner Violence:   . Fear of Current or Ex-Partner:   . Emotionally Abused:   Marland Kitchen Physically Abused:   . Sexually Abused:     Current Facility-Administered Medications on File Prior to Visit  Medication Dose Route Frequency Provider Last Rate Last Admin  . acetaminophen (TYLENOL) tablet 650 mg  650 mg Oral Q4H PRN Linzie Collin, MD   650 mg at 03/28/20 1512  . diphenhydrAMINE (BENADRYL) injection 12.5 mg  12.5 mg Intravenous Q4H PRN Penwarden, Amy, MD       Or  . diphenhydrAMINE (BENADRYL) capsule 25 mg  25 mg Oral Q4H PRN Penwarden, Amy, MD      . diphenhydrAMINE (BENADRYL)  capsule 25 mg  25 mg Oral Q6H PRN Linzie Collin, MD      . ibuprofen (ADVIL) tablet 800 mg  800 mg Oral Q8H Linzie Collin, MD   800 mg at 03/28/20 1744  . insulin pump   Subcutaneous Q4H Linzie Collin, MD   Given at 03/28/20 1740  . lactated ringers infusion   Intravenous Continuous Linzie Collin, MD      . menthol-cetylpyridinium (CEPACOL) lozenge 3 mg  1 lozenge Oral Q2H PRN Linzie Collin, MD      . nalbuphine (NUBAIN) injection 5 mg  5 mg Intravenous Q4H PRN Penwarden, Amy, MD   5 mg at 03/27/20 1343   Or  . nalbuphine (NUBAIN) injection 5 mg  5 mg Subcutaneous Q4H PRN Penwarden, Amy, MD      . nalbuphine (NUBAIN) injection 5 mg  5 mg Intravenous Once PRN Penwarden, Amy, MD       Or  . nalbuphine (NUBAIN) injection 5 mg  5 mg Subcutaneous Once PRN Penwarden, Amy, MD      . naloxone (NARCAN) injection 0.4 mg  0.4 mg Intravenous PRN Penwarden, Amy, MD       And  . sodium chloride flush (NS) 0.9 % injection 3 mL  3 mL Intravenous PRN Penwarden, Amy, MD      . naloxone HCl (NARCAN) 2 mg in dextrose 5 % 250 mL infusion  1-4 mcg/kg/hr Intravenous Continuous PRN Penwarden, Amy, MD      . ondansetron (ZOFRAN) injection 4 mg  4 mg Intravenous Q8H PRN Penwarden, Amy, MD      . oxyCODONE (Oxy IR/ROXICODONE) immediate release tablet 5 mg  5 mg Oral Q4H PRN Penwarden, Amy, MD       Or  . oxyCODONE (Oxy IR/ROXICODONE) immediate release tablet 10 mg  10 mg Oral Q4H PRN Penwarden, Amy, MD      . oxyCODONE-acetaminophen (PERCOCET/ROXICET) 5-325 MG per tablet 1-2 tablet  1-2 tablet Oral Q4H PRN Linzie Collin, MD      . prenatal multivitamin tablet 1 tablet  1 tablet Oral Q1200 Linzie Collin, MD   1 tablet at 03/28/20 1117  . senna-docusate (Senokot-S) tablet 2 tablet  2 tablet Oral Q24H Linzie Collin, MD   2 tablet at 03/28/20 0155  . simethicone (MYLICON) chewable tablet 80 mg  80 mg Oral QID Linzie Collin, MD   80 mg at 03/28/20 1744  . zolpidem (AMBIEN) tablet  5 mg  5 mg Oral QHS PRN  Linzie Collin, MD       Current Outpatient Medications on File Prior to Visit  Medication Sig Dispense Refill  . aspirin EC 81 MG tablet Take 81 mg by mouth daily.    . Butalbital-APAP-Caffeine 50-325-40 MG capsule Take 1-2 capsules by mouth every 6 (six) hours as needed for headache. 3 capsule 0  . glucagon (GLUCAGON EMERGENCY) 1 MG injection Inject 1 mg into the skin once as needed.     Marland Kitchen glucose blood (FREESTYLE PRECISION NEO TEST) test strip Use to monitor glucose levels 8 times per day; E11.9 100 each 12  . hydrOXYzine (ATARAX/VISTARIL) 25 MG tablet TAKE 0.5-1 TABLETS (12.5-25 MG TOTAL) BY MOUTH DAILY AS NEEDED. ONLY FOR SEVERE PANIC ATTACKS 90 tablet 0  . insulin aspart (NOVOLOG) 100 UNIT/ML injection USE VIA INSULIN PUMP. MAX OF 80 UNITS PER DAY. (Patient taking differently: Inject 80 Units into the skin continuous. USE VIA INSULIN PUMP. MAX OF 80 UNITS PER DAY.) 90 mL 3  . metoCLOPramide (REGLAN) 10 MG tablet Take 1 tablet (10 mg total) by mouth every 6 (six) hours as needed for nausea or vomiting. 60 tablet 1  . Prenatal Vit-Fe Fumarate-FA (PRENATAL MULTIVITAMIN) TABS tablet Take 1 tablet by mouth daily at 12 noon.      Allergies  Allergen Reactions  . Amoxicillin Anaphylaxis  . Penicillins Anaphylaxis    Family History  Problem Relation Age of Onset  . Asthma Sister   . Multiple sclerosis Sister   . Anxiety disorder Sister   . Depression Sister   . Depression Mother   . Hypertension Mother   . Hypertension Father   . Breast cancer Neg Hx   . Ovarian cancer Neg Hx   . Colon cancer Neg Hx   . Diabetes Neg Hx   . Heart disease Neg Hx   . Diabetes gravidarum Neg Hx   . Diabetes type I Neg Hx     BP 114/70   Pulse (!) 107   Ht 5' 0.5" (1.537 m)   Wt 199 lb (90.3 kg)   LMP 07/08/2019   SpO2 100%   BMI 38.22 kg/m    Review of Systems Denies LOC.     Objective:   Physical Exam VITAL SIGNS:  See vs page GENERAL: no distress Pulses:  dorsalis pedis intact bilat.   MSK: no deformity of the feet CV: no leg edema Skin:  no ulcer on the feet.  normal color and temp on the feet. Neuro: sensation is intact to touch on the feet  Lab Results  Component Value Date   HGBA1C 6.0 (A) 03/26/2020       Assessment & Plan:  Type 1 DM, in pregnancy. In view of scheduled C-section tomorrow, we'll hold off on making any changes Hypoglycemia, due to insulin: this limits aggressiveness of glycemic control   Patient Instructions  Please continue these settings:  basal rate of 1.7 units/hr, except 1.85 units/HR, 3AM-6AM.   mealtime bolus of 1 unit/2 grams carbohydrate (except please add 5 units to lunch bolus).    correction bolus (which some people call "sensitivity," or "insulin sensitivity ratio," or just "isr") of 1 unit for each 25 by which your glucose exceeds 110.   Please come back for a follow-up appointment in 2-3 weeks.   When you go home, you wil probably need something like the settings you took prior to the pregnancy: basal rate of 1.1 units/hr, 6 AM-9PM, and 1.5/HR at other times.   mealtime bolus of  1 unit/5 grams carbohydrate at all times of day.   correction bolus (which some people call "sensitivity," or "insulin sensitivity ratio," or just "isr") of 1 unit for each 50 by which your glucose exceeds 100.   When you are in the hospital, you can take a regular diet.

## 2020-03-27 ENCOUNTER — Other Ambulatory Visit: Payer: Self-pay

## 2020-03-27 ENCOUNTER — Inpatient Hospital Stay: Payer: BC Managed Care – PPO | Admitting: Urgent Care

## 2020-03-27 ENCOUNTER — Encounter
Admission: RE | Disposition: A | Discharge status: Home / Self Care | Payer: Self-pay | Source: Home / Self Care | Attending: Obstetrics and Gynecology

## 2020-03-27 ENCOUNTER — Inpatient Hospital Stay
Admission: RE | Admit: 2020-03-27 | Discharge: 2020-03-29 | DRG: 786 | Disposition: A | Discharge status: Home / Self Care | Payer: BC Managed Care – PPO | Attending: Obstetrics and Gynecology | Admitting: Obstetrics and Gynecology

## 2020-03-27 ENCOUNTER — Encounter: Payer: Self-pay | Admitting: Obstetrics and Gynecology

## 2020-03-27 DIAGNOSIS — O2402 Pre-existing diabetes mellitus, type 1, in childbirth: Secondary | ICD-10-CM | POA: Diagnosis present

## 2020-03-27 DIAGNOSIS — E1065 Type 1 diabetes mellitus with hyperglycemia: Secondary | ICD-10-CM | POA: Diagnosis present

## 2020-03-27 DIAGNOSIS — Z20822 Contact with and (suspected) exposure to covid-19: Secondary | ICD-10-CM | POA: Diagnosis present

## 2020-03-27 DIAGNOSIS — E109 Type 1 diabetes mellitus without complications: Secondary | ICD-10-CM | POA: Diagnosis not present

## 2020-03-27 DIAGNOSIS — F339 Major depressive disorder, recurrent, unspecified: Secondary | ICD-10-CM | POA: Diagnosis present

## 2020-03-27 DIAGNOSIS — Z880 Allergy status to penicillin: Secondary | ICD-10-CM | POA: Diagnosis not present

## 2020-03-27 DIAGNOSIS — Z794 Long term (current) use of insulin: Secondary | ICD-10-CM

## 2020-03-27 DIAGNOSIS — O34211 Maternal care for low transverse scar from previous cesarean delivery: Principal | ICD-10-CM | POA: Diagnosis present

## 2020-03-27 DIAGNOSIS — Z3A37 37 weeks gestation of pregnancy: Secondary | ICD-10-CM

## 2020-03-27 DIAGNOSIS — O99344 Other mental disorders complicating childbirth: Secondary | ICD-10-CM | POA: Diagnosis present

## 2020-03-27 DIAGNOSIS — F411 Generalized anxiety disorder: Secondary | ICD-10-CM | POA: Diagnosis present

## 2020-03-27 DIAGNOSIS — O24013 Pre-existing diabetes mellitus, type 1, in pregnancy, third trimester: Secondary | ICD-10-CM

## 2020-03-27 DIAGNOSIS — Z7982 Long term (current) use of aspirin: Secondary | ICD-10-CM

## 2020-03-27 DIAGNOSIS — Z9641 Presence of insulin pump (external) (internal): Secondary | ICD-10-CM | POA: Diagnosis present

## 2020-03-27 DIAGNOSIS — Z9889 Other specified postprocedural states: Secondary | ICD-10-CM

## 2020-03-27 DIAGNOSIS — E108 Type 1 diabetes mellitus with unspecified complications: Secondary | ICD-10-CM | POA: Diagnosis not present

## 2020-03-27 LAB — GLUCOSE, CAPILLARY: Glucose-Capillary: 123 mg/dL — ABNORMAL HIGH (ref 70–99)

## 2020-03-27 LAB — ABO/RH: ABO/RH(D): B POS

## 2020-03-27 SURGERY — Surgical Case
Anesthesia: Spinal | Laterality: Bilateral

## 2020-03-27 MED ORDER — NALOXONE HCL 4 MG/10ML IJ SOLN
1.0000 ug/kg/h | INTRAVENOUS | Status: DC | PRN
  Filled 2020-03-27: qty 5

## 2020-03-27 MED ORDER — CHLORHEXIDINE GLUCONATE 0.12 % MT SOLN
15.0000 mL | Freq: Once | OROMUCOSAL | Status: AC
  Administered 2020-03-27: 15 mL via OROMUCOSAL
  Filled 2020-03-27: qty 15

## 2020-03-27 MED ORDER — LIDOCAINE 5 % EX PTCH
MEDICATED_PATCH | CUTANEOUS | Status: DC | PRN
  Administered 2020-03-27: 1 via TRANSDERMAL

## 2020-03-27 MED ORDER — PROPOFOL 10 MG/ML IV BOLUS
INTRAVENOUS | Status: AC
  Filled 2020-03-27: qty 20

## 2020-03-27 MED ORDER — SOD CITRATE-CITRIC ACID 500-334 MG/5ML PO SOLN
ORAL | Status: AC
  Administered 2020-03-27: 30 mL
  Filled 2020-03-27: qty 30

## 2020-03-27 MED ORDER — KETOROLAC TROMETHAMINE 30 MG/ML IJ SOLN: 30.0000 mg | Freq: Four times a day (QID) | INTRAMUSCULAR | Status: AC

## 2020-03-27 MED ORDER — SODIUM CHLORIDE 0.9 % IV SOLN
INTRAVENOUS | Status: DC | PRN
  Administered 2020-03-27: 25 ug/min via INTRAVENOUS

## 2020-03-27 MED ORDER — ACETAMINOPHEN 325 MG PO TABS
650.0000 mg | ORAL_TABLET | Freq: Four times a day (QID) | ORAL | Status: AC
  Administered 2020-03-27 – 2020-03-28 (×4): 650 mg via ORAL
  Filled 2020-03-27 (×5): qty 2

## 2020-03-27 MED ORDER — FENTANYL CITRATE (PF) 100 MCG/2ML IJ SOLN
INTRAMUSCULAR | Status: AC
  Filled 2020-03-27: qty 2

## 2020-03-27 MED ORDER — OXYTOCIN-SODIUM CHLORIDE 30-0.9 UT/500ML-% IV SOLN: INTRAVENOUS | Status: DC | PRN

## 2020-03-27 MED ORDER — ONDANSETRON HCL 4 MG/2ML IJ SOLN
INTRAMUSCULAR | Status: DC | PRN
  Administered 2020-03-27: 4 mg via INTRAVENOUS

## 2020-03-27 MED ORDER — LIDOCAINE 5 % EX PTCH
MEDICATED_PATCH | CUTANEOUS | Status: AC
  Filled 2020-03-27: qty 1

## 2020-03-27 MED ORDER — IBUPROFEN 800 MG PO TABS
800.0000 mg | ORAL_TABLET | Freq: Three times a day (TID) | ORAL | Status: DC
  Filled 2020-03-27: qty 1

## 2020-03-27 MED ORDER — KETOROLAC TROMETHAMINE 30 MG/ML IJ SOLN
30.0000 mg | Freq: Four times a day (QID) | INTRAMUSCULAR | Status: AC
  Administered 2020-03-27 – 2020-03-28 (×4): 30 mg via INTRAVENOUS
  Filled 2020-03-27 (×4): qty 1

## 2020-03-27 MED ORDER — FENTANYL CITRATE (PF) 100 MCG/2ML IJ SOLN
INTRAMUSCULAR | Status: DC | PRN
  Administered 2020-03-27: 15 ug via INTRAVENOUS

## 2020-03-27 MED ORDER — NALBUPHINE HCL 10 MG/ML IJ SOLN: 5.0000 mg | Freq: Once | INTRAMUSCULAR | Status: DC | PRN

## 2020-03-27 MED ORDER — ORAL CARE MOUTH RINSE: 15.0000 mL | Freq: Once | OROMUCOSAL | Status: AC

## 2020-03-27 MED ORDER — MORPHINE SULFATE (PF) 0.5 MG/ML IJ SOLN
INTRAMUSCULAR | Status: DC | PRN
  Administered 2020-03-27: 100 mg via EPIDURAL

## 2020-03-27 MED ORDER — PHENYLEPHRINE HCL (PRESSORS) 10 MG/ML IV SOLN
INTRAVENOUS | Status: DC | PRN
  Administered 2020-03-27 (×2): 100 ug via INTRAVENOUS

## 2020-03-27 MED ORDER — MENTHOL 3 MG MT LOZG
1.0000 | LOZENGE | OROMUCOSAL | Status: DC | PRN
  Filled 2020-03-27: qty 9

## 2020-03-27 MED ORDER — OXYCODONE HCL 5 MG PO TABS
5.0000 mg | ORAL_TABLET | ORAL | Status: DC | PRN
  Administered 2020-03-28: 5 mg via ORAL
  Filled 2020-03-27: qty 1

## 2020-03-27 MED ORDER — LACTATED RINGERS IV SOLN: INTRAVENOUS | Status: DC

## 2020-03-27 MED ORDER — OXYCODONE-ACETAMINOPHEN 5-325 MG PO TABS: 1.0000 | ORAL_TABLET | ORAL | Status: DC | PRN

## 2020-03-27 MED ORDER — NALOXONE HCL 0.4 MG/ML IJ SOLN: 0.4000 mg | INTRAMUSCULAR | Status: DC | PRN

## 2020-03-27 MED ORDER — ONDANSETRON HCL 4 MG/2ML IJ SOLN
INTRAMUSCULAR | Status: AC
  Filled 2020-03-27: qty 2

## 2020-03-27 MED ORDER — NALBUPHINE HCL 10 MG/ML IJ SOLN
5.0000 mg | INTRAMUSCULAR | Status: DC | PRN
  Administered 2020-03-27: 5 mg via INTRAVENOUS
  Filled 2020-03-27: qty 1

## 2020-03-27 MED ORDER — NALBUPHINE HCL 10 MG/ML IJ SOLN: 5.0000 mg | INTRAMUSCULAR | Status: DC | PRN

## 2020-03-27 MED ORDER — BUPIVACAINE IN DEXTROSE 0.75-8.25 % IT SOLN
INTRATHECAL | Status: DC | PRN
  Administered 2020-03-27: 1.6 mL via INTRATHECAL

## 2020-03-27 MED ORDER — OXYTOCIN-SODIUM CHLORIDE 30-0.9 UT/500ML-% IV SOLN
2.5000 [IU]/h | INTRAVENOUS | Status: AC
  Filled 2020-03-27: qty 500

## 2020-03-27 MED ORDER — DIPHENHYDRAMINE HCL 50 MG/ML IJ SOLN: 12.5000 mg | INTRAMUSCULAR | Status: DC | PRN

## 2020-03-27 MED ORDER — PRENATAL MULTIVITAMIN CH
1.0000 | ORAL_TABLET | Freq: Every day | ORAL | Status: DC
  Administered 2020-03-28 – 2020-03-29 (×2): 1 via ORAL
  Filled 2020-03-27 (×2): qty 1

## 2020-03-27 MED ORDER — SENNOSIDES-DOCUSATE SODIUM 8.6-50 MG PO TABS
2.0000 | ORAL_TABLET | ORAL | Status: DC
  Administered 2020-03-28 (×2): 2 via ORAL
  Filled 2020-03-27 (×2): qty 2

## 2020-03-27 MED ORDER — KETOROLAC TROMETHAMINE 30 MG/ML IJ SOLN
INTRAMUSCULAR | Status: AC
  Filled 2020-03-27: qty 1

## 2020-03-27 MED ORDER — SIMETHICONE 80 MG PO CHEW
80.0000 mg | CHEWABLE_TABLET | Freq: Four times a day (QID) | ORAL | Status: DC
  Administered 2020-03-27 – 2020-03-29 (×8): 80 mg via ORAL
  Filled 2020-03-27 (×8): qty 1

## 2020-03-27 MED ORDER — PHENYLEPHRINE 40 MCG/ML (10ML) SYRINGE FOR IV PUSH (FOR BLOOD PRESSURE SUPPORT): PREFILLED_SYRINGE | INTRAVENOUS | Status: DC | PRN

## 2020-03-27 MED ORDER — DIPHENHYDRAMINE HCL 25 MG PO CAPS: 25.0000 mg | ORAL_CAPSULE | Freq: Four times a day (QID) | ORAL | Status: DC | PRN

## 2020-03-27 MED ORDER — MORPHINE SULFATE (PF) 0.5 MG/ML IJ SOLN
INTRAMUSCULAR | Status: AC
  Filled 2020-03-27: qty 10

## 2020-03-27 MED ORDER — KETOROLAC TROMETHAMINE 30 MG/ML IJ SOLN
INTRAMUSCULAR | Status: DC | PRN
  Administered 2020-03-27: 30 mg via INTRAVENOUS

## 2020-03-27 MED ORDER — POVIDONE-IODINE 10 % EX SWAB: 2.0000 "application " | Freq: Once | CUTANEOUS | Status: DC

## 2020-03-27 MED ORDER — OXYCODONE HCL 5 MG PO TABS: 10.0000 mg | ORAL_TABLET | ORAL | Status: DC | PRN

## 2020-03-27 MED ORDER — INSULIN PUMP
SUBCUTANEOUS | Status: DC
  Filled 2020-03-27: qty 1

## 2020-03-27 MED ORDER — ONDANSETRON HCL 4 MG/2ML IJ SOLN: 4.0000 mg | Freq: Three times a day (TID) | INTRAMUSCULAR | Status: DC | PRN

## 2020-03-27 MED ORDER — SODIUM CHLORIDE 0.9% FLUSH: 3.0000 mL | INTRAVENOUS | Status: DC | PRN

## 2020-03-27 MED ORDER — LACTATED RINGERS IV BOLUS
1000.0000 mL | Freq: Once | INTRAVENOUS | Status: AC
  Administered 2020-03-27: 1000 mL via INTRAVENOUS

## 2020-03-27 MED ORDER — DIPHENHYDRAMINE HCL 25 MG PO CAPS: 25.0000 mg | ORAL_CAPSULE | ORAL | Status: DC | PRN

## 2020-03-27 MED ORDER — OXYTOCIN-SODIUM CHLORIDE 30-0.9 UT/500ML-% IV SOLN
INTRAVENOUS | Status: DC | PRN
  Administered 2020-03-27: 600 mL/h via INTRAVENOUS

## 2020-03-27 MED ORDER — OXYTOCIN-SODIUM CHLORIDE 30-0.9 UT/500ML-% IV SOLN
INTRAVENOUS | Status: AC
  Administered 2020-03-27: 2.5 [IU]/h via INTRAVENOUS
  Filled 2020-03-27: qty 1000

## 2020-03-27 MED ORDER — ZOLPIDEM TARTRATE 5 MG PO TABS: 5.0000 mg | ORAL_TABLET | Freq: Every evening | ORAL | Status: DC | PRN

## 2020-03-27 SURGICAL SUPPLY — 27 items
ADHESIVE MASTISOL STRL (MISCELLANEOUS) ×2 IMPLANT
BAG COUNTER SPONGE EZ (MISCELLANEOUS) ×2 IMPLANT
CANISTER SUCT 3000ML PPV (MISCELLANEOUS) ×2 IMPLANT
CHLORAPREP W/TINT 26 (MISCELLANEOUS) ×4 IMPLANT
CLSR STERI-STRIP ANTIMIC 1/2X4 (GAUZE/BANDAGES/DRESSINGS) ×2 IMPLANT
COVER WAND RF STERILE (DRAPES) ×2 IMPLANT
DRSG TELFA 3X8 NADH (GAUZE/BANDAGES/DRESSINGS) ×2 IMPLANT
GAUZE SPONGE 4X4 12PLY STRL (GAUZE/BANDAGES/DRESSINGS) ×2 IMPLANT
GLOVE BIOGEL PI ORTHO PRO 7.5 (GLOVE) ×1
GLOVE PI ORTHO PRO STRL 7.5 (GLOVE) ×1 IMPLANT
GOWN STRL REUS W/ TWL LRG LVL3 (GOWN DISPOSABLE) ×2 IMPLANT
GOWN STRL REUS W/TWL LRG LVL3 (GOWN DISPOSABLE) ×2
KIT TURNOVER KIT A (KITS) ×2 IMPLANT
NS IRRIG 1000ML POUR BTL (IV SOLUTION) ×2 IMPLANT
PACK C SECTION (MISCELLANEOUS) ×2 IMPLANT
PAD OB MATERNITY 4.3X12.25 (PERSONAL CARE ITEMS) ×2 IMPLANT
PAD PREP 24X41 OB/GYN DISP (PERSONAL CARE ITEMS) ×2 IMPLANT
PENCIL SMOKE ULTRAEVAC 22 CON (MISCELLANEOUS) ×2 IMPLANT
RETRACTOR WND ALEXIS-O 25 LRG (MISCELLANEOUS) ×1 IMPLANT
RTRCTR WOUND ALEXIS O 25CM LRG (MISCELLANEOUS) ×2
SPONGE GAUZE 4X4 12PLY STER LF (GAUZE/BANDAGES/DRESSINGS) ×2 IMPLANT
SPONGE LAP 18X18 RF (DISPOSABLE) ×2 IMPLANT
SUT VIC AB 0 CTX 36 (SUTURE) ×2
SUT VIC AB 0 CTX36XBRD ANBCTRL (SUTURE) ×2 IMPLANT
SUT VIC AB 1 CT1 36 (SUTURE) ×4 IMPLANT
SUT VICRYL+ 3-0 36IN CT-1 (SUTURE) ×4 IMPLANT
TAPE MEDIFIX FOAM 3 (GAUZE/BANDAGES/DRESSINGS) ×2 IMPLANT

## 2020-03-27 NOTE — Progress Notes (Signed)
Patient arrived to L&D recovery.  Patient reattached personal insulin pump.

## 2020-03-27 NOTE — Anesthesia Preprocedure Evaluation (Signed)
Anesthesia Evaluation  Patient identified by MRN, date of birth, ID band Patient awake    Reviewed: Allergy & Precautions, NPO status , Patient's Chart, lab work & pertinent test results  History of Anesthesia Complications (+) PONV and history of anesthetic complications  Airway Mallampati: III  TM Distance: >3 FB Neck ROM: Full    Dental no notable dental hx.    Pulmonary neg sleep apnea, neg COPD,    breath sounds clear to auscultation- rhonchi (-) wheezing      Cardiovascular Exercise Tolerance: Good (-) hypertension(-) CAD, (-) Past MI, (-) Cardiac Stents and (-) CABG  Rhythm:Regular Rate:Normal - Systolic murmurs and - Diastolic murmurs    Neuro/Psych  Headaches, neg Seizures PSYCHIATRIC DISORDERS Anxiety Depression    GI/Hepatic negative GI ROS, Neg liver ROS,   Endo/Other  diabetes, Type 1  Renal/GU negative Renal ROS     Musculoskeletal negative musculoskeletal ROS (+)   Abdominal (+) + obese,   Peds  Hematology negative hematology ROS (+)   Anesthesia Other Findings Past Medical History: No date: ADHD No date: ADHD No date: Anxiety No date: COVID-19 No date: Diabetes mellitus type 1 (HCC)     Comment:  Type 1 No date: Headache     Comment:  Hormonal Migraines  09/01/2016: History of cesarean section 09/01/2016: History of chlamydia infection     Comment:  2007 No date: PONV (postoperative nausea and vomiting) No date: STD (sexually transmitted disease)     Comment:  chlaymdia- 10 years ago   Reproductive/Obstetrics (+) Pregnancy                             Lab Results  Component Value Date   WBC 10.1 03/25/2020   HGB 13.4 03/25/2020   HCT 38.2 03/25/2020   MCV 84.9 03/25/2020   PLT 253 03/25/2020    Anesthesia Physical Anesthesia Plan  ASA: II  Anesthesia Plan: Spinal   Post-op Pain Management:    Induction:   PONV Risk Score and Plan: 3 and Ondansetron  and Treatment may vary due to age or medical condition  Airway Management Planned: Natural Airway  Additional Equipment:   Intra-op Plan:   Post-operative Plan:   Informed Consent: I have reviewed the patients History and Physical, chart, labs and discussed the procedure including the risks, benefits and alternatives for the proposed anesthesia with the patient or authorized representative who has indicated his/her understanding and acceptance.     Dental advisory given  Plan Discussed with: CRNA and Anesthesiologist  Anesthesia Plan Comments:         Anesthesia Quick Evaluation

## 2020-03-27 NOTE — Progress Notes (Signed)
In pre op RN discussed with MD and anesthesia that patient has her insulin pump on. Pump will be removed for case but port site will remain in place during procedure. Patient reports there is no metal in port site. These sites were covered with tegaderm. RN obtained insulin pump contract consent. This was communicated with team during procedure time out.

## 2020-03-27 NOTE — Op Note (Signed)
      OP NOTE  Date: 03/27/2020   9:23 AM Name Jordan Leonard MR# [id number]  Preoperative Diagnosis: 1. Intrauterine pregnancy at [redacted]w[redacted]d 2. Poorly controlled IDDM 3.  Umbilical cord cyst followed by MFM  Postoperative Diagnosis: 1. Intrauterine pregnancy at [redacted]w[redacted]d, delivered 2. Viable infant 3. Remainder same as pre-op   Procedure: 1.  Repeat Low-Transverse Cesarean Section  Surgeon: Elonda Husky, MD  Assistant:    Anesthesia: Spinal    EBL: 693  ml     Findings: 1) female infant, Apgar scores of 9   at 1 minute and 9   at 5 minutes and a birthweight of 104.76  ounces.    2) Normal uterus, tubes and ovaries.    Procedure:  The patient was prepped and draped in the supine position and placed under spinal anesthesia.  A transverse incision was made across the abdomen in a Pfannenstiel manner. If indicated the old scar was systematically removed with sharp dissection.  We carried the dissection down to the level of the fascia.  The fascia was incised in a curvilinear manner.  The fascia was then elevated from the rectus muscles with blunt and sharp dissection.  The rectus muscles were separated laterally exposing the peritoneum.  The peritoneum was carefully entered with care being taken to avoid bowel and bladder.  A self-retaining retractor was placed.  The visceral peritoneum was incised in a curvilinear fashion across the lower uterine segment creating a bladder flap. A transverse incision was made across the lower uterine segment and extended laterally and superiorly using the bandage scissors.  Artificial rupture membranes was performed and Clear fluid was noted.  The infant was delivered from the cephalic position.  A nuchal cord was present. After an appropriate time interval, the cord was doubly clamped and cut. Cord blood was obtained if required.  The infant was handed to the pediatric personnel  who then placed the infant under heat lamps where it was cleaned dried  and suctioned as needed. The placenta was delivered. The hysterotomy incision was then identified on ring forceps.  The uterine cavity was cleaned with a moist lap sponge.  The hysterotomy incision was closed with a running interlocking suture of Vicryl.  Hemostasis was excellent.  Pitocin was run in the IV and the uterus was found to be firm. The posterior cul-de-sac and gutters were cleaned and inspected.  Hemostasis was noted.  The fascia was then closed with a running suture of #1 Vicryl.  Hemostasis of the subcutaneous tissues was obtained using the Bovie.  The subcutaneous tissues were closed with a running suture of 000 Vicryl.  A subcuticular suture was placed.  Steri-Strips were applied in the usual manner.  A pressure dressing was placed.  The patient went to the recovery room in stable condition.   Elonda Husky, M.D. 03/27/2020 9:23 AM

## 2020-03-27 NOTE — H&P (Signed)
History and Physical   HPI  Jordan Leonard is a 29 y.o. G2P0101 at [redacted]w[redacted]d Estimated Date of Delivery: 04/13/20 who is being admitted for C-section repeat for poorly controlled IDDM and umbilical cord cyst.   OB History  OB History  Gravida Para Term Preterm AB Living  2 1 0 1 0 1  SAB TAB Ectopic Multiple Live Births  0 0 0 1 1    # Outcome Date GA Lbr Len/2nd Weight Sex Delivery Anes PTL Lv  2A Preterm 05/23/14   3317 g F CS-Classical   LIV  2B Current           1 Gravida             PROBLEM LIST  Pregnancy complications or risks: Patient Active Problem List   Diagnosis Date Noted  . Nausea and vomiting during pregnancy 02/29/2020  . Diabetes mellitus in pregnancy in third trimester 01/09/2020  . Pregnancy affected by fetal growth restriction 01/09/2020  . Panic attacks 08/19/2019  . Attention deficit hyperactivity disorder (ADHD), predominantly inattentive type 07/10/2019  . GAD (generalized anxiety disorder) 03/05/2019  . Recurrent major depressive disorder ([place]) 03/05/2019  . Toe pain, left 08/15/2018  . Breast cyst, right 03/07/2017  . History of cesarean delivery 09/01/2016  . Obesity (BMI 30.0-34.9) 09/01/2016  . Attention deficit hyperactivity disorder (ADHD), combined type 09/01/2016  . Presence of insulin pump 04/20/2015  . Type 1 diabetes mellitus ([place]) 09/24/1999    Prenatal labs and studies: ABO, Rh: --/--/B POS Performed at [facility], [address] [place], [place] [address]  [phone]) Antibody: NEG (06/23 1247) Rubella: 1.03 (12/02 1530) RPR: Non Reactive (04/22 0930)  HBsAg: Negative (12/02 1530)  HIV: Non Reactive (12/02 1530)  GBS:--/Negative (06/17 1116)   Past Medical History:  Diagnosis Date  . ADHD   . ADHD   . Anxiety   . COVID-19   . Diabetes mellitus type 1 (HCC)    Type 1  . Headache    Hormonal Migraines   . History of cesarean section 09/01/2016  . History of chlamydia infection 09/01/2016    2007  . PONV (postoperative nausea and vomiting)   . STD (sexually transmitted disease)    chlaymdia- 10 years ago     Past Surgical History:  Procedure Laterality Date  . CESAREAN SECTION    . TOE SURGERY Right 2020  . WISDOM TOOTH EXTRACTION Bilateral    Highschool     Medications    Current Discharge Medication List    CONTINUE these medications which have NOT CHANGED   Details  aspirin EC 81 MG tablet Take 81 mg by mouth daily.    Butalbital-APAP-Caffeine 50-325-40 MG capsule Take 1-2 capsules by mouth every 6 (six) hours as needed for headache. Qty: 3 capsule, Refills: 0   Associated Diagnoses: Acute nonintractable headache, unspecified headache type    glucagon (GLUCAGON EMERGENCY) 1 MG injection Inject 1 mg into the skin once as needed.     hydrOXYzine (ATARAX/VISTARIL) 25 MG tablet TAKE 0.5-1 TABLETS (12.5-25 MG TOTAL) BY MOUTH DAILY AS NEEDED. ONLY FOR SEVERE PANIC ATTACKS Qty: 90 tablet, Refills: 0   Associated Diagnoses: GAD (generalized anxiety disorder)    insulin aspart (NOVOLOG) 100 UNIT/ML injection USE VIA INSULIN PUMP. MAX OF 80 UNITS PER DAY. Qty: 90 mL, Refills: 3    metoCLOPramide (REGLAN) 10 MG tablet Take 1 tablet (10 mg total) by mouth every 6 (six) hours as needed for nausea  or vomiting. Qty: 60 tablet, Refills: 1    Prenatal Vit-Fe Fumarate-FA (PRENATAL MULTIVITAMIN) TABS tablet Take 1 tablet by mouth daily at 12 noon.    glucose blood (FREESTYLE PRECISION NEO TEST) test strip Use to monitor glucose levels 8 times per day; E11.9 Qty: 100 each, Refills: 12         Allergies  Amoxicillin and Penicillins  Review of Systems  Pertinent items noted in HPI and remainder of comprehensive ROS otherwise negative.  Physical Exam  BP 130/71   Pulse (!) 107   Temp (!) 97.5 F (36.4 C) (Oral)   Resp 17   Ht 5' 0.5" (1.537 m)   Wt 91.2 kg   LMP 07/08/2019   BMI 38.61 kg/m   Lungs:  CTA B Cardio: RRR without M/R/G Abd: Soft, gravid,  NT Presentation: cephalic EXT: No C/C/ 1+ Edema DTRs: 2+ B CERVIX:    See Prenatal records for more detailed PE.     FHR:  Variability: Good {> 6 bpm)  Toco: Uterine Contractions: None  Test Results  Results for orders placed or performed during the hospital encounter of 03/27/20 (from the past 24 hour(s))  Glucose, capillary     Status: Abnormal   Collection Time: 03/27/20  5:55 AM  Result Value Ref Range   Glucose-Capillary 123 (H) 70 - 99 mg/dL  ABO/Rh     Status: None   Collection Time: 03/27/20  6:37 AM  Result Value Ref Range   ABO/RH(D)      B POS Performed at [facility], [address], [place], [place] [address]      Assessment   [phone] at [redacted]w[redacted]d Estimated Date of Delivery: 04/13/20  The fetus is reassuring.   Patient Active Problem List   Diagnosis Date Noted  . Nausea and vomiting during pregnancy 02/29/2020  . Diabetes mellitus in pregnancy in third trimester 01/09/2020  . Pregnancy affected by fetal growth restriction 01/09/2020  . Panic attacks 08/19/2019  . Attention deficit hyperactivity disorder (ADHD), predominantly inattentive type 07/10/2019  . GAD (generalized anxiety disorder) 03/05/2019  . Recurrent major depressive disorder (HCC) 03/05/2019  . Toe pain, left 08/15/2018  . Breast cyst, right 03/07/2017  . History of cesarean delivery 09/01/2016  . Obesity (BMI 30.0-34.9) 09/01/2016  . Attention deficit hyperactivity disorder (ADHD), combined type 09/01/2016  . Presence of insulin pump 04/20/2015  . Type 1 diabetes mellitus (HCC) 09/24/1999    Plan  1. Admit to L&D :   2. EFM: -- Category 1 3. Stadol or Epidural if desired.   4. Admission labs  5. Repeat CD  Elonda Husky, M.D. 03/27/2020 7:28 AM

## 2020-03-27 NOTE — Progress Notes (Addendum)
Inpatient Diabetes Program Recommendations  AACE/ADA: New Consensus Statement on Inpatient Glycemic Control (2015)  Target Ranges:  Prepandial:   less than 140 mg/dL      Peak postprandial:   less than 180 mg/dL (1-2 hours)      Critically ill patients:  140 - 180 mg/dL   Lab Results  Component Value Date   GLUCAP 123 (H) 03/27/2020   HGBA1C 6.0 (A) 03/26/2020    Review of Glycemic Control Results for KAITYLN, KALLSTROM (MRN [id number]) as of 03/27/2020 11:24  Ref. Range 03/27/2020 05:55  Glucose-Capillary Latest Ref Range: 70 - 99 mg/dL 063 (H)   Diabetes history: DM 1 Outpatient Diabetes medications:  Pregnancy settings:  1.7 units/hr (except 1.85 units/hr 3a-6a), 1 units/2 grams of CHO, 1 unit for every 25 mg/dL>110 mg/dL PER MD he suggests postpartum settings to be:  1.1 units/hr, (except 1.5 units 6am-9pm)  1 unit/ 5 grams of CHO, 1 unit for every 50 mg/dL>110 mg/dL Current orders for Inpatient glycemic control:  Insulin pump with sensor  Inpatient Diabetes Program Recommendations:    Called and spoke with patient's RN to remind her that patient will need to reduce her insulin pump settings as instructed by endocrinologist.  Note that reductions are about 35% less than pregnancy settings.  May actually need even more reduction in basal rate.  Patient is wearing continuous glucose monitor so will remind her to check blood sugars closely.  Per RN patient and her husband are reducing basal rates at this time per "sheet" that was given to her by MD.   Will see patient today.   Thanks  Beryl Meager, RN, BC-ADM Inpatient Diabetes Coordinator Pager [phone] (8a-5p)  1400: Addendum:  Visited with patient.  She states that she has changed her basal/bolus settings per instructions from endocrinologist.  She wears a Tandem insulin pump and a Dexcom sensor.  Current blood sugar was 112 mg/dL.  We discussed that insulin needs often drastically drop after delivery and breast  feeding.  Patient verbalized understanding.  Her insulin pump does self- regulate when blood sugars are dropping.  Patient is very in tune to her DM needs.  No further questions at this time.

## 2020-03-27 NOTE — Progress Notes (Addendum)
Bedtime Insulin Documentation  Time: 2150 Glucose: 108  Carbs: 20 Bolus:2.97 units per patient

## 2020-03-27 NOTE — Interval H&P Note (Signed)
History and Physical Interval Note:  03/27/2020 7:29 AM  Jordan Leonard  has presented today for surgery, with the diagnosis of Repeat Cesarean Section.  The various methods of treatment have been discussed with the patient and family. After consideration of risks, benefits and other options for treatment, the patient has consented to  Procedure(s) with comments: CESAREAN SECTION (Bilateral) - Repeat C-Section as a surgical intervention.  The patient's history has been reviewed, patient examined, no change in status, stable for surgery.  I have reviewed the patient's chart and labs.  Questions were answered to the patient's satisfaction.     Brennan Bailey

## 2020-03-27 NOTE — Lactation Note (Signed)
This note was copied from a baby's chart. Lactation Consultation Note  Patient Name: Jordan Leonard HQUIQ'N Date: 03/27/2020 Reason for consult: Follow-up assessment;Other (Comment) (previous low sugar and poor latch) Baby latched easily on right breast in football hold with intermittent audible swallows. Mother reports feeling at ease and excited about success with this feeding. To call LC or RN for another feeding or two.   Maternal Data    Feeding Feeding Type: Bottle Fed - Formula Nipple Type: Slow - flow  LATCH Score Latch: Grasps breast easily, tongue down, lips flanged, rhythmical sucking.  Audible Swallowing: Spontaneous and intermittent  Type of Nipple: Everted at rest and after stimulation  Comfort (Breast/Nipple): Soft / non-tender  Hold (Positioning): Assistance needed to correctly position infant at breast and maintain latch.  LATCH Score: 9  Interventions Interventions: Assisted with latch;Adjust position;Support pillows;Position options  Lactation Tools Discussed/Used     Consult Status Consult Status: Complete    Matt Holmes 03/27/2020, 2:41 PM

## 2020-03-27 NOTE — Anesthesia Procedure Notes (Signed)
Spinal  Patient location during procedure: OR End time: 03/27/2020 7:46 AM Staffing Performed: resident/CRNA  Anesthesiologist: Emmie Niemann, MD Resident/CRNA: Jonna Clark, CRNA Preanesthetic Checklist Completed: patient identified, IV checked, site marked, risks and benefits discussed, surgical consent, monitors and equipment checked, pre-op evaluation and timeout performed Spinal Block Patient position: sitting Prep: Betadine Patient monitoring: heart rate, continuous pulse ox, blood pressure and cardiac monitor Approach: midline Location: L4-5 Injection technique: single-shot Needle Needle type: Whitacre and Introducer  Needle gauge: 24 G Needle length: 9 cm Assessment Sensory level: T6 Additional Notes Negative paresthesia. Negative blood return. Positive free-flowing CSF. Expiration date of kit checked and confirmed. Patient tolerated procedure well, without complications.

## 2020-03-27 NOTE — Transfer of Care (Signed)
Immediate Anesthesia Transfer of Care Note  Patient: Jordan Leonard  Procedure(s) Performed: CESAREAN SECTION (Bilateral )  Patient Location: Mother/Baby  Anesthesia Type:Spinal  Level of Consciousness: awake, alert  and oriented  Airway & Oxygen Therapy: Patient Spontanous Breathing and Patient connected to nasal cannula oxygen  Post-op Assessment: Report given to RN and Post -op Vital signs reviewed and stable  Post vital signs: Reviewed and stable  Last Vitals:  Vitals Value Taken Time  BP 105/57 03/27/20 0914  Temp    Pulse 103 03/27/20 0915  Resp 22 03/27/20 0915  SpO2 96 % 03/27/20 0915  Vitals shown include unvalidated device data.  Last Pain:  Vitals:   03/27/20 0717  TempSrc: Oral  PainSc:          Complications: No complications documented.

## 2020-03-28 LAB — GLUCOSE, CAPILLARY: Glucose-Capillary: 91 mg/dL (ref 70–99)

## 2020-03-28 MED ORDER — ACETAMINOPHEN 325 MG PO TABS
650.0000 mg | ORAL_TABLET | ORAL | Status: DC | PRN
  Administered 2020-03-28: 650 mg via ORAL
  Filled 2020-03-28: qty 2

## 2020-03-28 MED ORDER — IBUPROFEN 800 MG PO TABS
800.0000 mg | ORAL_TABLET | Freq: Three times a day (TID) | ORAL | Status: DC
  Administered 2020-03-28 – 2020-03-29 (×2): 800 mg via ORAL
  Filled 2020-03-28 (×2): qty 1

## 2020-03-28 NOTE — Anesthesia Postprocedure Evaluation (Signed)
Anesthesia Post Note  Patient: Jordan Leonard  Procedure(s) Performed: CESAREAN SECTION (Bilateral )  Patient location during evaluation: Mother Baby Anesthesia Type: Spinal Level of consciousness: oriented and awake and alert Pain management: pain level controlled Vital Signs Assessment: post-procedure vital signs reviewed and stable Respiratory status: spontaneous breathing, respiratory function stable and patient connected to nasal cannula oxygen Cardiovascular status: blood pressure returned to baseline and stable Postop Assessment: no headache, no backache, no apparent nausea or vomiting, spinal receding, patient able to bend at knees and able to ambulate Anesthetic complications: no   No complications documented.   Last Vitals:  Vitals:   03/28/20 0803 03/28/20 0900  BP: 103/69   Pulse: 83 88  Resp: 20   Temp: 36.9 C   SpO2: 98% 99%    Last Pain:  Vitals:   03/28/20 0900  TempSrc:   PainSc: 0-No pain                 Corinda Gubler

## 2020-03-28 NOTE — Progress Notes (Signed)
Patient ID: SHERMAN LIPUMA, female   DOB: [birth year], 29 y.o.   MRN: [id number]     Progress Note - Cesarean Delivery  MATTIA OSTERMAN is a 29 y.o. G2P1102 now PP day 1 s/p C-Section, Low Transverse .   Subjective:  Patient reports no problems with eating, bowel movements, voiding, or their wound  Breast and bottlefeeding  Pain controlled  Objective:  Vital signs in last 24 hours: Temp:  [98.1 F (36.7 C)-98.8 F (37.1 C)] 98.4 F (36.9 C) (06/26 0803) Pulse Rate:  [78-100] 88 (06/26 0900) Resp:  [18-20] 20 (06/26 0803) BP: (103-132)/(68-81) 103/69 (06/26 0803) SpO2:  [96 %-100 %] 99 % (06/26 0900)  Physical Exam:  General: alert, cooperative and no distress Lochia: appropriate Uterine Fundus: firm Incision: Dressing intact    Data Review Recent Labs    03/25/20 1247  HGB 13.4  HCT 38.2    Assessment:  Active Problems:   Postoperative state   Status post Cesarean section. Doing well postoperatively.   Patient using insulin pump successfully  Plan:       Continue current care.  Probable discharge in a.m.  Elonda Husky, M.D. 03/28/2020 11:08 AM

## 2020-03-28 NOTE — Anesthesia Post-op Follow-up Note (Signed)
  Anesthesia Pain Follow-up Note  Patient: Jordan Leonard  Day #: 1  Date of Follow-up: 03/28/2020 Time: 9:49 AM  Last Vitals:  Vitals:   03/28/20 0803 03/28/20 0900  BP: 103/69   Pulse: 83 88  Resp: 20   Temp: 36.9 C   SpO2: 98% 99%    Level of Consciousness: alert  Pain: none   Side Effects:None  Catheter Site Exam:clean, dry, no drainage     Plan: D/C from anesthesia care at surgeon's request  Corinda Gubler

## 2020-03-29 ENCOUNTER — Ambulatory Visit: Payer: Self-pay

## 2020-03-29 LAB — GLUCOSE, CAPILLARY: Glucose-Capillary: 82 mg/dL (ref 70–99)

## 2020-03-29 MED ORDER — HYDROCODONE-ACETAMINOPHEN 5-325 MG PO TABS: 1.0000 | ORAL_TABLET | Freq: Four times a day (QID) | ORAL | 0 refills | Status: DC | PRN

## 2020-03-29 MED ORDER — COCONUT OIL OIL: 1.0000 "application " | TOPICAL_OIL | Status: DC | PRN

## 2020-03-29 NOTE — Discharge Summary (Signed)
Physician Obstetric Discharge Summary  Patient Name: Jordan Leonard DOB: 02-09-1991 MRN: [id number]                            Discharge Summary  Date of Admission: 03/27/2020 Date of Discharge: 03/29/2020 Delivering Provider: Harlin Heys   Admitting Diagnosis: Postoperative state [Z98.890] at [redacted]w[redacted]d Secondary diagnosis:  Active Problems:   Postoperative state   Mode of Delivery:       low uterine, transverse     Discharge diagnosis: Term Pregnancy Delivered and Type 2 DM      Intrapartum Procedures:    Post partum procedures:   Complications: none                     Discharge Day SOAP Note:  Subjective:  The patient has no complaints.  She is ambulating well. She is taking PO well. Pain is well controlled with current medications. Patient is urinating without difficulty.   She is passing flatus.  Controlling sugars with insulin pump.  Objective  Vital signs: BP 127/83 (BP Location: Left Arm)   Pulse (!) 102   Temp 98 F (36.7 C) (Oral)   Resp 20   Ht 5' 0.5" (1.537 m)   Wt 91.2 kg   LMP 07/08/2019   SpO2 99%   Breastfeeding Unknown   BMI 38.61 kg/m   Physical Exam: Gen: NAD Abdomen:  clean, dry, no drainage Fundus Fundal Tone: Firm  Lochia Amount: Small     Data Review Labs: Lab Results  Component Value Date   WBC 10.1 03/25/2020   HGB 13.4 03/25/2020   HCT 38.2 03/25/2020   MCV 84.9 03/25/2020   PLT 253 03/25/2020   CBC Latest Ref Rng & Units 03/25/2020 01/23/2020 10/10/2019  WBC 4.0 - 10.5 K/uL 10.1 10.0 12.3(H)  Hemoglobin 12.0 - 15.0 g/dL 13.4 12.5 13.7  Hematocrit 36 - 46 % 38.2 37.0 40.1  Platelets 150 - 400 K/uL 253 266 320   B POS Performed at [facility], [address] [place], [place] [address]   Flavia Shipper Score: Flavia Shipper Postnatal Depression Scale Screening Tool 03/28/2020  I have been able to laugh and see the funny side of things. 0  I have looked forward with enjoyment to things. 0  I have blamed  myself unnecessarily when things went wrong. 2  I have been anxious or worried for no good reason. 1  I have felt scared or panicky for no good reason. 0  Things have been getting on top of me. 0  I have been so unhappy that I have had difficulty sleeping. 0  I have felt sad or miserable. 0  I have been so unhappy that I have been crying. 0  The thought of harming myself has occurred to me. 0  Edinburgh Postnatal Depression Scale Total 3    Assessment:  Active Problems:   Postoperative state   Doing well.  Normal progress as expected.  Plan:  Discharge to home  Modified rest as directed - may slowly resume normal activities with restrictions  as discussed.  Medications as written.  See below for additional.      Discharge Instructions: Per After Visit Summary. Activity: Advance as tolerated. Pelvic rest for 6 weeks.  Also refer to After Visit Summary.  Wound care discussed. Diet: Regular Medications: Allergies as of 03/29/2020      Reactions   Amoxicillin Anaphylaxis   Penicillins  Anaphylaxis      Medication List    STOP taking these medications   aspirin EC 81 MG tablet   Butalbital-APAP-Caffeine 50-325-40 MG capsule   metoCLOPramide 10 MG tablet Commonly known as: REGLAN     TAKE these medications   glucagon 1 MG injection Inject 1 mg into the skin once as needed.   glucose blood test strip Commonly known as: FreeStyle Precision Neo Test Use to monitor glucose levels 8 times per day; E11.9   HYDROcodone-acetaminophen 5-325 MG tablet Commonly known as: NORCO/VICODIN Take 1 tablet by mouth every 6 (six) hours as needed for moderate pain.   hydrOXYzine 25 MG tablet Commonly known as: ATARAX/VISTARIL TAKE 0.5-1 TABLETS (12.5-25 MG TOTAL) BY MOUTH DAILY AS NEEDED. ONLY FOR SEVERE PANIC ATTACKS   insulin aspart 100 UNIT/ML injection Commonly known as: NovoLOG USE VIA INSULIN PUMP. MAX OF 80 UNITS PER DAY. What changed:   how much to take  how to take  this  when to take this   prenatal multivitamin Tabs tablet Take 1 tablet by mouth daily at 12 noon.            Discharge Care Instructions  (From admission, onward)         Start     Ordered   03/29/20 0000  No dressing needed       Comments: Keep wound area clean and dry   03/29/20 0942         Outpatient follow up:   Follow-up Information    Linzie Collin, MD Follow up in 1 week(s).   Specialties: Obstetrics and Gynecology, Radiology Contact information: [address] [place] [address] [phone]              Postpartum contraception: Will discuss at first post-partum visit.  Discharged Condition: good  Discharged to: home  Newborn Data: Disposition:home with mother  Apgars: APGAR (1 MIN): 9   APGAR (5 MINS): 9   APGAR (10 MINS):    Baby Feeding: Bottle and Breast  Elonda Husky, M.D. 03/29/2020 9:43 AM

## 2020-03-29 NOTE — Progress Notes (Signed)
Discharge instructions complete and prescriptions sent to the pharmacy by the provider. Patient verbalizes understanding of teaching. Patient discharged home via wheelchair at 1355.

## 2020-03-29 NOTE — Lactation Note (Addendum)
This note was copied from a baby's chart. Lactation Consultation Note  Patient Name: Jordan Leonard [id number] Date: 03/29/2020   Mom has been struggling with latching Jordan Leonard on her own since birth.  Her initial blood glucose was low and mom is IDDM with insulin pump.  Mom's left nipple is slightly inverted requiring #20 nipple shield to obtain and sustain latch.  Worked with mom before discharge to latch Jordan Leonard independently.  Mom's nipples are sore.  Coconut oil and comfort gels given with instructions in alternating use.  Reviewed supply and demand encouraging mom to put her to the breast frequently to bring in mature milk, ensure a plentiful milk supply and prevent engorgement.  Mom attempted to breast feed her first baby for 2 weeks with little success.  Mom had lots of complications after birth with her first which delayed breast feeding which could have influenced her milk supply.  Reviewed normal newborn stomach size, adequate intake and out put, feeding cues, normal course of lactation and routine newborn feeding patterns.  Mom and baby to be discharged home today.  Lactation community resource hand out given with contact numbers and encouraged to call with any questions, concerns or assistance.  Maternal Data    Feeding    LATCH Score                   Interventions    Lactation Tools Discussed/Used     Consult Status      Jordan Leonard 03/29/2020, 10:14 PM

## 2020-03-31 DIAGNOSIS — Z794 Long term (current) use of insulin: Secondary | ICD-10-CM | POA: Diagnosis not present

## 2020-03-31 DIAGNOSIS — E108 Type 1 diabetes mellitus with unspecified complications: Secondary | ICD-10-CM | POA: Diagnosis not present

## 2020-03-31 DIAGNOSIS — E109 Type 1 diabetes mellitus without complications: Secondary | ICD-10-CM | POA: Diagnosis not present

## 2020-04-02 ENCOUNTER — Other Ambulatory Visit: Payer: Self-pay

## 2020-04-02 ENCOUNTER — Ambulatory Visit (INDEPENDENT_AMBULATORY_CARE_PROVIDER_SITE_OTHER): Payer: BC Managed Care – PPO | Admitting: Obstetrics and Gynecology

## 2020-04-02 ENCOUNTER — Encounter: Payer: Self-pay | Admitting: Obstetrics and Gynecology

## 2020-04-02 VITALS — BP 112/72 | HR 89 | Ht 60.0 in | Wt 184.5 lb

## 2020-04-02 DIAGNOSIS — Z9889 Other specified postprocedural states: Secondary | ICD-10-CM

## 2020-04-02 NOTE — Progress Notes (Signed)
HPI:      [name] is a 29 y.o. [phone] who LMP was No LMP recorded.  Subjective:   She presents today 1 week postop from repeat cesarean delivery.  She reports she is doing well.  Eating voiding bowel movements without issue.  Pain easily controlled with ibuprofen.  She states she is having minimal bleeding. She is breast-feeding and this is going well this time.  She is appropriately excited about her success with breast-feeding. She continues to carefully monitor her sugars and is regularly adjusting insulin doses based on fluctuating caloric intake and insulin needs.    Hx: The following portions of the patient's history were reviewed and updated as appropriate:             She  has a past medical history of ADHD, ADHD, Anxiety, COVID-19, Diabetes mellitus type 1 (HCC), Headache, History of cesarean section (09/01/2016), History of chlamydia infection (09/01/2016), PONV (postoperative nausea and vomiting), and STD (sexually transmitted disease). She does not have any pertinent problems on file. She  has a past surgical history that includes Cesarean section; Toe Surgery (Right, 2020); Wisdom tooth extraction (Bilateral); and Cesarean section (Bilateral, 03/27/2020). Her family history includes Anxiety disorder in her sister; Asthma in her sister; Depression in her mother and sister; Hypertension in her father and mother; Multiple sclerosis in her sister. She  reports that she has never smoked. She has never used smokeless tobacco. She reports previous alcohol use. She reports that she does not use drugs. She has a current medication list which includes the following prescription(s): glucagon, glucose blood, hydrocodone-acetaminophen, hydroxyzine, insulin aspart, and prenatal multivitamin. She is allergic to amoxicillin and penicillins.       Review of Systems:  Review of Systems  Constitutional: Denied constitutional symptoms, night sweats, recent illness, fatigue, fever,  insomnia and weight loss.  Eyes: Denied eye symptoms, eye pain, photophobia, vision change and visual disturbance.  Ears/Nose/Throat/Neck: Denied ear, nose, throat or neck symptoms, hearing loss, nasal discharge, sinus congestion and sore throat.  Cardiovascular: Denied cardiovascular symptoms, arrhythmia, chest pain/pressure, edema, exercise intolerance, orthopnea and palpitations.  Respiratory: Denied pulmonary symptoms, asthma, pleuritic pain, productive sputum, cough, dyspnea and wheezing.  Gastrointestinal: Denied, gastro-esophageal reflux, melena, nausea and vomiting.  Genitourinary: Denied genitourinary symptoms including symptomatic vaginal discharge, pelvic relaxation issues, and urinary complaints.  Musculoskeletal: Denied musculoskeletal symptoms, stiffness, swelling, muscle weakness and myalgia.  Dermatologic: Denied dermatology symptoms, rash and scar.  Neurologic: Denied neurology symptoms, dizziness, headache, neck pain and syncope.  Psychiatric: Denied psychiatric symptoms, anxiety and depression.  Endocrine: Denied endocrine symptoms including hot flashes and night sweats.   Meds:   Current Outpatient Medications on File Prior to Visit  Medication Sig Dispense Refill  . glucagon (GLUCAGON EMERGENCY) 1 MG injection Inject 1 mg into the skin once as needed.     Marland Kitchen glucose blood (FREESTYLE PRECISION NEO TEST) test strip Use to monitor glucose levels 8 times per day; E11.9 100 each 12  . HYDROcodone-acetaminophen (NORCO/VICODIN) 5-325 MG tablet Take 1 tablet by mouth every 6 (six) hours as needed for moderate pain. 15 tablet 0  . hydrOXYzine (ATARAX/VISTARIL) 25 MG tablet TAKE 0.5-1 TABLETS (12.5-25 MG TOTAL) BY MOUTH DAILY AS NEEDED. ONLY FOR SEVERE PANIC ATTACKS 90 tablet 0  . insulin aspart (NOVOLOG) 100 UNIT/ML injection USE VIA INSULIN PUMP. MAX OF 80 UNITS PER DAY. (Patient taking differently: Inject 80 Units into the skin continuous. USE VIA INSULIN PUMP. MAX OF 80 UNITS PER  DAY.)  90 mL 3  . Prenatal Vit-Fe Fumarate-FA (PRENATAL MULTIVITAMIN) TABS tablet Take 1 tablet by mouth daily at 12 noon.     No current facility-administered medications on file prior to visit.    Objective:     Vitals:   04/02/20 1053  BP: 112/72  Pulse: 89               Abdomen: Soft.  Non-tender.  No masses.  No HSM.  Incision/s: Intact.  Healing well.  No erythema.  No drainage.      Assessment:    [id number] Patient Active Problem List   Diagnosis Date Noted  . Postoperative state 03/27/2020  . Nausea and vomiting during pregnancy 02/29/2020  . Diabetes mellitus in pregnancy in third trimester 01/09/2020  . Pregnancy affected by fetal growth restriction 01/09/2020  . Panic attacks 08/19/2019  . Attention deficit hyperactivity disorder (ADHD), predominantly inattentive type 07/10/2019  . GAD (generalized anxiety disorder) 03/05/2019  . Recurrent major depressive disorder (HCC) 03/05/2019  . Toe pain, left 08/15/2018  . Breast cyst, right 03/07/2017  . History of cesarean delivery 09/01/2016  . Obesity (BMI 30.0-34.9) 09/01/2016  . Attention deficit hyperactivity disorder (ADHD), combined type 09/01/2016  . Presence of insulin pump 04/20/2015  . Type 1 diabetes mellitus (HCC) 09/24/1999     1. Post-operative state     Patient with excellent recovery.     Plan:            1.  Wound care discussed.  2.  Continue tight glycemic control if possible. Orders No orders of the defined types were placed in this encounter.   No orders of the defined types were placed in this encounter.     F/U  Return in about 5 weeks (around 05/07/2020).  Elonda Husky, M.D. 04/02/2020 11:30 AM

## 2020-04-07 ENCOUNTER — Other Ambulatory Visit: Payer: Self-pay | Admitting: Surgical

## 2020-04-07 MED ORDER — NITROFURANTOIN MONOHYD MACRO 100 MG PO CAPS
100.0000 mg | ORAL_CAPSULE | Freq: Two times a day (BID) | ORAL | 0 refills | Status: DC
Start: 2020-04-07 — End: 2020-05-06

## 2020-04-15 ENCOUNTER — Other Ambulatory Visit: Payer: Self-pay | Admitting: Surgical

## 2020-04-15 DIAGNOSIS — F411 Generalized anxiety disorder: Secondary | ICD-10-CM

## 2020-04-15 MED ORDER — HYDROXYZINE HCL 25 MG PO TABS: 12.5000 mg | ORAL_TABLET | Freq: Every day | ORAL | 1 refills | Status: AC | PRN

## 2020-04-20 ENCOUNTER — Ambulatory Visit (INDEPENDENT_AMBULATORY_CARE_PROVIDER_SITE_OTHER): Payer: BC Managed Care – PPO | Admitting: Endocrinology

## 2020-04-20 ENCOUNTER — Other Ambulatory Visit: Payer: Self-pay

## 2020-04-20 VITALS — BP 116/82 | HR 84 | Ht 60.0 in | Wt 183.8 lb

## 2020-04-20 DIAGNOSIS — E109 Type 1 diabetes mellitus without complications: Secondary | ICD-10-CM

## 2020-04-20 NOTE — Patient Instructions (Addendum)
Please continue these settings:  basal rate of 1.0 units/hr, 24 HRS per day mealtime bolus of 1 unit/5 grams carbohydrate at all times of day.   correction bolus (which some people call "sensitivity," or "insulin sensitivity ratio," or just "isr") of 1 unit for each 50 by which your glucose exceeds 100.   Please come back for a follow-up appointment in 2 months.

## 2020-04-20 NOTE — Progress Notes (Signed)
Subjective:    Patient ID: Jordan Leonard, female    DOB: 1991-01-12, 29 y.o.   MRN: [id number]  HPI Pt returns for f/u of diabetes mellitus:  DM type: 1 Dx'ed: 2000 Complications: none Therapy: insulin since dx, and pump rx since 2005 (Tandem pump and Dexcom G6 continuous glucose monitor since 2020).   GDM: never.  DKA: only once, at dx Severe hypoglycemia: never.  Pancreatitis: never Pancreatic imaging: never.  Other: she eats meals at 8 AM, noon, and 6 PM; she works from home now.   Interval history:  She takes these pump settings:  basal rate of 1.0 units/hr, 6 AM-9PM, and 1.5/HR at other times.   mealtime bolus of 1 unit/5 grams carbohydrate at all times of day.   correction bolus (which some people call "sensitivity," or "insulin sensitivity ratio," or just "isr") of 1 unit for each 50 by which your glucose exceeds 100.   continuous glucose monitor is downloaded today, and the printout is scanned into the record.  glucose varies from 45-300, but average is 127.  It is in general lowest 9PM-1AM.   TDD is 35 units (53% basal).  Pt says she eats just 2 meals per day.  She is 3 weeks postpartum, and is breast feeding.   Past Medical History:  Diagnosis Date  . ADHD   . ADHD   . Anxiety   . COVID-19   . Diabetes mellitus type 1 (HCC)    Type 1  . Headache    Hormonal Migraines   . History of cesarean section 09/01/2016  . History of chlamydia infection 09/01/2016   2007  . PONV (postoperative nausea and vomiting)   . STD (sexually transmitted disease)    chlaymdia- 10 years ago    Past Surgical History:  Procedure Laterality Date  . CESAREAN SECTION    . CESAREAN SECTION Bilateral 03/27/2020   Procedure: CESAREAN SECTION;  Surgeon: Linzie Collin, MD;  Location: [facility] ORS;  Service: Obstetrics;  Laterality: Bilateral;  Repeat C-Section  . TOE SURGERY Right 2020  . WISDOM TOOTH EXTRACTION Bilateral    Highschool    Social History   Socioeconomic History    . Marital status: Married    Spouse name: Jill Alexanders  . Number of children: 1  . Years of education: Not on file  . Highest education level: Not on file  Occupational History  . Not on file  Tobacco Use  . Smoking status: Never Smoker  . Smokeless tobacco: Never Used  Vaping Use  . Vaping Use: Never used  Substance and Sexual Activity  . Alcohol use: Not Currently    Comment: rare  . Drug use: No  . Sexual activity: Yes  Other Topics Concern  . Not on file  Social History Narrative   Married.    1 child.   Works at [employer].   Enjoys being with family, working on mind puzzles   Social Determinants of Corporate investment banker Strain:   . Difficulty of Paying Living Expenses:   Food Insecurity:   . Worried About Programme researcher, broadcasting/film/video in the Last Year:   . Barista in the Last Year:   Transportation Needs:   . Freight forwarder (Medical):   Marland Kitchen Lack of Transportation (Non-Medical):   Physical Activity:   . Days of Exercise per Week:   . Minutes of Exercise per Session:   Stress:   . Feeling of Stress :  Social Connections:   . Frequency of Communication with Friends and Family:   . Frequency of Social Gatherings with Friends and Family:   . Attends Religious Services:   . Active Member of Clubs or Organizations:   . Attends Banker Meetings:   Marland Kitchen Marital Status:   Intimate Partner Violence:   . Fear of Current or Ex-Partner:   . Emotionally Abused:   Marland Kitchen Physically Abused:   . Sexually Abused:     Current Outpatient Medications on File Prior to Visit  Medication Sig Dispense Refill  . glucagon (GLUCAGON EMERGENCY) 1 MG injection Inject 1 mg into the skin once as needed.     Marland Kitchen glucose blood (FREESTYLE PRECISION NEO TEST) test strip Use to monitor glucose levels 8 times per day; E11.9 100 each 12  . insulin aspart (NOVOLOG) 100 UNIT/ML injection USE VIA INSULIN PUMP. MAX OF 80 UNITS PER DAY. (Patient taking differently: Inject 80  Units into the skin continuous. USE VIA INSULIN PUMP. MAX OF 80 UNITS PER DAY.) 90 mL 3  . HYDROcodone-acetaminophen (NORCO/VICODIN) 5-325 MG tablet Take 1 tablet by mouth every 6 (six) hours as needed for moderate pain. 15 tablet 0  . hydrOXYzine (ATARAX/VISTARIL) 25 MG tablet Take 0.5-1 tablets (12.5-25 mg total) by mouth daily as needed. Only for severe panic attacks 90 tablet 1  . nitrofurantoin, macrocrystal-monohydrate, (MACROBID) 100 MG capsule Take 1 capsule (100 mg total) by mouth 2 (two) times daily. 14 capsule 0  . Prenatal Vit-Fe Fumarate-FA (PRENATAL MULTIVITAMIN) TABS tablet Take 1 tablet by mouth daily at 12 noon.     No current facility-administered medications on file prior to visit.    Allergies  Allergen Reactions  . Amoxicillin Anaphylaxis  . Penicillins Anaphylaxis    Family History  Problem Relation Age of Onset  . Asthma Sister   . Multiple sclerosis Sister   . Anxiety disorder Sister   . Depression Sister   . Depression Mother   . Hypertension Mother   . Hypertension Father   . Breast cancer Neg Hx   . Ovarian cancer Neg Hx   . Colon cancer Neg Hx   . Diabetes Neg Hx   . Heart disease Neg Hx   . Diabetes gravidarum Neg Hx   . Diabetes type I Neg Hx     BP 116/82 (BP Location: Left Arm, Patient Position: Sitting)   Pulse 84   Ht 5' (1.524 m)   Wt 183 lb 12.8 oz (83.4 kg)   SpO2 98%   BMI 35.90 kg/m   Review of Systems Denies LOC    Objective:   Physical Exam VITAL SIGNS:  See vs page GENERAL: no distress Pulses: dorsalis pedis intact bilat.   MSK: no deformity of the feet CV: trace bilat leg edema Skin:  no ulcer on the feet.  normal color and temp on the feet. Neuro: sensation is intact to touch on the feet      Assessment & Plan:  Type 1 DM Hypoglycemia, due to insulin: we'll reduce late night basal.   Patient Instructions  Please continue these settings:  basal rate of 1.0 units/hr, 24 HRS per day mealtime bolus of 1 unit/5  grams carbohydrate at all times of day.   correction bolus (which some people call "sensitivity," or "insulin sensitivity ratio," or just "isr") of 1 unit for each 50 by which your glucose exceeds 100.   Please come back for a follow-up appointment in 2 months.

## 2020-04-27 NOTE — Addendum Note (Signed)
Encounter addended by: Arnoldo Hooker, RDMS on: 04/27/2020 8:42 AM  Actions taken: Imaging Exam begun

## 2020-04-27 NOTE — Addendum Note (Signed)
Encounter addended by: Boyce Medici on: 04/27/2020 12:18 PM  Actions taken: Imaging Exam ended

## 2020-05-06 ENCOUNTER — Encounter: Payer: Self-pay | Admitting: Obstetrics and Gynecology

## 2020-05-06 ENCOUNTER — Ambulatory Visit (INDEPENDENT_AMBULATORY_CARE_PROVIDER_SITE_OTHER): Payer: BC Managed Care – PPO | Admitting: Obstetrics and Gynecology

## 2020-05-06 VITALS — BP 106/75 | HR 94 | Ht 60.0 in | Wt 184.0 lb

## 2020-05-06 DIAGNOSIS — Z3009 Encounter for other general counseling and advice on contraception: Secondary | ICD-10-CM

## 2020-05-06 DIAGNOSIS — Z30011 Encounter for initial prescription of contraceptive pills: Secondary | ICD-10-CM

## 2020-05-06 DIAGNOSIS — Z9889 Other specified postprocedural states: Secondary | ICD-10-CM

## 2020-05-06 MED ORDER — LEVONORGEST-ETH ESTRAD 91-DAY 0.15-0.03 &0.01 MG PO TABS: 1.0000 | ORAL_TABLET | Freq: Every day | ORAL | 1 refills | Status: DC

## 2020-05-06 NOTE — Progress Notes (Signed)
HPI:      Jordan Leonard is a 29 y.o. [phone] who LMP was No LMP recorded.  Subjective:   She presents today approximately 6 weeks postpartum.  She is doing very well.  She has resumed normal activities with the exception of intercourse.  She has her insulin adjusted by her endocrinologist and has an insulin pump. She is no longer breast-feeding (baby had an issue with weight gain). She desires birth control and even though she had the Mirena for 10 years she would like to try OCPs again.  She felt like she had an increase in acne with her Mirena.    Hx: The following portions of the patient's history were reviewed and updated as appropriate:             She  has a past medical history of ADHD, ADHD, Anxiety, COVID-19, Diabetes mellitus type 1 (HCC), Headache, History of cesarean section (09/01/2016), History of chlamydia infection (09/01/2016), PONV (postoperative nausea and vomiting), and STD (sexually transmitted disease). She does not have any pertinent problems on file. She  has a past surgical history that includes Cesarean section; Toe Surgery (Right, 2020); Wisdom tooth extraction (Bilateral); and Cesarean section (Bilateral, 03/27/2020). Her family history includes Anxiety disorder in her sister; Asthma in her sister; Depression in her mother and sister; Hypertension in her father and mother; Multiple sclerosis in her sister. She  reports that she has never smoked. She has never used smokeless tobacco. She reports previous alcohol use. She reports that she does not use drugs. She has a current medication list which includes the following prescription(s): glucagon, glucose blood, hydroxyzine, insulin aspart, prenatal multivitamin, and levonorgestrel-ethinyl estradiol. She is allergic to amoxicillin and penicillins.       Review of Systems:  Review of Systems  Constitutional: Denied constitutional symptoms, night sweats, recent illness, fatigue, fever, insomnia and weight loss.   Eyes: Denied eye symptoms, eye pain, photophobia, vision change and visual disturbance.  Ears/Nose/Throat/Neck: Denied ear, nose, throat or neck symptoms, hearing loss, nasal discharge, sinus congestion and sore throat.  Cardiovascular: Denied cardiovascular symptoms, arrhythmia, chest pain/pressure, edema, exercise intolerance, orthopnea and palpitations.  Respiratory: Denied pulmonary symptoms, asthma, pleuritic pain, productive sputum, cough, dyspnea and wheezing.  Gastrointestinal: Denied, gastro-esophageal reflux, melena, nausea and vomiting.  Genitourinary: Denied genitourinary symptoms including symptomatic vaginal discharge, pelvic relaxation issues, and urinary complaints.  Musculoskeletal: Denied musculoskeletal symptoms, stiffness, swelling, muscle weakness and myalgia.  Dermatologic: Denied dermatology symptoms, rash and scar.  Neurologic: Denied neurology symptoms, dizziness, headache, neck pain and syncope.  Psychiatric: Denied psychiatric symptoms, anxiety and depression.  Endocrine: Denied endocrine symptoms including hot flashes and night sweats.   Meds:   Current Outpatient Medications on File Prior to Visit  Medication Sig Dispense Refill  . glucagon (GLUCAGON EMERGENCY) 1 MG injection Inject 1 mg into the skin once as needed.     Marland Kitchen glucose blood (FREESTYLE PRECISION NEO TEST) test strip Use to monitor glucose levels 8 times per day; E11.9 100 each 12  . hydrOXYzine (ATARAX/VISTARIL) 25 MG tablet Take 0.5-1 tablets (12.5-25 mg total) by mouth daily as needed. Only for severe panic attacks 90 tablet 1  . insulin aspart (NOVOLOG) 100 UNIT/ML injection USE VIA INSULIN PUMP. MAX OF 80 UNITS PER DAY. (Patient taking differently: Inject 80 Units into the skin continuous. USE VIA INSULIN PUMP. MAX OF 80 UNITS PER DAY.) 90 mL 3  . Prenatal Vit-Fe Fumarate-FA (PRENATAL MULTIVITAMIN) TABS tablet Take 1 tablet by mouth daily at  12 noon.     No current facility-administered medications  on file prior to visit.    Objective:     Vitals:   05/06/20 1414  BP: 106/75  Pulse: 94               Abdomen: Soft.  Non-tender.  No masses.  No HSM.  Incision/s: Intact.  Healing well.  No erythema.  No drainage.   Physical examination   Pelvic:   Vulva: Normal appearance.  No lesions.  Vagina: No lesions or abnormalities noted.  Support: Normal pelvic support.  Urethra No masses tenderness or scarring.  Meatus Normal size without lesions or prolapse.  Cervix: Normal appearance.  No lesions.  Anus: Normal exam.  No lesions.  Perineum: Normal exam.  No lesions.        Bimanual   Uterus: Normal size.  Non-tender.  Mobile.  AV.  Adnexae: No masses.  Non-tender to palpation.  Cul-de-sac: Negative for abnormality.      Assessment:    [id number] Patient Active Problem List   Diagnosis Date Noted  . Postoperative state 03/27/2020  . Nausea and vomiting during pregnancy 02/29/2020  . Diabetes mellitus in pregnancy in third trimester 01/09/2020  . Pregnancy affected by fetal growth restriction 01/09/2020  . Panic attacks 08/19/2019  . Attention deficit hyperactivity disorder (ADHD), predominantly inattentive type 07/10/2019  . GAD (generalized anxiety disorder) 03/05/2019  . Recurrent major depressive disorder (HCC) 03/05/2019  . Toe pain, left 08/15/2018  . Breast cyst, right 03/07/2017  . History of cesarean delivery 09/01/2016  . Obesity (BMI 30.0-34.9) 09/01/2016  . Attention deficit hyperactivity disorder (ADHD), combined type 09/01/2016  . Presence of insulin pump 04/20/2015  . Type 1 diabetes mellitus (HCC) 09/24/1999     1. Post-operative state   2. Postpartum care and examination immediately after delivery   3. Birth control counseling   4. Initiation of OCP (BCP)        Plan:            1.  Patient with excellent recovery postop.-May return to work and resume normal activities.  2.  Birth Control I discussed multiple birth control options and  methods with the patient.  The risks and benefits of each were reviewed. Patient has chosen long-term OCP use (Seasonique) OCPs The risks /benefits of OCPs have been explained to the patient in detail.  Product literature has been given to her where appropriate.  I have instructed her in the use of OCPs.  I have explained to the patient that OCPs are not as effective for birth control during the first month of use, and that another form of contraception should be used during this time.  Both first-day start and Sunday start have been explained.  The risks and benefits of each was discussed.  She has been made aware of  the fact that in rare circumstances, other medications may affect the efficacy of OCPs.  I have answered all of her questions, and I believe that she has an understanding of the effectiveness and use of OCPs.  Orders No orders of the defined types were placed in this encounter.    Meds ordered this encounter  Medications  . Levonorgestrel-Ethinyl Estradiol (AMETHIA) 0.15-0.03 &0.01 MG tablet    Sig: Take 1 tablet by mouth at bedtime.    Dispense:  84 tablet    Refill:  1      F/U  Return in about 5 months (around 10/06/2020) for Annual Physical.  Onalee Hua  [place]ne [place], M.D. 05/06/2020 3:15 PM

## 2020-05-07 ENCOUNTER — Encounter: Payer: BC Managed Care – PPO | Admitting: Obstetrics and Gynecology

## 2020-06-22 ENCOUNTER — Other Ambulatory Visit: Payer: Self-pay

## 2020-06-22 ENCOUNTER — Encounter: Payer: Self-pay | Admitting: Endocrinology

## 2020-06-22 ENCOUNTER — Ambulatory Visit (INDEPENDENT_AMBULATORY_CARE_PROVIDER_SITE_OTHER): Payer: BC Managed Care – PPO | Admitting: Endocrinology

## 2020-06-22 VITALS — BP 114/70 | HR 86 | Ht 60.0 in | Wt 186.4 lb

## 2020-06-22 DIAGNOSIS — E109 Type 1 diabetes mellitus without complications: Secondary | ICD-10-CM

## 2020-06-22 LAB — POCT GLYCOSYLATED HEMOGLOBIN (HGB A1C): Hemoglobin A1C: 6 % — AB (ref 4.0–5.6)

## 2020-06-22 NOTE — Progress Notes (Signed)
Subjective:    Patient ID: Jordan Leonard, female    DOB: March 14, 1991, 29 y.o.   MRN: [id number]  HPI Pt returns for f/u of diabetes mellitus:  DM type: 1 Dx'ed: 2000 Complications: none Therapy: insulin since dx, and pump rx since 2005 (Tandem pump and Dexcom G6 continuous glucose monitor since 2020).   GDM: never.  DKA: only once, at dx Severe hypoglycemia: never.  Pancreatitis: never Pancreatic imaging: never.  Other: she eats meals at 8 AM, noon, and 6 PM; she works from home now.   Interval history:  She takes these pump settings:  basal rate of 1.3 units/hr, 6AM-9PM, and 1.7/HR overnight.  mealtime bolus of 1 unit/5 grams carbohydrate at all times of day.   correction bolus (which some people call "sensitivity," or "insulin sensitivity ratio," or just "isr") of 1 unit for each 50 by which your glucose exceeds 120.  continuous glucose monitor is downloaded today, and the printout is scanned into the record.  glucose varies from 55-330, but average is 127.  It is in general lowest in the middle of the night, and in the afternoon, but there is little trend throughout the day.  TDD is 55 units (57% basal).  Pt says she eats just 2 meals per day.    She has mild hypoglycemia approx once every 2 weeks.   She is 3 months postpartum.  She is no longer breast feeding.   Past Medical History:  Diagnosis Date  . ADHD   . ADHD   . Anxiety   . COVID-19   . Diabetes mellitus type 1 (HCC)    Type 1  . Headache    Hormonal Migraines   . History of cesarean section 09/01/2016  . History of chlamydia infection 09/01/2016   2007  . PONV (postoperative nausea and vomiting)   . STD (sexually transmitted disease)    chlaymdia- 10 years ago    Past Surgical History:  Procedure Laterality Date  . CESAREAN SECTION    . CESAREAN SECTION Bilateral 03/27/2020   Procedure: CESAREAN SECTION;  Surgeon: Linzie Collin, MD;  Location: [facility] ORS;  Service: Obstetrics;  Laterality:  Bilateral;  Repeat C-Section  . TOE SURGERY Right 2020  . WISDOM TOOTH EXTRACTION Bilateral    Highschool    Social History   Socioeconomic History  . Marital status: Married    Spouse name: Jill Alexanders  . Number of children: 1  . Years of education: Not on file  . Highest education level: Not on file  Occupational History  . Not on file  Tobacco Use  . Smoking status: Never Smoker  . Smokeless tobacco: Never Used  Vaping Use  . Vaping Use: Never used  Substance and Sexual Activity  . Alcohol use: Not Currently    Comment: rare  . Drug use: No  . Sexual activity: Yes  Other Topics Concern  . Not on file  Social History Narrative   Married.    1 child.   Works at [employer].   Enjoys being with family, working on mind puzzles   Social Determinants of Corporate investment banker Strain:   . Difficulty of Paying Living Expenses: Not on file  Food Insecurity:   . Worried About Programme researcher, broadcasting/film/video in the Last Year: Not on file  . Ran Out of Food in the Last Year: Not on file  Transportation Needs:   . Lack of Transportation (Medical): Not on file  . Lack of  Transportation (Non-Medical): Not on file  Physical Activity:   . Days of Exercise per Week: Not on file  . Minutes of Exercise per Session: Not on file  Stress:   . Feeling of Stress : Not on file  Social Connections:   . Frequency of Communication with Friends and Family: Not on file  . Frequency of Social Gatherings with Friends and Family: Not on file  . Attends Religious Services: Not on file  . Active Member of Clubs or Organizations: Not on file  . Attends Banker Meetings: Not on file  . Marital Status: Not on file  Intimate Partner Violence:   . Fear of Current or Ex-Partner: Not on file  . Emotionally Abused: Not on file  . Physically Abused: Not on file  . Sexually Abused: Not on file    Current Outpatient Medications on File Prior to Visit  Medication Sig Dispense Refill  .  glucagon (GLUCAGON EMERGENCY) 1 MG injection Inject 1 mg into the skin once as needed.     Marland Kitchen glucose blood (FREESTYLE PRECISION NEO TEST) test strip Use to monitor glucose levels 8 times per day; E11.9 100 each 12  . hydrOXYzine (ATARAX/VISTARIL) 25 MG tablet Take 0.5-1 tablets (12.5-25 mg total) by mouth daily as needed. Only for severe panic attacks 90 tablet 1  . insulin aspart (NOVOLOG) 100 UNIT/ML injection USE VIA INSULIN PUMP. MAX OF 80 UNITS PER DAY. (Patient taking differently: Inject 80 Units into the skin continuous. USE VIA INSULIN PUMP. MAX OF 80 UNITS PER DAY.) 90 mL 3  . Levonorgestrel-Ethinyl Estradiol (AMETHIA) 0.15-0.03 &0.01 MG tablet Take 1 tablet by mouth at bedtime. 84 tablet 1  . Prenatal Vit-Fe Fumarate-FA (PRENATAL MULTIVITAMIN) TABS tablet Take 1 tablet by mouth daily at 12 noon.     No current facility-administered medications on file prior to visit.    Allergies  Allergen Reactions  . Amoxicillin Anaphylaxis  . Penicillins Anaphylaxis    Family History  Problem Relation Age of Onset  . Asthma Sister   . Multiple sclerosis Sister   . Anxiety disorder Sister   . Depression Sister   . Depression Mother   . Hypertension Mother   . Hypertension Father   . Breast cancer Neg Hx   . Ovarian cancer Neg Hx   . Colon cancer Neg Hx   . Diabetes Neg Hx   . Heart disease Neg Hx   . Diabetes gravidarum Neg Hx   . Diabetes type I Neg Hx     BP 114/70   Pulse 86   Ht 5' (1.524 m)   Wt 186 lb 6.4 oz (84.6 kg)   SpO2 99%   BMI 36.40 kg/m    Review of Systems Denies LOC    Objective:   Physical Exam VITAL SIGNS:  See vs page GENERAL: no distress Pulses: dorsalis pedis intact bilat.   MSK: no deformity of the feet CV: no leg edema Skin:  no ulcer on the feet.  normal color and temp on the feet. Neuro: sensation is intact to touch on the feet   Lab Results  Component Value Date   HGBA1C 6.0 (A) 06/22/2020        Assessment & Plan:  Type 1  DM Hypoglycemia, due to insulin: this limits aggressiveness of glycemic control   Patient Instructions  Please continue these settings:  basal rate of 1.2 units/hr, 6AM-9PM, and 1.5/HR overnight.  mealtime bolus of 1 unit/4 grams carbohydrate at all times of  day.   correction bolus (which some people call "sensitivity," or "insulin sensitivity ratio," or just "isr") of 1 unit for each 50 by which your glucose exceeds 120.   Please come back for a follow-up appointment in 2-3 months.

## 2020-06-22 NOTE — Patient Instructions (Addendum)
Please continue these settings:  basal rate of 1.2 units/hr, 6AM-9PM, and 1.5/HR overnight.  mealtime bolus of 1 unit/4 grams carbohydrate at all times of day.   correction bolus (which some people call "sensitivity," or "insulin sensitivity ratio," or just "isr") of 1 unit for each 50 by which your glucose exceeds 120.   Please come back for a follow-up appointment in 2-3 months.

## 2020-06-30 DIAGNOSIS — E109 Type 1 diabetes mellitus without complications: Secondary | ICD-10-CM | POA: Diagnosis not present

## 2020-06-30 DIAGNOSIS — Z794 Long term (current) use of insulin: Secondary | ICD-10-CM | POA: Diagnosis not present

## 2020-06-30 DIAGNOSIS — E108 Type 1 diabetes mellitus with unspecified complications: Secondary | ICD-10-CM | POA: Diagnosis not present

## 2020-06-30 LAB — HM DIABETES EYE EXAM

## 2020-07-15 ENCOUNTER — Other Ambulatory Visit: Payer: Self-pay

## 2020-07-15 ENCOUNTER — Ambulatory Visit (INDEPENDENT_AMBULATORY_CARE_PROVIDER_SITE_OTHER): Payer: BC Managed Care – PPO | Admitting: Obstetrics and Gynecology

## 2020-07-15 ENCOUNTER — Encounter: Payer: Self-pay | Admitting: Obstetrics and Gynecology

## 2020-07-15 VITALS — BP 122/82 | HR 85 | Ht 60.0 in | Wt 188.1 lb

## 2020-07-15 DIAGNOSIS — Z3009 Encounter for other general counseling and advice on contraception: Secondary | ICD-10-CM

## 2020-07-15 NOTE — Progress Notes (Signed)
HPI:      [name] is a 29 y.o. [phone] who LMP was No LMP recorded (lmp unknown).  Subjective:   She presents today to discuss birth control methods.  She was taking Seasonique and in the middle of her first pack she began to have significant depression.  She states that she had depression before and she did not want to go to the extent of depression she had been.  She discontinued her OCPs and since that time her depression has gradually improved.  She reports that she continues to get better. She previously had Mirena for birth control and is now requesting Mirena again. She reports that her sugars are well controlled with her most recent A1c at 6.    Hx: The following portions of the patient's history were reviewed and updated as appropriate:             She  has a past medical history of ADHD, ADHD, Anxiety, COVID-19, Diabetes mellitus type 1 (HCC), Headache, History of cesarean section (09/01/2016), History of chlamydia infection (09/01/2016), PONV (postoperative nausea and vomiting), and STD (sexually transmitted disease). She does not have any pertinent problems on file. She  has a past surgical history that includes Cesarean section; Toe Surgery (Right, 2020); Wisdom tooth extraction (Bilateral); and Cesarean section (Bilateral, 03/27/2020). Her family history includes Anxiety disorder in her sister; Asthma in her sister; Depression in her mother and sister; Hypertension in her father and mother; Multiple sclerosis in her sister. She  reports that she has never smoked. She has never used smokeless tobacco. She reports previous alcohol use. She reports that she does not use drugs. She has a current medication list which includes the following prescription(s): glucagon, glucose blood, hydroxyzine, insulin aspart, and prenatal multivitamin. She is allergic to amoxicillin and penicillins.       Review of Systems:  Review of Systems  Constitutional: Denied constitutional  symptoms, night sweats, recent illness, fatigue, fever, insomnia and weight loss.  Eyes: Denied eye symptoms, eye pain, photophobia, vision change and visual disturbance.  Ears/Nose/Throat/Neck: Denied ear, nose, throat or neck symptoms, hearing loss, nasal discharge, sinus congestion and sore throat.  Cardiovascular: Denied cardiovascular symptoms, arrhythmia, chest pain/pressure, edema, exercise intolerance, orthopnea and palpitations.  Respiratory: Denied pulmonary symptoms, asthma, pleuritic pain, productive sputum, cough, dyspnea and wheezing.  Gastrointestinal: Denied, gastro-esophageal reflux, melena, nausea and vomiting.  Genitourinary: Denied genitourinary symptoms including symptomatic vaginal discharge, pelvic relaxation issues, and urinary complaints.  Musculoskeletal: Denied musculoskeletal symptoms, stiffness, swelling, muscle weakness and myalgia.  Dermatologic: Denied dermatology symptoms, rash and scar.  Neurologic: Denied neurology symptoms, dizziness, headache, neck pain and syncope.  Psychiatric: Denied psychiatric symptoms, anxiety and depression.  Endocrine: Denied endocrine symptoms including hot flashes and night sweats.   Meds:   Current Outpatient Medications on File Prior to Visit  Medication Sig Dispense Refill  . glucagon (GLUCAGON EMERGENCY) 1 MG injection Inject 1 mg into the skin once as needed.     Marland Kitchen glucose blood (FREESTYLE PRECISION NEO TEST) test strip Use to monitor glucose levels 8 times per day; E11.9 100 each 12  . hydrOXYzine (ATARAX/VISTARIL) 25 MG tablet Take 0.5-1 tablets (12.5-25 mg total) by mouth daily as needed. Only for severe panic attacks 90 tablet 1  . insulin aspart (NOVOLOG) 100 UNIT/ML injection USE VIA INSULIN PUMP. MAX OF 80 UNITS PER DAY. (Patient taking differently: Inject 80 Units into the skin continuous. USE VIA INSULIN PUMP. MAX OF 80 UNITS PER DAY.) 90 mL  3  . Prenatal Vit-Fe Fumarate-FA (PRENATAL MULTIVITAMIN) TABS tablet Take 1  tablet by mouth daily at 12 noon.     No current facility-administered medications on file prior to visit.       The pregnancy intention screening data noted above was reviewed. Potential methods of contraception were discussed. The patient elected to proceed with IUD or IUS.     Objective:     Vitals:   07/15/20 1505  BP: 122/82  Pulse: 85   Filed Weights   07/15/20 1505  Weight: 188 lb 1.6 oz (85.3 kg)                Assessment:    [id number] Patient Active Problem List   Diagnosis Date Noted  . Postoperative state 03/27/2020  . Nausea and vomiting during pregnancy 02/29/2020  . Diabetes mellitus in pregnancy in third trimester 01/09/2020  . Pregnancy affected by fetal growth restriction 01/09/2020  . Panic attacks 08/19/2019  . Attention deficit hyperactivity disorder (ADHD), predominantly inattentive type 07/10/2019  . GAD (generalized anxiety disorder) 03/05/2019  . Recurrent major depressive disorder (HCC) 03/05/2019  . Toe pain, left 08/15/2018  . Breast cyst, right 03/07/2017  . History of cesarean delivery 09/01/2016  . Obesity (BMI 30.0-34.9) 09/01/2016  . Attention deficit hyperactivity disorder (ADHD), combined type 09/01/2016  . Presence of insulin pump 04/20/2015  . Type 1 diabetes mellitus (HCC) 09/24/1999     1. Birth control counseling     Patient discontinued her OCPs because she was having a depressive episode.  She was now doing much better.  No thoughts of self-harm.   Plan:            1.  Birth Control I discussed multiple birth control options and methods with the patient.  The risks and benefits of each were reviewed. IUD Literature on IUD made available.  Risks and benefits discussed.  She is considering IUD as an option for birth/cycle control. Patient desires Mirena for birth control she would like it placed as soon as possible.  We will schedule it in the next few days.  Orders No orders of the defined types were placed in this  encounter.   No orders of the defined types were placed in this encounter.     F/U  Return in about 5 days (around 07/20/2020). I spent 18 minutes involved in the care of this patient preparing to see the patient by obtaining and reviewing her medical history (including labs, imaging tests and prior procedures), documenting clinical information in the electronic health record (EHR), counseling and coordinating care plans, writing and sending prescriptions, ordering tests or procedures and directly communicating with the patient by discussing pertinent items from her history and physical exam as well as detailing my assessment and plan as noted above so that she has an informed understanding.  All of her questions were answered.  Elonda Husky, M.D. 07/15/2020 3:32 PM

## 2020-07-17 ENCOUNTER — Encounter: Payer: Self-pay | Admitting: Surgical

## 2020-07-21 ENCOUNTER — Other Ambulatory Visit: Payer: Self-pay

## 2020-07-21 ENCOUNTER — Encounter: Payer: Self-pay | Admitting: Obstetrics and Gynecology

## 2020-07-21 ENCOUNTER — Ambulatory Visit (INDEPENDENT_AMBULATORY_CARE_PROVIDER_SITE_OTHER): Payer: BC Managed Care – PPO | Admitting: Obstetrics and Gynecology

## 2020-07-21 VITALS — BP 138/85 | HR 93 | Ht 60.0 in | Wt 188.2 lb

## 2020-07-21 DIAGNOSIS — Z30430 Encounter for insertion of intrauterine contraceptive device: Secondary | ICD-10-CM | POA: Diagnosis not present

## 2020-07-21 LAB — POCT URINE PREGNANCY: Preg Test, Ur: NEGATIVE

## 2020-07-21 NOTE — Progress Notes (Signed)
HPI:      [name] is a 29 y.o. [phone] who LMP was No LMP recorded (lmp unknown).  Subjective:   She presents today for IUD insertion.  We discussed IUDs at her last visit and she has previously had a Mirena IUD.  She would like to use it for birth control.    Hx: The following portions of the patient's history were reviewed and updated as appropriate:             She  has a past medical history of ADHD, ADHD, Anxiety, COVID-19, Diabetes mellitus type 1 (HCC), Headache, History of cesarean section (09/01/2016), History of chlamydia infection (09/01/2016), PONV (postoperative nausea and vomiting), and STD (sexually transmitted disease). She does not have any pertinent problems on file. She  has a past surgical history that includes Cesarean section; Toe Surgery (Right, 2020); Wisdom tooth extraction (Bilateral); and Cesarean section (Bilateral, 03/27/2020). Her family history includes Anxiety disorder in her sister; Asthma in her sister; Depression in her mother and sister; Hypertension in her father and mother; Multiple sclerosis in her sister. She  reports that she has never smoked. She has never used smokeless tobacco. She reports previous alcohol use. She reports that she does not use drugs. She has a current medication list which includes the following prescription(s): glucagon, glucose blood, hydroxyzine, insulin aspart, levonorgestrel, and prenatal multivitamin. She is allergic to amoxicillin and penicillins.       Review of Systems:  Review of Systems  Constitutional: Denied constitutional symptoms, night sweats, recent illness, fatigue, fever, insomnia and weight loss.  Eyes: Denied eye symptoms, eye pain, photophobia, vision change and visual disturbance.  Ears/Nose/Throat/Neck: Denied ear, nose, throat or neck symptoms, hearing loss, nasal discharge, sinus congestion and sore throat.  Cardiovascular: Denied cardiovascular symptoms, arrhythmia, chest pain/pressure,  edema, exercise intolerance, orthopnea and palpitations.  Respiratory: Denied pulmonary symptoms, asthma, pleuritic pain, productive sputum, cough, dyspnea and wheezing.  Gastrointestinal: Denied, gastro-esophageal reflux, melena, nausea and vomiting.  Genitourinary: Denied genitourinary symptoms including symptomatic vaginal discharge, pelvic relaxation issues, and urinary complaints.  Musculoskeletal: Denied musculoskeletal symptoms, stiffness, swelling, muscle weakness and myalgia.  Dermatologic: Denied dermatology symptoms, rash and scar.  Neurologic: Denied neurology symptoms, dizziness, headache, neck pain and syncope.  Psychiatric: Denied psychiatric symptoms, anxiety and depression.  Endocrine: Denied endocrine symptoms including hot flashes and night sweats.   Meds:   Current Outpatient Medications on File Prior to Visit  Medication Sig Dispense Refill  . glucagon (GLUCAGON EMERGENCY) 1 MG injection Inject 1 mg into the skin once as needed.     Marland Kitchen glucose blood (FREESTYLE PRECISION NEO TEST) test strip Use to monitor glucose levels 8 times per day; E11.9 100 each 12  . hydrOXYzine (ATARAX/VISTARIL) 25 MG tablet Take 0.5-1 tablets (12.5-25 mg total) by mouth daily as needed. Only for severe panic attacks 90 tablet 1  . insulin aspart (NOVOLOG) 100 UNIT/ML injection USE VIA INSULIN PUMP. MAX OF 80 UNITS PER DAY. (Patient taking differently: Inject 80 Units into the skin continuous. USE VIA INSULIN PUMP. MAX OF 80 UNITS PER DAY.) 90 mL 3  . levonorgestrel (MIRENA) 20 MCG/24HR IUD 1 each by Intrauterine route once. Inserted 07/21/2020    . Prenatal Vit-Fe Fumarate-FA (PRENATAL MULTIVITAMIN) TABS tablet Take 1 tablet by mouth daily at 12 noon.     No current facility-administered medications on file prior to visit.    Objective:     Vitals:   07/21/20 1459  BP: 138/85  Pulse: 93  Physical examination   Pelvic:   Vulva: Normal appearance.  No lesions.  Vagina: No lesions or  abnormalities noted.  Support: Normal pelvic support.  Urethra No masses tenderness or scarring.  Meatus Normal size without lesions or prolapse.  Cervix: Normal appearance.  No lesions.  Anus: Normal exam.  No lesions.  Perineum: Normal exam.  No lesions.        Bimanual   Uterus: Normal size.  Non-tender.  Mobile.  AV.  Adnexae: No masses.  Non-tender to palpation.  Cul-de-sac: Negative for abnormality.   IUD Procedure Pt has read the booklet and signed the appropriate forms regarding the Mirena IUD.  All of her questions have been answered.   The cervix was cleansed with betadine solution.  After sounding the uterus and noting the position, the IUD was placed in the usual manner without problem.  The string was cut to the appropriate length.  The patient tolerated the procedure well.            [employer] # = [id number]   Assessment:    [id number] Patient Active Problem List   Diagnosis Date Noted  . Postoperative state 03/27/2020  . Nausea and vomiting during pregnancy 02/29/2020  . Diabetes mellitus in pregnancy in third trimester 01/09/2020  . Pregnancy affected by fetal growth restriction 01/09/2020  . Panic attacks 08/19/2019  . Attention deficit hyperactivity disorder (ADHD), predominantly inattentive type 07/10/2019  . GAD (generalized anxiety disorder) 03/05/2019  . Recurrent major depressive disorder (HCC) 03/05/2019  . Toe pain, left 08/15/2018  . Breast cyst, right 03/07/2017  . History of cesarean delivery 09/01/2016  . Obesity (BMI 30.0-34.9) 09/01/2016  . Attention deficit hyperactivity disorder (ADHD), combined type 09/01/2016  . Presence of insulin pump 04/20/2015  . Type 1 diabetes mellitus (HCC) 09/24/1999     1. Encounter for insertion of mirena IUD       Plan:             F/U  Return in about 4 weeks (around 08/18/2020) for For IUD f/u.  Elonda Husky, M.D. 07/21/2020 3:29 PM

## 2020-07-21 NOTE — Progress Notes (Signed)
Pt present for IUD insertion of Mirena. Pt stated that she was doing well. UPT -NEG.

## 2020-08-19 ENCOUNTER — Ambulatory Visit (INDEPENDENT_AMBULATORY_CARE_PROVIDER_SITE_OTHER): Payer: BC Managed Care – PPO | Admitting: Obstetrics and Gynecology

## 2020-08-19 ENCOUNTER — Encounter: Payer: Self-pay | Admitting: Obstetrics and Gynecology

## 2020-08-19 ENCOUNTER — Other Ambulatory Visit: Payer: Self-pay

## 2020-08-19 VITALS — BP 119/88 | HR 93 | Ht 60.0 in | Wt 188.9 lb

## 2020-08-19 DIAGNOSIS — Z23 Encounter for immunization: Secondary | ICD-10-CM

## 2020-08-19 DIAGNOSIS — Z30431 Encounter for routine checking of intrauterine contraceptive device: Secondary | ICD-10-CM

## 2020-08-19 NOTE — Progress Notes (Addendum)
HPI:      [name] is a 29 y.o. [phone] who LMP was No LMP recorded. (Menstrual status: IUD).  Subjective:   She presents today for IUD follow-up.  She has had some spotting with her IUD but generally has had no problems.  She thinks the strings may be just a little bit too long. She would like to get a flu shot today.    Hx: The following portions of the patient's history were reviewed and updated as appropriate:             She  has a past medical history of ADHD, ADHD, Anxiety, COVID-19, Diabetes mellitus type 1 (HCC), Headache, History of cesarean section (09/01/2016), History of chlamydia infection (09/01/2016), PONV (postoperative nausea and vomiting), and STD (sexually transmitted disease). She does not have any pertinent problems on file. She  has a past surgical history that includes Cesarean section; Toe Surgery (Right, 2020); Wisdom tooth extraction (Bilateral); and Cesarean section (Bilateral, 03/27/2020). Her family history includes Anxiety disorder in her sister; Asthma in her sister; Depression in her mother and sister; Hypertension in her father and mother; Multiple sclerosis in her sister. She  reports that she has never smoked. She has never used smokeless tobacco. She reports previous alcohol use. She reports that she does not use drugs. She has a current medication list which includes the following prescription(s): glucagon, glucose blood, hydroxyzine, insulin aspart, levonorgestrel, and prenatal multivitamin. She is allergic to amoxicillin and penicillins.       Review of Systems:  Review of Systems  Constitutional: Denied constitutional symptoms, night sweats, recent illness, fatigue, fever, insomnia and weight loss.  Eyes: Denied eye symptoms, eye pain, photophobia, vision change and visual disturbance.  Ears/Nose/Throat/Neck: Denied ear, nose, throat or neck symptoms, hearing loss, nasal discharge, sinus congestion and sore throat.  Cardiovascular: Denied  cardiovascular symptoms, arrhythmia, chest pain/pressure, edema, exercise intolerance, orthopnea and palpitations.  Respiratory: Denied pulmonary symptoms, asthma, pleuritic pain, productive sputum, cough, dyspnea and wheezing.  Gastrointestinal: Denied, gastro-esophageal reflux, melena, nausea and vomiting.  Genitourinary: Denied genitourinary symptoms including symptomatic vaginal discharge, pelvic relaxation issues, and urinary complaints.  Musculoskeletal: Denied musculoskeletal symptoms, stiffness, swelling, muscle weakness and myalgia.  Dermatologic: Denied dermatology symptoms, rash and scar.  Neurologic: Denied neurology symptoms, dizziness, headache, neck pain and syncope.  Psychiatric: Denied psychiatric symptoms, anxiety and depression.  Endocrine: Denied endocrine symptoms including hot flashes and night sweats.   Meds:   Current Outpatient Medications on File Prior to Visit  Medication Sig Dispense Refill  . glucagon (GLUCAGON EMERGENCY) 1 MG injection Inject 1 mg into the skin once as needed.     Marland Kitchen glucose blood (FREESTYLE PRECISION NEO TEST) test strip Use to monitor glucose levels 8 times per day; E11.9 100 each 12  . hydrOXYzine (ATARAX/VISTARIL) 25 MG tablet Take 0.5-1 tablets (12.5-25 mg total) by mouth daily as needed. Only for severe panic attacks 90 tablet 1  . insulin aspart (NOVOLOG) 100 UNIT/ML injection USE VIA INSULIN PUMP. MAX OF 80 UNITS PER DAY. (Patient taking differently: Inject 80 Units into the skin continuous. USE VIA INSULIN PUMP. MAX OF 80 UNITS PER DAY.) 90 mL 3  . levonorgestrel (MIRENA) 20 MCG/24HR IUD 1 each by Intrauterine route once. Inserted 07/21/2020    . Prenatal Vit-Fe Fumarate-FA (PRENATAL MULTIVITAMIN) TABS tablet Take 1 tablet by mouth daily at 12 noon.     No current facility-administered medications on file prior to visit.       The  pregnancy intention screening data noted above was reviewed. Potential methods of contraception were  discussed. The patient elected to proceed with IUD or IUS.     Objective:     Vitals:   08/19/20 1524  BP: 119/88  Pulse: 93   Filed Weights   08/19/20 1524  Weight: 188 lb 14.4 oz (85.7 kg)              Physical examination   Pelvic:   Vulva: Normal appearance.  No lesions.  Vagina: No lesions or abnormalities noted.  Support: Normal pelvic support.  Urethra No masses tenderness or scarring.  Meatus Normal size without lesions or prolapse.  Cervix: Normal appearance.  No lesions. IUD strings noted at cervical os.  Anus: Normal exam.  No lesions.  Perineum: Normal exam.  No lesions.        Bimanual   Uterus: Normal size.  Non-tender.  Mobile.  AV.  Adnexae: No masses.  Non-tender to palpation.  Cul-de-sac: Negative for abnormality.   Strings were inside the cervical os and teased out and shortened.  Assessment:    [id number] Patient Active Problem List   Diagnosis Date Noted  . Postoperative state 03/27/2020  . Nausea and vomiting during pregnancy 02/29/2020  . Diabetes mellitus in pregnancy in third trimester 01/09/2020  . Pregnancy affected by fetal growth restriction 01/09/2020  . Panic attacks 08/19/2019  . Attention deficit hyperactivity disorder (ADHD), predominantly inattentive type 07/10/2019  . GAD (generalized anxiety disorder) 03/05/2019  . Recurrent major depressive disorder (HCC) 03/05/2019  . Toe pain, left 08/15/2018  . Breast cyst, right 03/07/2017  . History of cesarean delivery 09/01/2016  . Obesity (BMI 30.0-34.9) 09/01/2016  . Attention deficit hyperactivity disorder (ADHD), combined type 09/01/2016  . Presence of insulin pump 04/20/2015  . Type 1 diabetes mellitus (HCC) 09/24/1999     1. Surveillance of previously prescribed intrauterine contraceptive device   2. Need for immunization against influenza     Patient doing well with IUD.   Plan:            1.  Follow-up for annual examination. Orders Orders Placed This Encounter   Procedures  . Flu Vaccine QUAD 36+ mos IM    No orders of the defined types were placed in this encounter.     F/U  Return for Annual Physical. I spent 21 minutes involved in the care of this patient preparing to see the patient by obtaining and reviewing her medical history (including labs, imaging tests and prior procedures), documenting clinical information in the electronic health record (EHR), counseling and coordinating care plans, writing and sending prescriptions, ordering tests or procedures and directly communicating with the patient by discussing pertinent items from her history and physical exam as well as detailing my assessment and plan as noted above so that she has an informed understanding.  All of her questions were answered.  Elonda Husky, M.D. 08/19/2020 3:49 PM

## 2020-08-22 IMAGING — US US MFM OB FOLLOW-UP
1 series · 14 of 28 positions shown · non-contrast
Comparison: none

PATIENT INFO:

PERFORMED BY:
SERVICE(S) PROVIDED:
 ----------------------------------------------------------------------
INDICATIONS:
  26 weeks gestation of pregnancy
FETAL EVALUATION:
 Num Of Fetuses:         1
BIOMETRY:
 BPD:      60.6  mm     G. Age:  24w 5d          3  %    CI:        66.37   %    70 - 86
                                                         FL/HC:      19.2   %    18.6 -
 HC:      238.5  mm     G. Age:  26w 0d         12  %    HC/AC:      1.17        1.04 -
 AC:      204.4  mm     G. Age:  25w 0d          9  %    FL/BPD:     75.6   %    71 - 87
 FL:       45.8  mm     G. Age:  25w 2d          9  %    FL/AC:      22.4   %    20 - 24
 HUM:      43.3  mm     G. Age:  25w 6d         32  %
 Est. FW:     776  gm    1 lb 11 oz       7  %
GESTATIONAL AGE:
 LMP:           26w 3d        Date:  07/08/19                 EDD:   04/13/20
 U/S Today:     25w 2d                                        EDD:   04/21/20
 Best:          26w 3d     Det. By:  LMP  (07/08/19)          EDD:   04/13/20
DOPPLER - FETAL VESSELS:
 Umbilical Artery
  S/D     %tile
 4.42       92

[Series 1: us mfm ob follow-up · 41 acquisitions, 14 frames shown]
[im 2/41]
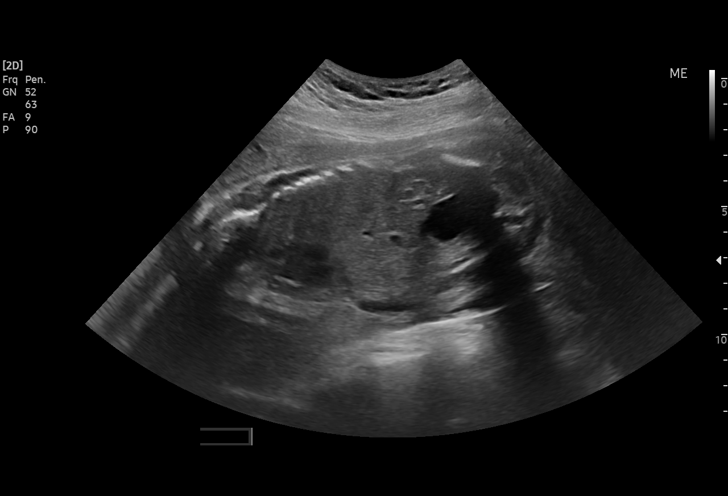
[im 5/41]
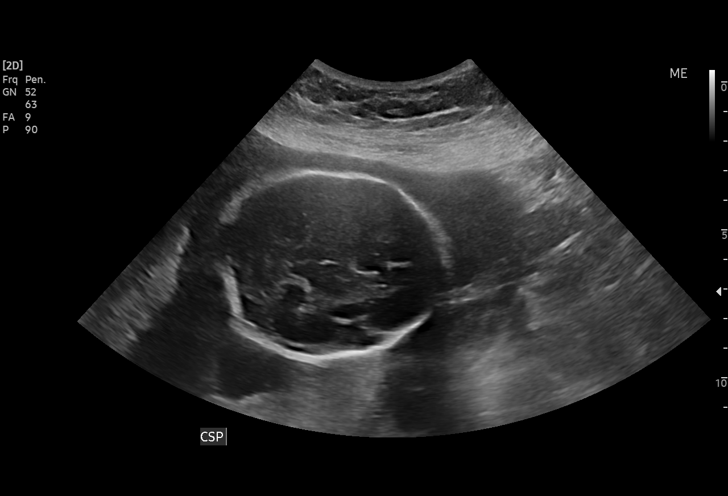
[im 8/41]
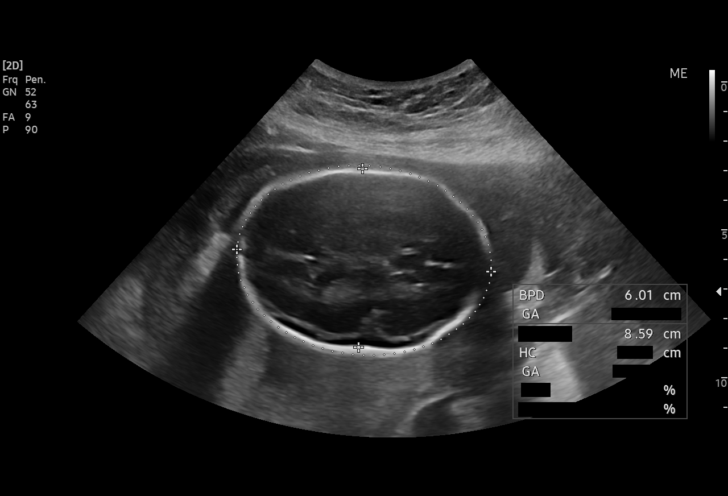
[im 11/41]
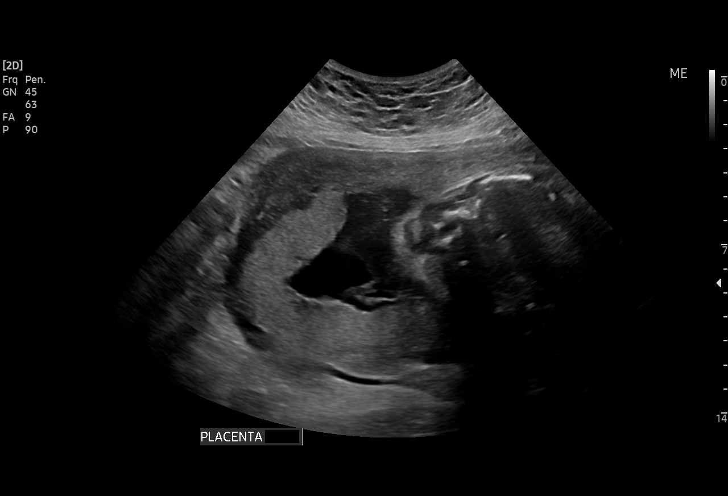
[im 14/41]
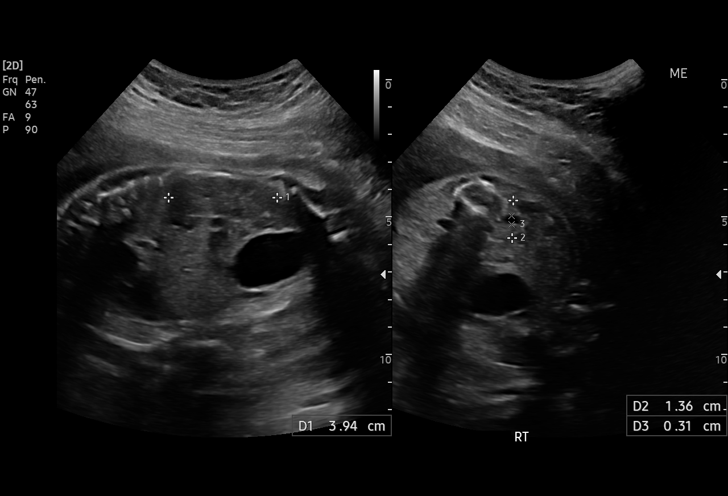
[im 17/41]
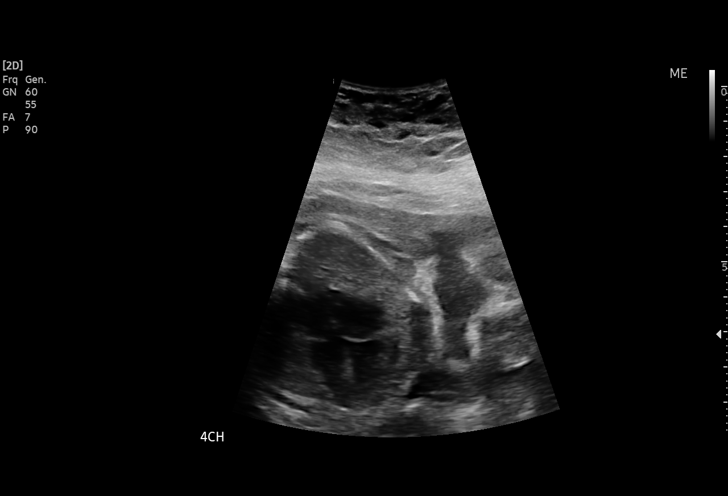
[im 20/41]
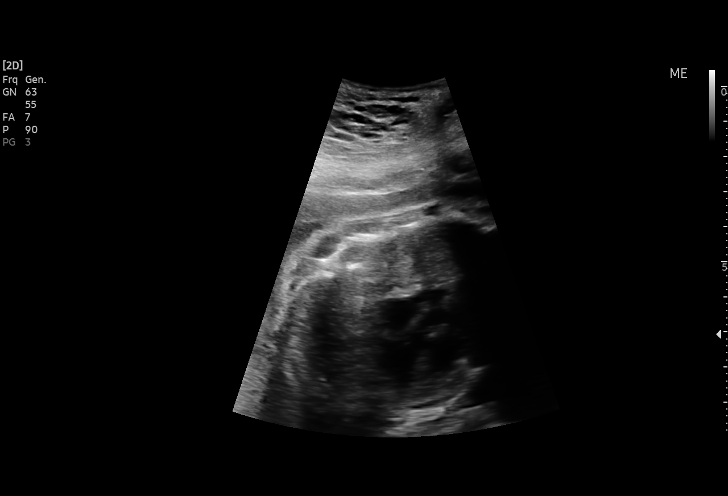
[im 23/41]
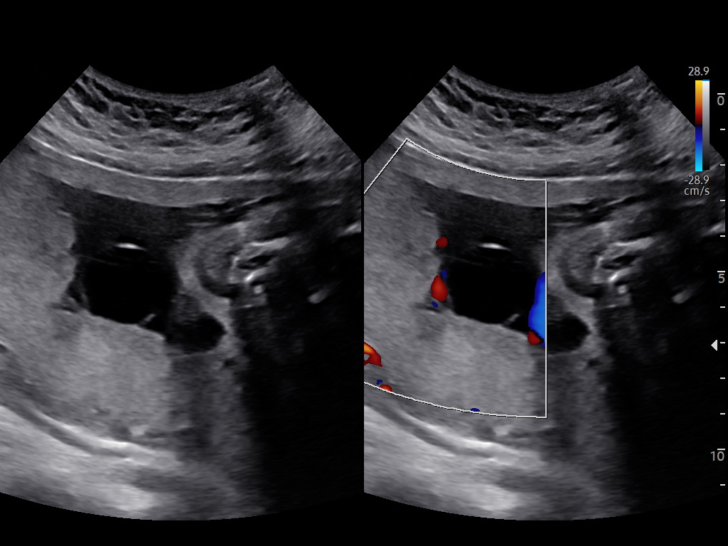
[im 26/41]
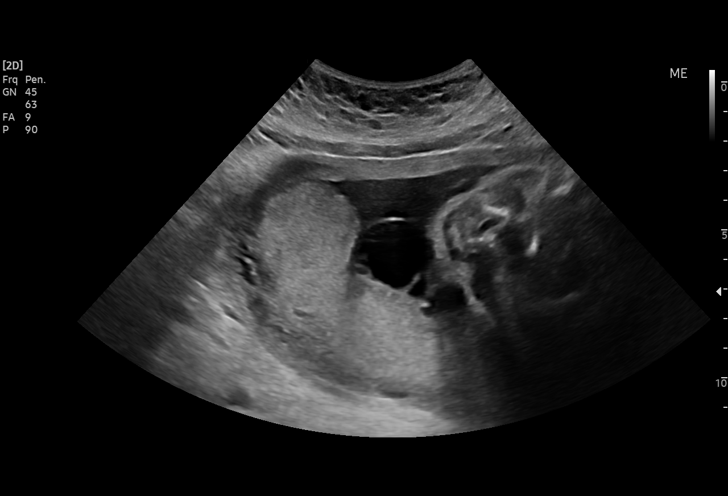
[im 29/41]
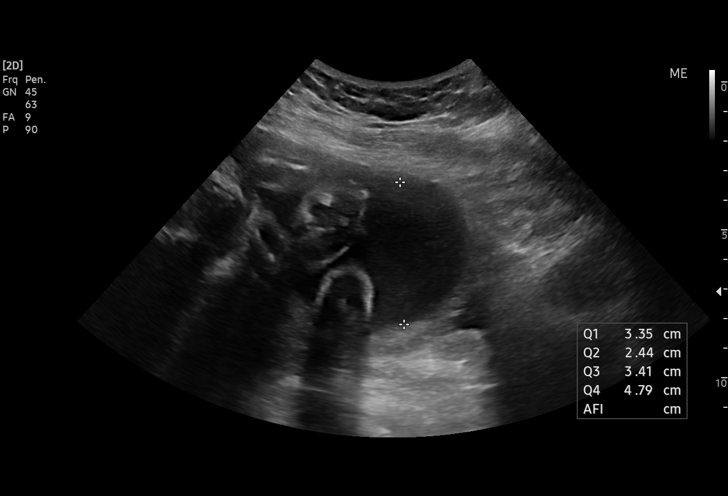
[im 32/41]
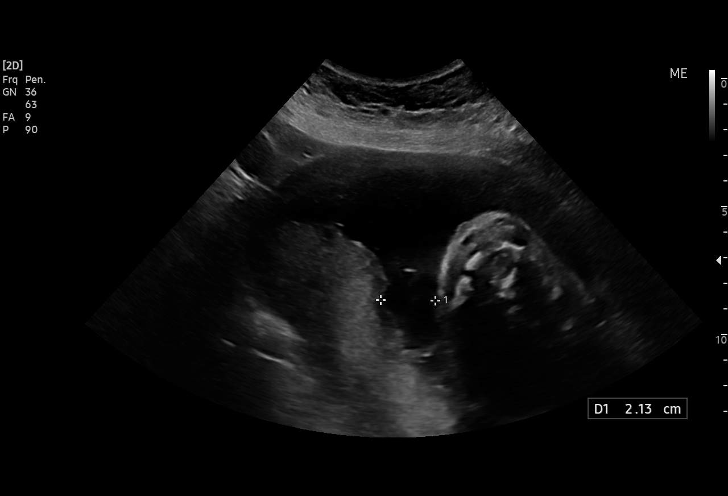
[im 35/41]
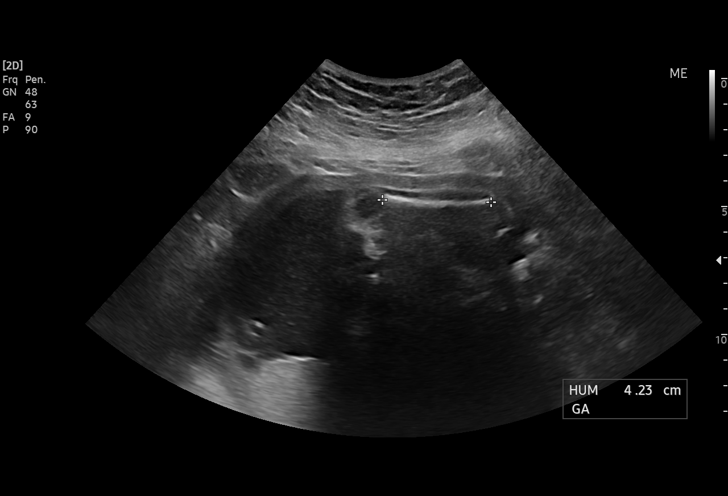
[im 38/41]
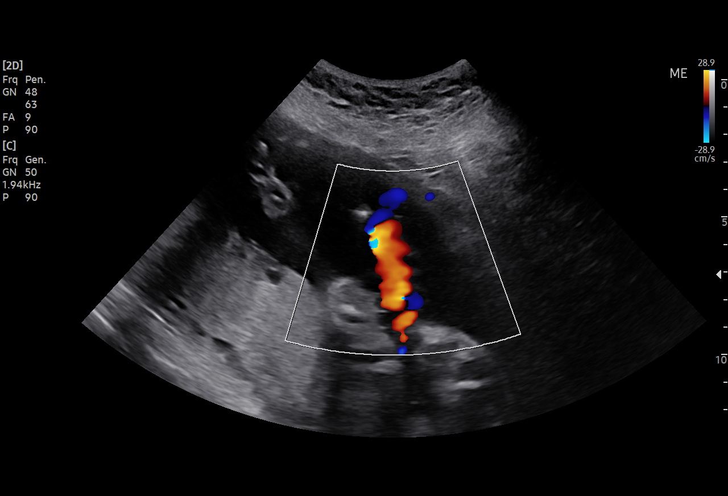
[im 41/41]
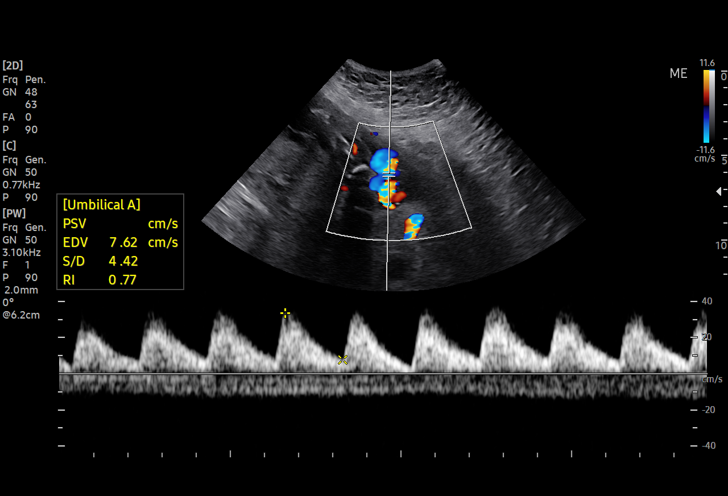

[14 of 28 positions shown; findings below may reference images not displayed]

IMPRESSION: Dear Dr. KINCIUS
 ,

 Thank you for referring your patient  for a fetal growth
 evaluation due to maternal type 1 diabetes .
 The best EGA is 26w 3d by LMP c/w earliest scan performed
 at [facility] at 7w 1d on 08/28/19.

 There is a singleton gestation with normal amniotic fluid
 volume.Incidental note of a 2cm  umbilical cord cyst Near the
 placental cord  insertion . Fetus moves independently of this
 cyst . No color flow noted in the cyst . I t could represent an
 allantoic cyst or an amniotic sheet .Other follow up anatomy
 appears normal.

 The estimated fetal weight is at the  7 th percentile.  ( AC is at
 the 9th )
 This finding could be a sign of intrauterine growth restriction
 (IUGR).

 It must be noted that estimated fetal weight by ultrasound and
 actual birthweight can differ up to 15 percent.
 Normal movement , tone and fluid seen. BPP [DATE] ( too early
 for breathing movements )

 The umbilical artery Doppler studies showed diastolic flow
 throughout  (S/D = 4.42 - 92 %ile )

 Weekly testing (BPP and Doppler studies)  were scheduled .
 Serial growth scans are suggested (approximately every 3
 weeks).

 Thank you for allowing us to participate in your patient's care.
 assistance.

## 2020-08-29 ENCOUNTER — Other Ambulatory Visit: Payer: Self-pay | Admitting: Endocrinology

## 2020-09-17 ENCOUNTER — Other Ambulatory Visit: Payer: Self-pay

## 2020-09-17 ENCOUNTER — Encounter: Payer: Self-pay | Admitting: Endocrinology

## 2020-09-17 ENCOUNTER — Ambulatory Visit (INDEPENDENT_AMBULATORY_CARE_PROVIDER_SITE_OTHER): Payer: BC Managed Care – PPO | Admitting: Endocrinology

## 2020-09-17 VITALS — BP 118/64 | HR 82 | Ht 60.0 in | Wt 198.0 lb

## 2020-09-17 DIAGNOSIS — E109 Type 1 diabetes mellitus without complications: Secondary | ICD-10-CM | POA: Diagnosis not present

## 2020-09-17 LAB — POCT GLYCOSYLATED HEMOGLOBIN (HGB A1C): Hemoglobin A1C: 6.5 % — AB (ref 4.0–5.6)

## 2020-09-17 NOTE — Progress Notes (Signed)
Subjective:    Patient ID: Jordan Leonard, female    DOB: 1990/12/22, 29 y.o.   MRN: [id number]  HPI Pt returns for f/u of diabetes mellitus:  DM type: 1 Dx'ed: 2000 Complications: none Therapy: insulin since dx, and pump rx since 2005 (Tandem pump and Dexcom G6 continuous glucose monitor since 2020).   GDM: never.  DKA: only once, at dx Severe hypoglycemia: never.  Pancreatitis: never Pancreatic imaging: never.  Other: she eats meals at 8 AM, noon, and 6 PM; she works from home now.   Interval history:  She takes these pump settings:  basal rate of 1.2 units/hr, 6AM-9PM, and 1.5/HR overnight.  mealtime bolus of 1 unit/4 grams carbohydrate at all times of day.   correction bolus (which some people call "sensitivity," or "insulin sensitivity ratio," or just "isr") of 1 unit for each 50 by which your glucose exceeds 120.   continuous glucose monitor is downloaded today, and the printout is scanned into the record.  glucose varies from 50-340.  It is in general lowest in the middle of the night, and in the afternoon, but there is little trend throughout the day.  she has mild hypoglycemia approx once per week.   TDD is 58 units (51% basal).  Pt says she eats just 2 meals per day.   She is 6 months postpartum.  She is no longer breast feeding.  Past Medical History:  Diagnosis Date  . ADHD   . ADHD   . Anxiety   . COVID-19   . Diabetes mellitus type 1 (HCC)    Type 1  . Headache    Hormonal Migraines   . History of cesarean section 09/01/2016  . History of chlamydia infection 09/01/2016   2007  . PONV (postoperative nausea and vomiting)   . STD (sexually transmitted disease)    chlaymdia- 10 years ago    Past Surgical History:  Procedure Laterality Date  . CESAREAN SECTION    . CESAREAN SECTION Bilateral 03/27/2020   Procedure: CESAREAN SECTION;  Surgeon: Linzie Collin, MD;  Location: [facility] ORS;  Service: Obstetrics;  Laterality: Bilateral;  Repeat C-Section  . TOE  SURGERY Right 2020  . WISDOM TOOTH EXTRACTION Bilateral    Highschool    Social History   Socioeconomic History  . Marital status: Married    Spouse name: Jill Alexanders  . Number of children: 1  . Years of education: Not on file  . Highest education level: Not on file  Occupational History  . Not on file  Tobacco Use  . Smoking status: Never Smoker  . Smokeless tobacco: Never Used  Vaping Use  . Vaping Use: Never used  Substance and Sexual Activity  . Alcohol use: Not Currently    Comment: rare  . Drug use: No  . Sexual activity: Yes    Birth control/protection: I.U.D.    Comment: Mirena inserted 07/21/2020  Other Topics Concern  . Not on file  Social History Narrative   Married.    1 child.   Works at [employer].   Enjoys being with family, working on mind puzzles   Social Determinants of Corporate investment banker Strain: Not on [employer] Insecurity: Not on file  Transportation Needs: Not on file  Physical Activity: Not on file  Stress: Not on file  Social Connections: Not on file  Intimate Partner Violence: Not on file    Current Outpatient Medications on File Prior to Visit  Medication Sig  Dispense Refill  . glucagon 1 MG injection Inject 1 mg into the skin once as needed.     Marland Kitchen glucose blood (FREESTYLE PRECISION NEO TEST) test strip Use to monitor glucose levels 8 times per day; E11.9 100 each 12  . hydrOXYzine (ATARAX/VISTARIL) 25 MG tablet Take 0.5-1 tablets (12.5-25 mg total) by mouth daily as needed. Only for severe panic attacks 90 tablet 1  . insulin aspart (NOVOLOG) 100 UNIT/ML injection USE VIA INSULIN PUMP. MAX OF 80 UNITS PER DAY. 90 mL 3  . levonorgestrel (MIRENA) 20 MCG/24HR IUD 1 each by Intrauterine route once. Inserted 07/21/2020    . Prenatal Vit-Fe Fumarate-FA (PRENATAL MULTIVITAMIN) TABS tablet Take 1 tablet by mouth daily at 12 noon.     No current facility-administered medications on file prior to visit.    Allergies  Allergen  Reactions  . Amoxicillin Anaphylaxis  . Penicillins Anaphylaxis    Family History  Problem Relation Age of Onset  . Asthma Sister   . Multiple sclerosis Sister   . Anxiety disorder Sister   . Depression Sister   . Depression Mother   . Hypertension Mother   . Hypertension Father   . Breast cancer Neg Hx   . Ovarian cancer Neg Hx   . Colon cancer Neg Hx   . Diabetes Neg Hx   . Heart disease Neg Hx   . Diabetes gravidarum Neg Hx   . Diabetes type I Neg Hx     BP 118/64   Pulse 82   Ht 5' (1.524 m)   Wt 198 lb (89.8 kg)   SpO2 94%   BMI 38.67 kg/m    Review of Systems     Objective:   Physical Exam VITAL SIGNS:  See vs page GENERAL: no distress Pulses: dorsalis pedis intact bilat.   MSK: no deformity of the feet CV: no leg edema Skin:  no ulcer on the feet.  normal color and temp on the feet. Neuro: sensation is intact to touch on the feet   Lab Results  Component Value Date   HGBA1C 6.5 (A) 09/17/2020       Assessment & Plan:  Type 1 DM.  Hypoglycemia, due to insulin: this limits aggressiveness of glycemic control.   Patient Instructions  Please continue these settings:  basal rate of 1.2 units/hr, 6AM-9PM, and 1.5/HR overnight.  mealtime bolus of 1 unit/4 grams carbohydrate at all times of day.   correction bolus (which some people call "sensitivity," or "insulin sensitivity ratio," or just "isr") of 1 unit for each 50 by which your glucose exceeds 120.   Please come back for a follow-up appointment in 3 months.

## 2020-09-17 NOTE — Patient Instructions (Addendum)
Please continue these settings:  basal rate of 1.2 units/hr, 6AM-9PM, and 1.5/HR overnight.  mealtime bolus of 1 unit/4 grams carbohydrate at all times of day.   correction bolus (which some people call "sensitivity," or "insulin sensitivity ratio," or just "isr") of 1 unit for each 50 by which your glucose exceeds 120.   Please come back for a follow-up appointment in 3 months.

## 2020-09-18 MED ORDER — T:SLIM INSULIN CARTRIDGE 3ML MISC: 1.0000 | 3 refills | Status: AC

## 2020-09-18 MED ORDER — DEXCOM G6 TRANSMITTER MISC: 1.0000 | Freq: Once | 1 refills | Status: AC

## 2020-09-18 MED ORDER — "QUICK-SET INFUSION 23"" 9MM MISC": 1.0000 | 3 refills | Status: AC

## 2020-09-18 MED ORDER — DEXCOM G6 SENSOR MISC: 1.0000 | 3 refills | Status: AC

## 2020-09-22 DIAGNOSIS — E109 Type 1 diabetes mellitus without complications: Secondary | ICD-10-CM | POA: Diagnosis not present

## 2020-09-22 DIAGNOSIS — Z794 Long term (current) use of insulin: Secondary | ICD-10-CM | POA: Diagnosis not present

## 2020-09-22 DIAGNOSIS — E108 Type 1 diabetes mellitus with unspecified complications: Secondary | ICD-10-CM | POA: Diagnosis not present

## 2020-09-26 IMAGING — US US MFM FETAL BPP W/O NON-STRESS
1 series · 13 of 16 positions shown · non-contrast
Comparison: none

PATIENT INFO:

PERFORMED BY:
SERVICE(S) PROVIDED:
 ----------------------------------------------------------------------
INDICATIONS:
  31 weeks gestation of pregnancy
FETAL EVALUATION:
 Num Of Fetuses:         1
 Fetal Heart Rate(bpm):  147
 Cardiac Activity:       Present
 Presentation:           Vertex
 Placenta:               Posterior
 AFI Sum(cm)     %Tile       Largest Pocket(cm)
 11.88           30
 RUQ(cm)       RLQ(cm)       LUQ(cm)        LLQ(cm)
BIOPHYSICAL EVALUATION:
 Amniotic F.V:   Within normal limits       F. Tone:        Observed
 F. Movement:    Observed                   Score:          [DATE]
 F. Breathing:   Observed
GESTATIONAL AGE:
 LMP:           31w 3d        Date:  07/08/19                 EDD:   04/13/20
 Best:          31w 3d     Det. By:  LMP  (07/08/19)          EDD:   04/13/20

[Series 1: us mfm fetal bpp w/o non-stress · 0.23mm/px · 13 of 16 slices shown]
[im 1/16]
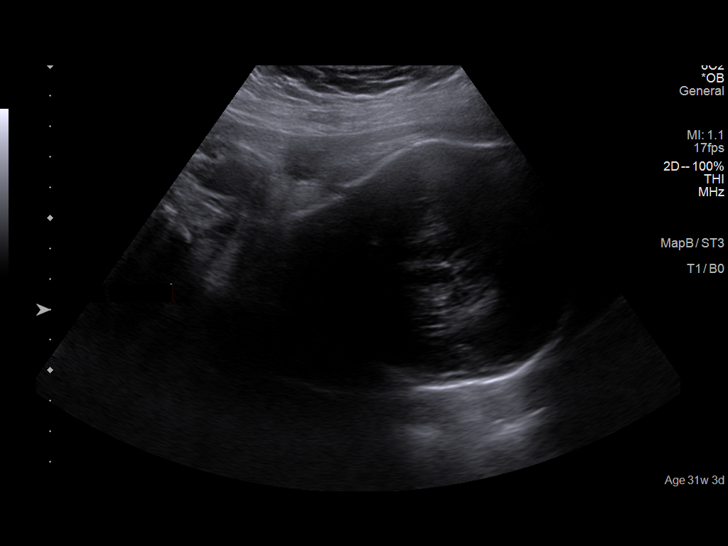
[im 2/16]
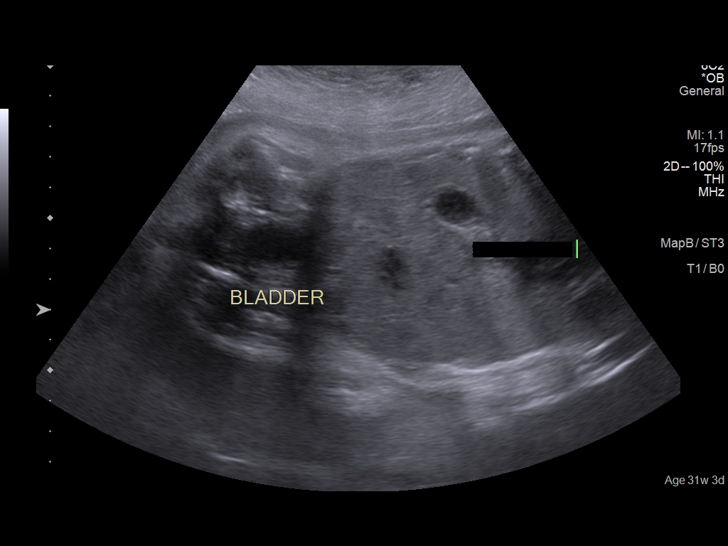
[im 4/16]
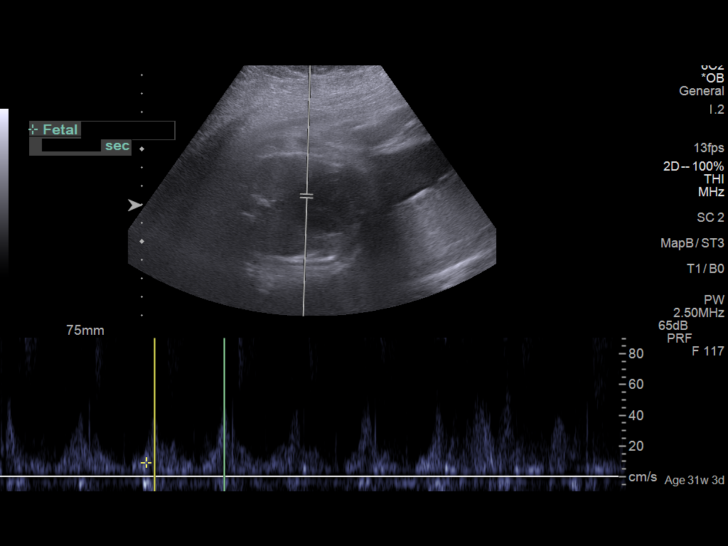
[im 5/16]
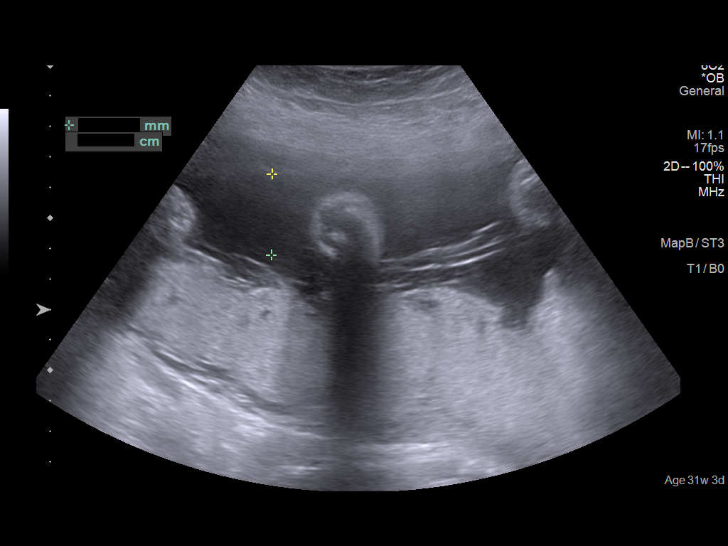
[im 6/16]
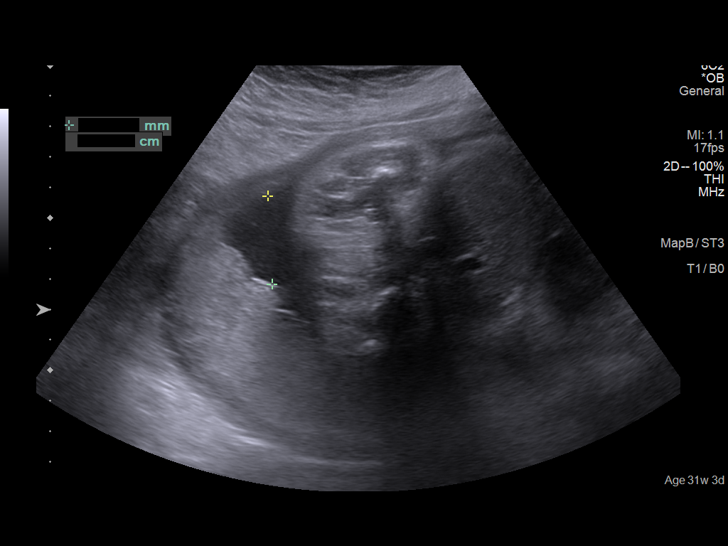
[im 7/16]
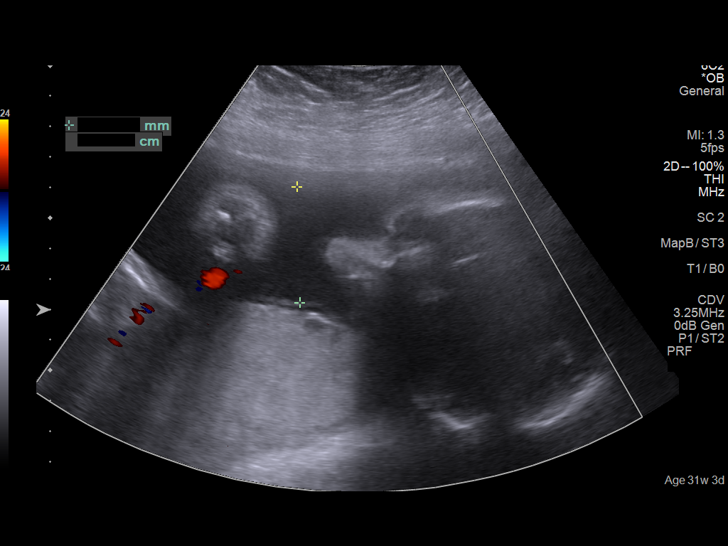
[im 9/16]
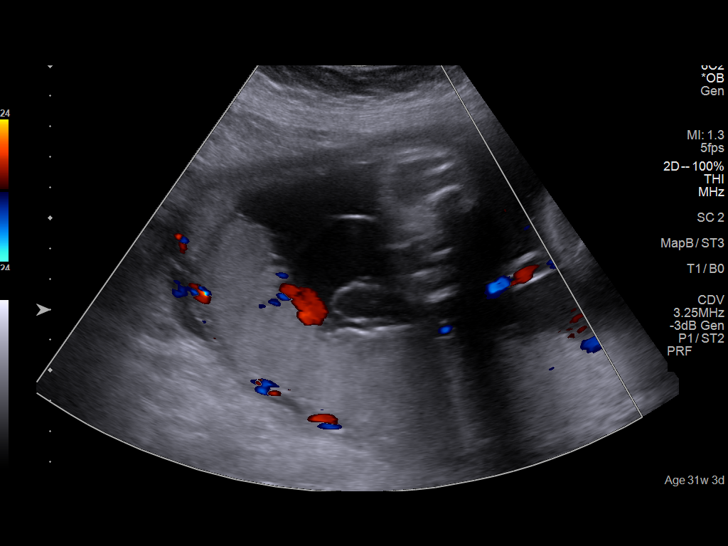
[im 10/16]
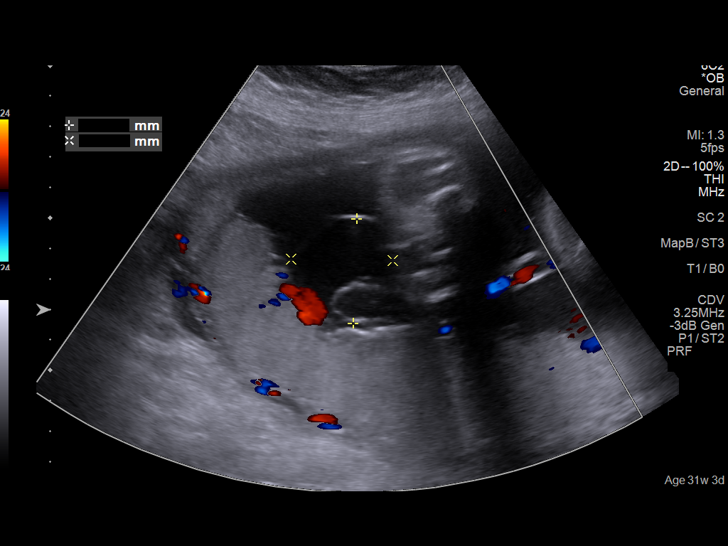
[im 11/16]
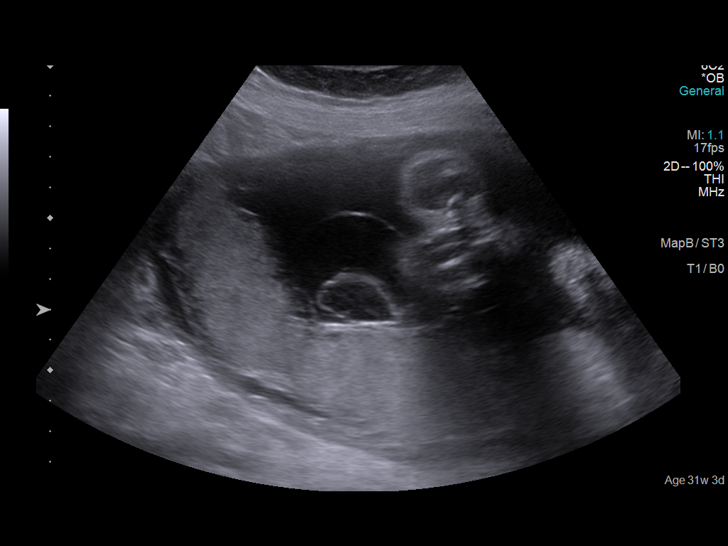
[im 12/16]
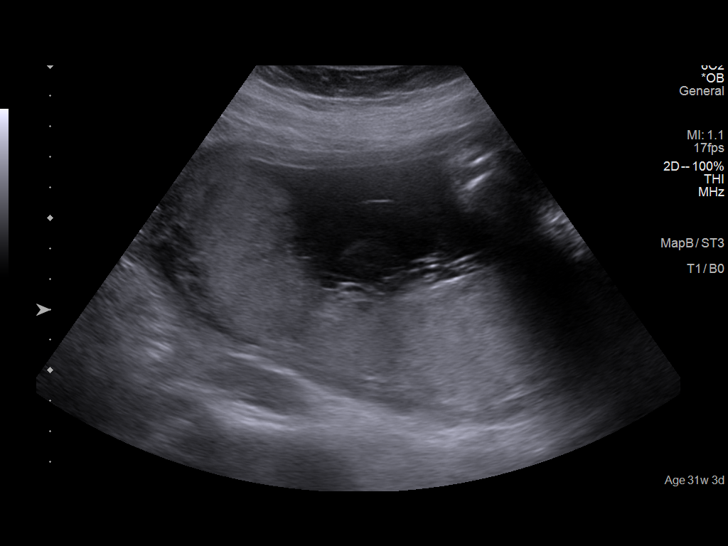
[im 13/16]
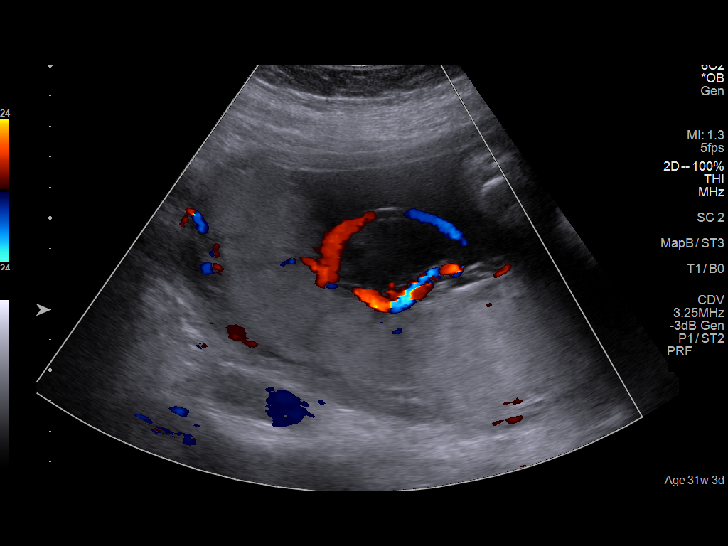
[im 15/16]
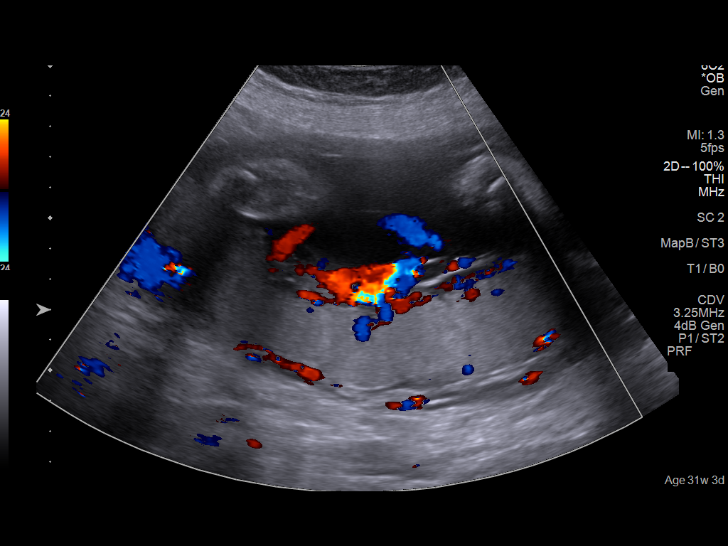
[im 16/16]
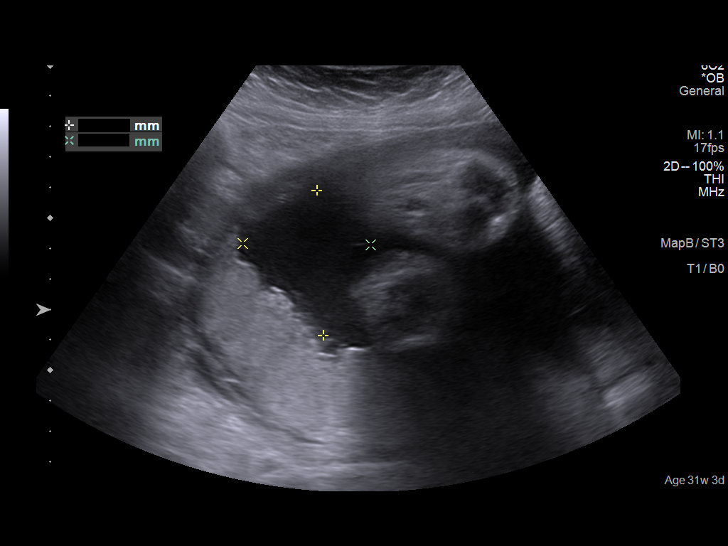

[13 of 16 positions shown; findings below may reference images not displayed]

IMPRESSION: Dear Dr.   CHIMANGI
 ,

 Thank you for referring your patient  for fetal biophysical
 profile (BPP) due to maternal IDDM and h/o FGR ( resolved
 on last scan).
 She is currently at 31w 3d based on LMP c/w earliest scan
 done at [facility] on 08/28/19 at 7w 1d
 There is a singleton gestation with normal amniotic fluid
 volume.

 The BPP was noted to be [DATE], which was reassuring.
 The previously noted cyst at the cord insertion is again seen
 today with umbical arteries around it,  suggesting it to be a
 cord cyst .

 Follow-up BPP and growth  is suggested in one week.
 Patient is doing well with her glycemic control with Dr Osorto
 and is planning for cesarean on [DATE]. She is working on a
 plan for her postpartum insulin regimen.

 Thank you for allowing us to participate in your patient's care.
 assistance.

## 2020-10-13 ENCOUNTER — Encounter: Payer: BC Managed Care – PPO | Admitting: Obstetrics and Gynecology

## 2020-10-26 DIAGNOSIS — M654 Radial styloid tenosynovitis [de Quervain]: Secondary | ICD-10-CM | POA: Diagnosis not present

## 2020-12-15 DIAGNOSIS — E108 Type 1 diabetes mellitus with unspecified complications: Secondary | ICD-10-CM | POA: Diagnosis not present

## 2020-12-15 DIAGNOSIS — E109 Type 1 diabetes mellitus without complications: Secondary | ICD-10-CM | POA: Diagnosis not present

## 2020-12-15 DIAGNOSIS — Z794 Long term (current) use of insulin: Secondary | ICD-10-CM | POA: Diagnosis not present

## 2020-12-16 ENCOUNTER — Other Ambulatory Visit: Payer: Self-pay

## 2020-12-16 ENCOUNTER — Ambulatory Visit (INDEPENDENT_AMBULATORY_CARE_PROVIDER_SITE_OTHER): Payer: BC Managed Care – PPO | Admitting: Endocrinology

## 2020-12-16 VITALS — BP 118/70 | HR 85 | Ht 61.0 in | Wt 198.4 lb

## 2020-12-16 DIAGNOSIS — E109 Type 1 diabetes mellitus without complications: Secondary | ICD-10-CM | POA: Diagnosis not present

## 2020-12-16 LAB — POCT GLYCOSYLATED HEMOGLOBIN (HGB A1C): Hemoglobin A1C: 6.5 % — AB (ref 4.0–5.6)

## 2020-12-16 NOTE — Progress Notes (Signed)
Subjective:    Patient ID: Jordan Leonard, female    DOB: 1991-09-13, 30 y.o.   MRN: [id number]  HPI Pt returns for f/u of diabetes mellitus:  DM type: 1 Dx'ed: 2000 Complications: none Therapy: insulin since dx, and pump rx since 2005 (Tandem pump and Dexcom G6 continuous glucose monitor since 2020).   GDM: never.  DKA: only once, at dx Severe hypoglycemia: never.  Pancreatitis: never Pancreatic imaging: never.  Other: she eats meals at 8 AM, noon, and 6 PM; she works from home now.   Interval history:  She takes these pump settings:  basal rate of 1.2 units/hr, 6AM-9PM, and 1.5/HR overnight.  mealtime bolus of 1 unit/4 grams carbohydrate at all times of day.   correction bolus (which some people call "sensitivity," or "insulin sensitivity ratio," or just "isr") of 1 unit for each 40 by which your glucose exceeds 120.  continuous glucose monitor is downloaded today, and the printout is scanned into the record.  glucose varies from 47-244.  It is in general lowest at midnight, and in the afternoon, but there is little trend throughout the day.  she has mild hypoglycemia approx once per week.   TDD is 48 units (60% basal).  Pt says she eats just 2 meals per day.   She is 9 months postpartum.  She is not at risk for another pregnancy now.   "control-IQ" is on 98% of the time.  pt states she feels well in general.  She seldom has hypoglycemia, and these episodes are mild.   Past Medical History:  Diagnosis Date  . ADHD   . ADHD   . Anxiety   . COVID-19   . Diabetes mellitus type 1 (HCC)    Type 1  . Headache    Hormonal Migraines   . History of cesarean section 09/01/2016  . History of chlamydia infection 09/01/2016   2007  . PONV (postoperative nausea and vomiting)   . STD (sexually transmitted disease)    chlaymdia- 10 years ago    Past Surgical History:  Procedure Laterality Date  . CESAREAN SECTION    . CESAREAN SECTION Bilateral 03/27/2020   Procedure: CESAREAN  SECTION;  Surgeon: Linzie Collin, MD;  Location: [facility] ORS;  Service: Obstetrics;  Laterality: Bilateral;  Repeat C-Section  . TOE SURGERY Right 2020  . WISDOM TOOTH EXTRACTION Bilateral    Highschool    Social History   Socioeconomic History  . Marital status: Married    Spouse name: Jill Alexanders  . Number of children: 1  . Years of education: Not on file  . Highest education level: Not on file  Occupational History  . Not on file  Tobacco Use  . Smoking status: Never Smoker  . Smokeless tobacco: Never Used  Vaping Use  . Vaping Use: Never used  Substance and Sexual Activity  . Alcohol use: Not Currently    Comment: rare  . Drug use: No  . Sexual activity: Yes    Birth control/protection: I.U.D.    Comment: Mirena inserted 07/21/2020  Other Topics Concern  . Not on file  Social History Narrative   Married.    1 child.   Works at [employer].   Enjoys being with family, working on mind puzzles   Social Determinants of Corporate investment banker Strain: Not on [employer] Insecurity: Not on file  Transportation Needs: Not on file  Physical Activity: Not on file  Stress: Not on file  Social Connections: Not on file  Intimate Partner Violence: Not on file    Current Outpatient Medications on File Prior to Visit  Medication Sig Dispense Refill  . Continuous Blood Gluc Sensor (DEXCOM G6 SENSOR) MISC 1 Device by Does not apply route See admin instructions. 9 each 3  . glucagon 1 MG injection Inject 1 mg into the skin once as needed.     Marland Kitchen glucose blood (FREESTYLE PRECISION NEO TEST) test strip Use to monitor glucose levels 8 times per day; E11.9 100 each 12  . hydrOXYzine (ATARAX/VISTARIL) 25 MG tablet Take 0.5-1 tablets (12.5-25 mg total) by mouth daily as needed. Only for severe panic attacks 90 tablet 1  . insulin aspart (NOVOLOG) 100 UNIT/ML injection USE VIA INSULIN PUMP. MAX OF 80 UNITS PER DAY. 90 mL 3  . Insulin Infusion Pump Supplies (QUICK-SET INFUSION  23" ) MISC 1 Device by Does not apply route every 3 (three) days. 30 each 3  . Insulin Infusion Pump Supplies (T:SLIM INSULIN CARTRIDGE ) MISC 1 Device by Does not apply route every 3 (three) days. 30 each 3  . levonorgestrel (MIRENA) 20 MCG/24HR IUD 1 each by Intrauterine route once. Inserted 07/21/2020    . Prenatal Vit-Fe Fumarate-FA (PRENATAL MULTIVITAMIN) TABS tablet Take 1 tablet by mouth daily at 12 noon.     No current facility-administered medications on file prior to visit.    Allergies  Allergen Reactions  . Amoxicillin Anaphylaxis  . Penicillins Anaphylaxis    Family History  Problem Relation Age of Onset  . Asthma Sister   . Multiple sclerosis Sister   . Anxiety disorder Sister   . Depression Sister   . Depression Mother   . Hypertension Mother   . Hypertension Father   . Breast cancer Neg Hx   . Ovarian cancer Neg Hx   . Colon cancer Neg Hx   . Diabetes Neg Hx   . Heart disease Neg Hx   . Diabetes gravidarum Neg Hx   . Diabetes type I Neg Hx     BP 118/70 (BP Location: Right Arm, Patient Position: Sitting, Cuff Size: Large)   Pulse 85   Ht 5\' 1"  (1.549 m)   Wt 198 lb 6.4 oz (90 kg)   SpO2 99%   BMI 37.49 kg/m    Review of Systems     Objective:   Physical Exam VITAL SIGNS:  See vs page GENERAL: no distress Pulses: dorsalis pedis intact bilat.   MSK: no deformity of the feet CV: no leg edema Skin:  no ulcer on the feet.  normal color and temp on the feet. Neuro: sensation is intact to touch on the feet  Lab Results  Component Value Date   HGBA1C 6.5 (A) 12/16/2020       Assessment & Plan:  Type 1 DM. Hypoglycemia, due to insulin: this limits aggressiveness of glycemic control   Patient Instructions  Please continue these settings:  basal rate of 1.2 units/hr, 6AM-9PM, and 1.5/HR overnight.  mealtime bolus of 1 unit/4 grams carbohydrate at all times of day.   correction bolus (which some people call "sensitivity," or "insulin  sensitivity ratio," or just "isr") of 1 unit for each 40 by which your glucose exceeds 120.   Blood tests are requested for you today.  We'll let you know about the results.  Please come back for a follow-up appointment in 4 months.

## 2020-12-16 NOTE — Patient Instructions (Addendum)
Please continue these settings:  basal rate of 1.2 units/hr, 6AM-9PM, and 1.5/HR overnight.  mealtime bolus of 1 unit/4 grams carbohydrate at all times of day.   correction bolus (which some people call "sensitivity," or "insulin sensitivity ratio," or just "isr") of 1 unit for each 40 by which your glucose exceeds 120.   Blood tests are requested for you today.  We'll let you know about the results.  Please come back for a follow-up appointment in 4 months.

## 2020-12-17 LAB — BASIC METABOLIC PANEL
BUN: 17 mg/dL (ref 6–23)
CO2: 27 mEq/L (ref 19–32)
Calcium: 9.8 mg/dL (ref 8.4–10.5)
Chloride: 102 mEq/L (ref 96–112)
Creatinine, Ser: 0.79 mg/dL (ref 0.40–1.20)
GFR: 99.34 mL/min (ref >60.00)
Glucose, Bld: 107 mg/dL — ABNORMAL HIGH (ref 70–99)
Potassium: 4.6 mEq/L (ref 3.5–5.1)
Sodium: 138 mEq/L (ref 135–145)

## 2020-12-17 LAB — TSH: TSH: 2.87 u[IU]/mL (ref 0.35–4.50)

## 2020-12-17 LAB — LIPID PANEL
Cholesterol: 166 mg/dL (ref 0–200)
HDL: 67.2 mg/dL (ref >39.00)
LDL Cholesterol: 89 mg/dL (ref 0–99)
NonHDL: 98.56
Total CHOL/HDL Ratio: 2
Triglycerides: 46 mg/dL (ref 0.0–149.0)
VLDL: 9.2 mg/dL (ref 0.0–40.0)

## 2020-12-17 LAB — HEPATIC FUNCTION PANEL
ALT: 13 U/L (ref 0–35)
AST: 15 U/L (ref 0–37)
Albumin: 4.2 g/dL (ref 3.5–5.2)
Alkaline Phosphatase: 57 U/L (ref 39–117)
Bilirubin, Direct: 0.1 mg/dL (ref 0.0–0.3)
Total Bilirubin: 0.3 mg/dL (ref 0.2–1.2)
Total Protein: 7.4 g/dL (ref 6.0–8.3)

## 2020-12-17 LAB — T4, FREE: Free T4: 0.87 ng/dL (ref 0.60–1.60)

## 2020-12-29 ENCOUNTER — Ambulatory Visit: Payer: BC Managed Care – PPO | Admitting: Primary Care

## 2020-12-29 ENCOUNTER — Encounter: Payer: Self-pay | Admitting: Primary Care

## 2020-12-29 ENCOUNTER — Other Ambulatory Visit: Payer: Self-pay

## 2020-12-29 VITALS — BP 118/70 | HR 83 | Temp 98.3°F | Ht 61.0 in | Wt 197.5 lb

## 2020-12-29 DIAGNOSIS — F339 Major depressive disorder, recurrent, unspecified: Secondary | ICD-10-CM

## 2020-12-29 DIAGNOSIS — Z1159 Encounter for screening for other viral diseases: Secondary | ICD-10-CM | POA: Diagnosis not present

## 2020-12-29 DIAGNOSIS — E109 Type 1 diabetes mellitus without complications: Secondary | ICD-10-CM | POA: Diagnosis not present

## 2020-12-29 DIAGNOSIS — R5383 Other fatigue: Secondary | ICD-10-CM | POA: Diagnosis not present

## 2020-12-29 DIAGNOSIS — F411 Generalized anxiety disorder: Secondary | ICD-10-CM

## 2020-12-29 LAB — VITAMIN D 25 HYDROXY (VIT D DEFICIENCY, FRACTURES): VITD: 26.23 ng/mL — ABNORMAL LOW (ref 30.00–100.00)

## 2020-12-29 LAB — VITAMIN B12: Vitamin B-12: 528 pg/mL (ref 211–911)

## 2020-12-29 LAB — MICROALBUMIN / CREATININE URINE RATIO
Creatinine,U: 59.1 mg/dL
Microalb Creat Ratio: 1.2 mg/g (ref 0.0–30.0)
Microalb, Ur: <0.7 mg/dL (ref 0.0–1.9)

## 2020-12-29 MED ORDER — SERTRALINE HCL 50 MG PO TABS: 50.0000 mg | ORAL_TABLET | Freq: Every day | ORAL | 1 refills | Status: DC

## 2020-12-29 NOTE — Progress Notes (Signed)
Subjective:    Patient ID: Jordan Leonard, female    DOB: 03-17-1991, 30 y.o.   MRN: [id number]  HPI  BRANDE UNCAPHER is a very pleasant 30 y.o. female with a history of ADHD, GAD, MDD, type 1 diabetes, panic attacks who presents today to discuss several issues and follow up.  1) MDD/GAD/Panic Attacks: She is post partum since June 2021. During her pregnancy she felt anxious, guilt, "shame", depression for which she knows stems from childhood trauma. Previously in therapy, has not been as both of her prior therapists have left the practice. She's feeling abandoned after this. Previously seeing psychiatrist, no recent visit since pregnancy in 2021.   Symptoms now include feeling anxious, worry, feeling down, fatigued. She is managed on Klonopin once every 1-2 months. She is also managed on hydroxyzine 25 mg for which she takes once weekly on average for anxiety symptoms.   She is questioning if her "hormones" are imbalanced. TSH of 2.87 and Free T4 of 0.87 as of 12/16/20.  GAD 7 : Generalized Anxiety Score 12/29/2020  Control/stop worrying 2  Worry too much - different things 2  Trouble relaxing 1  Restless 0  Easily annoyed or irritable 1  Afraid - awful might happen 0  Anxiety Difficulty Not difficult at all  Some encounter information is confidential and restricted. Go to Review Flowsheets activity to see all data.    PHQ9 SCORE ONLY 12/29/2020 07/15/2020 05/06/2020  PHQ-9 Total Score 10 8 2   Some encounter information is confidential and restricted. Go to Review Flowsheets activity to see all data.      2) Type 1 Diabetes: Follows with endocrinology, recent A1C of 6.5. managed on an infusion pump. Urine microalbumin due. She feels well managed on this regimen.    Review of Systems  Constitutional: Positive for fatigue.  Respiratory: Negative for shortness of breath.   Cardiovascular: Negative for chest pain.  Psychiatric/Behavioral: Positive for agitation. The patient  is nervous/anxious.        See HPI         Past Medical History:  Diagnosis Date  . ADHD   . ADHD   . Anxiety   . COVID-19   . Diabetes mellitus type 1 (HCC)    Type 1  . Headache    Hormonal Migraines   . History of cesarean section 09/01/2016  . History of chlamydia infection 09/01/2016   2007  . PONV (postoperative nausea and vomiting)   . STD (sexually transmitted disease)    chlaymdia- 10 years ago    Social History   Socioeconomic History  . Marital status: Married    Spouse name: 2008  . Number of children: 1  . Years of education: Not on file  . Highest education level: Not on file  Occupational History  . Not on file  Tobacco Use  . Smoking status: Never Smoker  . Smokeless tobacco: Never Used  Vaping Use  . Vaping Use: Never used  Substance and Sexual Activity  . Alcohol use: Not Currently    Comment: rare  . Drug use: No  . Sexual activity: Yes    Birth control/protection: I.U.D.    Comment: Mirena inserted 07/21/2020  Other Topics Concern  . Not on file  Social History Narrative   Married.    1 child.   Works at 07/23/2020.   Enjoys being with family, working on mind puzzles   Social Determinants of [employer] Strain: Not on  file  Food Insecurity: Not on file  Transportation Needs: Not on file  Physical Activity: Not on file  Stress: Not on file  Social Connections: Not on file  Intimate Partner Violence: Not on file    Past Surgical History:  Procedure Laterality Date  . CESAREAN SECTION    . CESAREAN SECTION Bilateral 03/27/2020   Procedure: CESAREAN SECTION;  Surgeon: Linzie Collin, MD;  Location: [facility] ORS;  Service: Obstetrics;  Laterality: Bilateral;  Repeat C-Section  . TOE SURGERY Right 2020  . WISDOM TOOTH EXTRACTION Bilateral    Highschool    Family History  Problem Relation Age of Onset  . Asthma Sister   . Multiple sclerosis Sister   . Anxiety disorder Sister   . Depression Sister    . Depression Mother   . Hypertension Mother   . Hypertension Father   . Breast cancer Neg Hx   . Ovarian cancer Neg Hx   . Colon cancer Neg Hx   . Diabetes Neg Hx   . Heart disease Neg Hx   . Diabetes gravidarum Neg Hx   . Diabetes type I Neg Hx     Allergies  Allergen Reactions  . Amoxicillin Anaphylaxis  . Penicillins Anaphylaxis    Current Outpatient Medications on File Prior to Visit  Medication Sig Dispense Refill  . Continuous Blood Gluc Sensor (DEXCOM G6 SENSOR) MISC 1 Device by Does not apply route See admin instructions. 9 each 3  . glucagon 1 MG injection Inject 1 mg into the skin once as needed.     Marland Kitchen glucose blood (FREESTYLE PRECISION NEO TEST) test strip Use to monitor glucose levels 8 times per day; E11.9 100 each 12  . hydrOXYzine (ATARAX/VISTARIL) 25 MG tablet Take 0.5-1 tablets (12.5-25 mg total) by mouth daily as needed. Only for severe panic attacks 90 tablet 1  . insulin aspart (NOVOLOG) 100 UNIT/ML injection USE VIA INSULIN PUMP. MAX OF 80 UNITS PER DAY. 90 mL 3  . Insulin Infusion Pump Supplies (QUICK-SET INFUSION 23" ) MISC 1 Device by Does not apply route every 3 (three) days. 30 each 3  . Insulin Infusion Pump Supplies (T:SLIM INSULIN CARTRIDGE ) MISC 1 Device by Does not apply route every 3 (three) days. 30 each 3  . levonorgestrel (MIRENA) 20 MCG/24HR IUD 1 each by Intrauterine route once. Inserted 07/21/2020    . Prenatal Vit-Fe Fumarate-FA (PRENATAL MULTIVITAMIN) TABS tablet Take 1 tablet by mouth daily at 12 noon.     No current facility-administered medications on file prior to visit.    BP 118/70   Pulse 83   Temp 98.3 F (36.8 C) (Temporal)   Ht 5\' 1"  (1.549 m)   Wt 197 lb 8 oz (89.6 kg)   SpO2 100%   BMI 37.32 kg/m  Objective:   Physical Exam Cardiovascular:     Rate and Rhythm: Normal rate and regular rhythm.  Pulmonary:     Effort: Pulmonary effort is normal.     Breath sounds: Normal breath sounds.  Musculoskeletal:      Cervical back: Neck supple.  Skin:    General: Skin is warm and dry.  Psychiatric:        Mood and Affect: Mood normal.           Assessment & Plan:      This visit occurred during the SARS-CoV-2 public health emergency.  Safety protocols were in place, including screening questions prior to the visit, additional usage of staff PPE, and  extensive cleaning of exam room while observing appropriate contact time as indicated for disinfecting solutions.

## 2020-12-29 NOTE — Patient Instructions (Addendum)
Start sertraline (Zoloft) 50 mg daily for anxiety and depression. Take 1/2 tablet daily for about one week, then increase to 1 tablet thereafter.   Stop by the lab prior to leaving today. I will notify you of your results once received.   Please schedule a follow up visit for 6 weeks for follow up of anxiety/depression.  It was a pleasure to see you today!

## 2020-12-29 NOTE — Assessment & Plan Note (Addendum)
Stable with A1C of 6.5 recently. Follows with endocrinology. Continue current regimen of Novolog insulin pump.  Urine microalbumin pending.

## 2020-12-29 NOTE — Assessment & Plan Note (Signed)
Uncontrolled, ready for treatment.  Discussed options for treatment including therapy vs medication, she opts for both.  Rx for Zoloft 50 mg sent to pharmacy.  Referral placed to therapy.   Patient is to take 1/2 tablet daily for 8 days, then advance to 1 full tablet thereafter. We discussed possible side effects of headache, GI upset, drowsiness, and SI/HI. If thoughts of SI/HI develop, we discussed to present to the emergency immediately. Patient verbalized understanding.   Follow up in 6 weeks for re-evaluation.

## 2020-12-29 NOTE — Assessment & Plan Note (Signed)
Uncontrolled, ready for treatment.  Discussed options for treatment including therapy vs medication, she opts for both.  Rx for Zoloft 50 mg sent to pharmacy.  Referral placed to therapy.   Patient is to take 1/2 tablet daily for 8 days, then advance to 1 full tablet thereafter. We discussed possible side effects of headache, GI upset, drowsiness, and SI/HI. If thoughts of SI/HI develop, we discussed to present to the emergency immediately. Patient verbalized understanding.   Follow up in 6 weeks for re-evaluation.   

## 2020-12-30 LAB — HEPATITIS C ANTIBODY
Hepatitis C Ab: NONREACTIVE
SIGNAL TO CUT-OFF: 0.01 (ref <1.00)

## 2020-12-31 ENCOUNTER — Encounter: Payer: Self-pay | Admitting: Primary Care

## 2021-01-07 ENCOUNTER — Encounter: Payer: Self-pay | Admitting: Primary Care

## 2021-01-07 LAB — HM DIABETES EYE EXAM

## 2021-01-20 ENCOUNTER — Other Ambulatory Visit: Payer: Self-pay | Admitting: Primary Care

## 2021-01-20 DIAGNOSIS — F411 Generalized anxiety disorder: Secondary | ICD-10-CM

## 2021-01-20 DIAGNOSIS — F339 Major depressive disorder, recurrent, unspecified: Secondary | ICD-10-CM

## 2021-02-01 ENCOUNTER — Ambulatory Visit (INDEPENDENT_AMBULATORY_CARE_PROVIDER_SITE_OTHER): Payer: BC Managed Care – PPO | Admitting: Psychology

## 2021-02-01 DIAGNOSIS — F331 Major depressive disorder, recurrent, moderate: Secondary | ICD-10-CM | POA: Diagnosis not present

## 2021-02-01 DIAGNOSIS — F411 Generalized anxiety disorder: Secondary | ICD-10-CM

## 2021-02-09 ENCOUNTER — Ambulatory Visit (INDEPENDENT_AMBULATORY_CARE_PROVIDER_SITE_OTHER): Payer: BC Managed Care – PPO | Admitting: Primary Care

## 2021-02-09 ENCOUNTER — Encounter: Payer: Self-pay | Admitting: Primary Care

## 2021-02-09 ENCOUNTER — Other Ambulatory Visit: Payer: Self-pay

## 2021-02-09 ENCOUNTER — Ambulatory Visit: Payer: BC Managed Care – PPO | Admitting: Primary Care

## 2021-02-09 VITALS — BP 126/78 | HR 88 | Temp 97.9°F | Ht 61.0 in | Wt 194.1 lb

## 2021-02-09 DIAGNOSIS — F339 Major depressive disorder, recurrent, unspecified: Secondary | ICD-10-CM | POA: Diagnosis not present

## 2021-02-09 DIAGNOSIS — F411 Generalized anxiety disorder: Secondary | ICD-10-CM | POA: Diagnosis not present

## 2021-02-09 MED ORDER — ESCITALOPRAM OXALATE 10 MG PO TABS: 10.0000 mg | ORAL_TABLET | Freq: Every day | ORAL | 0 refills | Status: DC

## 2021-02-09 NOTE — Assessment & Plan Note (Signed)
Slightly improved on Zoloft 25 mg, cannot tolerate 50 mg dose due to fatigue.   Discussed options and we decided to switch to Lexapro 10 mg. She will continue with her therapist.  She will update via My Chart in 3 weeks. If she continues to struggle with fatigue then we will consider venlafaxine ER 37.5 mg. 

## 2021-02-09 NOTE — Patient Instructions (Signed)
Stop taking sertraline (Zoloft) for anxiety and depression.  Start taking escitalopram (Lexapro) 10 mg for anxiety and depression.  Please update me via My Chart in 3-4 weeks.  It was a pleasure to see you today!

## 2021-02-09 NOTE — Assessment & Plan Note (Signed)
Slightly improved on Zoloft 25 mg, cannot tolerate 50 mg dose due to fatigue.   Discussed options and we decided to switch to Lexapro 10 mg. She will continue with her therapist.  She will update via My Chart in 3 weeks. If she continues to struggle with fatigue then we will consider venlafaxine ER 37.5 mg.

## 2021-02-09 NOTE — Progress Notes (Signed)
Subjective:    Patient ID: Jordan Leonard, female    DOB: 1990/11/28, 30 y.o.   MRN: [id number]  HPI  Jordan Leonard is a very pleasant 30 y.o. female with a history of anxiety and depression, type 1 diabetes who presents today for follow up of anxiety and depression.  She was last evaluated on 12/29/20 for symptoms of anxiety and depression since her pregnancy in 2021 which brought back up memories from her childhood. Symptoms included feeling worried, anxious, down, fatigued. Given symptoms we decided to initiate Zoloft 50 mg and refer to therapy.   Since her last visit she had fatigue while taking Zoloft 25 mg and especially at the 50 mg dose. She continues to feel no motivation to do anything including exercise. Some positive effects include feeling less anxious and is more relaxed, feeling less down/sad.   She has seen her therapist once, first session last week, has an appointment for two weeks.    Review of Systems  Constitutional: Positive for fatigue.  Psychiatric/Behavioral:       See HPI         Past Medical History:  Diagnosis Date  . ADHD   . ADHD   . Anxiety   . Attention deficit hyperactivity disorder (ADHD), combined type 09/01/2016  . COVID-19   . Diabetes mellitus in pregnancy in third trimester 01/09/2020  . Diabetes mellitus type 1 (HCC)    Type 1  . Headache    Hormonal Migraines   . History of cesarean delivery 09/01/2016   Emergency for fetal status  Pt recollects pp HA   . History of cesarean section 09/01/2016  . History of chlamydia infection 09/01/2016   2007  . Nausea and vomiting during pregnancy 02/29/2020  . PONV (postoperative nausea and vomiting)   . STD (sexually transmitted disease)    chlaymdia- 10 years ago    Social History   Socioeconomic History  . Marital status: Married    Spouse name: Jill Alexanders  . Number of children: 1  . Years of education: Not on file  . Highest education level: Not on file  Occupational History   . Not on file  Tobacco Use  . Smoking status: Never Smoker  . Smokeless tobacco: Never Used  Vaping Use  . Vaping Use: Never used  Substance and Sexual Activity  . Alcohol use: Not Currently    Comment: rare  . Drug use: No  . Sexual activity: Yes    Birth control/protection: I.U.D.    Comment: Mirena inserted 07/21/2020  Other Topics Concern  . Not on file  Social History Narrative   Married.    1 child.   Works at [employer].   Enjoys being with family, working on mind puzzles   Social Determinants of Corporate investment banker Strain: Not on [employer] Insecurity: Not on file  Transportation Needs: Not on file  Physical Activity: Not on file  Stress: Not on file  Social Connections: Not on file  Intimate Partner Violence: Not on file    Past Surgical History:  Procedure Laterality Date  . CESAREAN SECTION    . CESAREAN SECTION Bilateral 03/27/2020   Procedure: CESAREAN SECTION;  Surgeon: Linzie Collin, MD;  Location: [facility] ORS;  Service: Obstetrics;  Laterality: Bilateral;  Repeat C-Section  . TOE SURGERY Right 2020  . WISDOM TOOTH EXTRACTION Bilateral    Highschool    Family History  Problem Relation Age of Onset  . Asthma Sister   .  Multiple sclerosis Sister   . Anxiety disorder Sister   . Depression Sister   . Depression Mother   . Hypertension Mother   . Hypertension Father   . Breast cancer Neg Hx   . Ovarian cancer Neg Hx   . Colon cancer Neg Hx   . Diabetes Neg Hx   . Heart disease Neg Hx   . Diabetes gravidarum Neg Hx   . Diabetes type I Neg Hx     Allergies  Allergen Reactions  . Amoxicillin Anaphylaxis  . Penicillins Anaphylaxis    Current Outpatient Medications on File Prior to Visit  Medication Sig Dispense Refill  . Continuous Blood Gluc Sensor (DEXCOM G6 SENSOR) MISC 1 Device by Does not apply route See admin instructions. 9 each 3  . glucagon 1 MG injection Inject 1 mg into the skin once as needed.     Marland Kitchen glucose  blood (FREESTYLE PRECISION NEO TEST) test strip Use to monitor glucose levels 8 times per day; E11.9 100 each 12  . hydrOXYzine (ATARAX/VISTARIL) 25 MG tablet Take 0.5-1 tablets (12.5-25 mg total) by mouth daily as needed. Only for severe panic attacks 90 tablet 1  . insulin aspart (NOVOLOG) 100 UNIT/ML injection USE VIA INSULIN PUMP. MAX OF 80 UNITS PER DAY. 90 mL 3  . Insulin Infusion Pump Supplies (QUICK-SET INFUSION 23" ) MISC 1 Device by Does not apply route every 3 (three) days. 30 each 3  . Insulin Infusion Pump Supplies (T:SLIM INSULIN CARTRIDGE ) MISC 1 Device by Does not apply route every 3 (three) days. 30 each 3  . levonorgestrel (MIRENA) 20 MCG/24HR IUD 1 each by Intrauterine route once. Inserted 07/21/2020    . Prenatal Vit-Fe Fumarate-FA (PRENATAL MULTIVITAMIN) TABS tablet Take 1 tablet by mouth daily at 12 noon.    . sertraline (ZOLOFT) 50 MG tablet Take 1 tablet (50 mg total) by mouth daily. For anxiety and depression. (Patient taking differently: Take 25 mg by mouth daily. For anxiety and depression.) 30 tablet 1   No current facility-administered medications on file prior to visit.    BP 126/78   Pulse 88   Temp 97.9 F (36.6 C) (Temporal)   Ht 5\' 1"  (1.549 m)   Wt 194 lb 1.9 oz (88.1 kg)   SpO2 95%   BMI 36.68 kg/m  Objective:   Physical Exam Cardiovascular:     Rate and Rhythm: Normal rate and regular rhythm.  Pulmonary:     Effort: Pulmonary effort is normal.     Breath sounds: Normal breath sounds.  Musculoskeletal:     Cervical back: Neck supple.  Skin:    General: Skin is warm and dry.  Psychiatric:        Mood and Affect: Mood normal.           Assessment & Plan:      This visit occurred during the SARS-CoV-2 public health emergency.  Safety protocols were in place, including screening questions prior to the visit, additional usage of staff PPE, and extensive cleaning of exam room while observing appropriate contact time as indicated for  disinfecting solutions.

## 2021-02-23 ENCOUNTER — Ambulatory Visit (INDEPENDENT_AMBULATORY_CARE_PROVIDER_SITE_OTHER): Payer: BC Managed Care – PPO | Admitting: Psychology

## 2021-02-23 DIAGNOSIS — F331 Major depressive disorder, recurrent, moderate: Secondary | ICD-10-CM | POA: Diagnosis not present

## 2021-02-23 DIAGNOSIS — F411 Generalized anxiety disorder: Secondary | ICD-10-CM

## 2021-02-24 ENCOUNTER — Other Ambulatory Visit: Payer: Self-pay | Admitting: Primary Care

## 2021-02-24 DIAGNOSIS — F339 Major depressive disorder, recurrent, unspecified: Secondary | ICD-10-CM

## 2021-02-24 DIAGNOSIS — F411 Generalized anxiety disorder: Secondary | ICD-10-CM

## 2021-03-05 ENCOUNTER — Other Ambulatory Visit: Payer: Self-pay | Admitting: Primary Care

## 2021-03-05 DIAGNOSIS — F411 Generalized anxiety disorder: Secondary | ICD-10-CM

## 2021-03-05 DIAGNOSIS — F339 Major depressive disorder, recurrent, unspecified: Secondary | ICD-10-CM

## 2021-03-05 NOTE — Telephone Encounter (Signed)
How's she doing on the Lexapro 10 mg for anxiety and depression? Better than Zoloft?  If so then okay to send refills as pended.

## 2021-03-08 NOTE — Telephone Encounter (Signed)
Called patient states that she has been sick so she is "still feeling off" she is not as tired as she was on the zoloft but does feel like the lexapro is making her more numb and disconnected with conversations and people.

## 2021-03-09 DIAGNOSIS — E109 Type 1 diabetes mellitus without complications: Secondary | ICD-10-CM | POA: Diagnosis not present

## 2021-03-09 DIAGNOSIS — E108 Type 1 diabetes mellitus with unspecified complications: Secondary | ICD-10-CM | POA: Diagnosis not present

## 2021-03-09 DIAGNOSIS — Z794 Long term (current) use of insulin: Secondary | ICD-10-CM | POA: Diagnosis not present

## 2021-03-09 NOTE — Telephone Encounter (Signed)
Thanks.  We can try switching to a different medication called venlafaxine ER 37.5 mg or we can continue with lexapro for another month to see how she feels.  She may just need some extra time on Lexapro, but I will leave the decision to her. Let me know.

## 2021-03-10 MED ORDER — VENLAFAXINE HCL ER 37.5 MG PO CP24: 37.5000 mg | ORAL_CAPSULE | Freq: Every day | ORAL | 0 refills | Status: DC

## 2021-03-10 NOTE — Telephone Encounter (Signed)
Prescription for venlafaxine ER 37.5 mg sent to pharmacy.  She will just swap the Lexapro out for the venlafaxine. Same potential side effects (nausea, headache, upset stomach).  I would like to meet with her virtually or in person in 3 to 4 weeks for follow-up, please schedule.

## 2021-03-10 NOTE — Telephone Encounter (Signed)
Called patient she would like to start on the Venlafaxin. Wanted me to call with possible side effects and how to take once called in.   [facility] [address] [facility] [place].

## 2021-03-18 NOTE — Telephone Encounter (Signed)
Patient informed follow up made for virtual

## 2021-03-23 ENCOUNTER — Ambulatory Visit (INDEPENDENT_AMBULATORY_CARE_PROVIDER_SITE_OTHER): Payer: BC Managed Care – PPO | Admitting: Psychology

## 2021-03-23 DIAGNOSIS — F331 Major depressive disorder, recurrent, moderate: Secondary | ICD-10-CM

## 2021-03-23 DIAGNOSIS — F411 Generalized anxiety disorder: Secondary | ICD-10-CM | POA: Diagnosis not present

## 2021-03-29 DIAGNOSIS — F411 Generalized anxiety disorder: Secondary | ICD-10-CM

## 2021-03-29 DIAGNOSIS — F339 Major depressive disorder, recurrent, unspecified: Secondary | ICD-10-CM

## 2021-03-30 MED ORDER — SERTRALINE HCL 25 MG PO TABS
25.0000 mg | ORAL_TABLET | Freq: Every day | ORAL | 0 refills | Status: DC
Start: 2021-03-30 — End: 2022-04-01

## 2021-03-30 NOTE — Telephone Encounter (Signed)
Triage vm from pt asking for response or call about her MyChart message.  Pt can be reached at [phone].

## 2021-03-30 NOTE — Addendum Note (Signed)
Addended by: Doreene Nest on: 03/30/2021 05:54 PM   Modules accepted: Orders

## 2021-04-13 ENCOUNTER — Ambulatory Visit: Payer: BC Managed Care – PPO | Admitting: Psychology

## 2021-04-20 ENCOUNTER — Other Ambulatory Visit: Payer: Self-pay

## 2021-04-20 ENCOUNTER — Ambulatory Visit: Payer: BC Managed Care – PPO | Admitting: Endocrinology

## 2021-04-20 VITALS — BP 122/76 | HR 111 | Ht 61.5 in | Wt 198.0 lb

## 2021-04-20 DIAGNOSIS — E109 Type 1 diabetes mellitus without complications: Secondary | ICD-10-CM | POA: Diagnosis not present

## 2021-04-20 LAB — POCT GLYCOSYLATED HEMOGLOBIN (HGB A1C): Hemoglobin A1C: 6.6 % — AB (ref 4.0–5.6)

## 2021-04-20 NOTE — Progress Notes (Signed)
Subjective:    Patient ID: Jordan Leonard, female    DOB: 22-Mar-1991, 30 y.o.   MRN: [id number]  HPI Pt returns for f/u of diabetes mellitus:  DM type: 1 Dx'ed: 2000 Complications: none Therapy: insulin since dx, and pump rx since 2005 (Tandem pump and Dexcom G6 continuous glucose monitor since 2020).   GDM: never.  DKA: only once, at dx Severe hypoglycemia: never.  Pancreatitis: never Pancreatic imaging: never.  Other: she eats meals at 8 AM, noon, and 6 PM; she works from home now.   Interval history:  She takes these pump settings:  basal rate of 1.2 units/hr, 6AM-9PM, and 1.5/HR overnight (when not in "control-IQ" mode) mealtime bolus of 1 unit/4 grams carbohydrate at all times of day.   correction bolus (which some people call "sensitivity," or "insulin sensitivity ratio," or just "isr") of 1 unit for each 40 by which your glucose exceeds 120.   continuous glucose monitor is downloaded today, and the printout is scanned into the record.  glucose varies from 45-324.  It is in general highest at 2AM, 11AM, and 5PM but there is little trend throughout the day.   TDD is 50 units (58% basal).  Pt says she eats just 2 meals per day.   She is not at risk for another pregnancy now.   "control-IQ" is on 99% of the time.  pt states  Past Medical History:  Diagnosis Date   ADHD    ADHD    Anxiety    Attention deficit hyperactivity disorder (ADHD), combined type 09/01/2016   COVID-19    Diabetes mellitus in pregnancy in third trimester 01/09/2020   Diabetes mellitus type 1 (HCC)    Type 1   Headache    Hormonal Migraines    History of cesarean delivery 09/01/2016   Emergency for fetal status  Pt recollects pp HA    History of cesarean section 09/01/2016   History of chlamydia infection 09/01/2016   2007   Nausea and vomiting during pregnancy 02/29/2020   PONV (postoperative nausea and vomiting)    STD (sexually transmitted disease)    chlaymdia- 10 years ago    Past  Surgical History:  Procedure Laterality Date   CESAREAN SECTION     CESAREAN SECTION Bilateral 03/27/2020   Procedure: CESAREAN SECTION;  Surgeon: Linzie Collin, MD;  Location: [facility] ORS;  Service: Obstetrics;  Laterality: Bilateral;  Repeat C-Section   TOE SURGERY Right 2020   WISDOM TOOTH EXTRACTION Bilateral    Highschool    Social History   Socioeconomic History   Marital status: Married    Spouse name: Jill Alexanders   Number of children: 1   Years of education: Not on file   Highest education level: Not on file  Occupational History   Not on file  Tobacco Use   Smoking status: Never   Smokeless tobacco: Never  Vaping Use   Vaping Use: Never used  Substance and Sexual Activity   Alcohol use: Not Currently    Comment: rare   Drug use: No   Sexual activity: Yes    Birth control/protection: I.U.D.    Comment: Mirena inserted 07/21/2020  Other Topics Concern   Not on file  Social History Narrative   Married.    1 child.   Works at [employer].   Enjoys being with family, working on mind puzzles   Social Determinants of Corporate investment banker Strain: Not on [employer] Insecurity: Not on file  Transportation Needs: Not on file  Physical Activity: Not on file  Stress: Not on file  Social Connections: Not on file  Intimate Partner Violence: Not on file    Current Outpatient Medications on File Prior to Visit  Medication Sig Dispense Refill   Continuous Blood Gluc Sensor (DEXCOM G6 SENSOR) MISC 1 Device by Does not apply route See admin instructions. 9 each 3   glucagon 1 MG injection Inject 1 mg into the skin once as needed.      glucose blood (FREESTYLE PRECISION NEO TEST) test strip Use to monitor glucose levels 8 times per day; E11.9 100 each 12   hydrOXYzine (ATARAX/VISTARIL) 25 MG tablet Take 0.5-1 tablets (12.5-25 mg total) by mouth daily as needed. Only for severe panic attacks 90 tablet 1   insulin aspart (NOVOLOG) 100 UNIT/ML injection USE VIA  INSULIN PUMP. MAX OF 80 UNITS PER DAY. 90 mL 3   Insulin Infusion Pump Supplies (QUICK-SET INFUSION 23" ) MISC 1 Device by Does not apply route every 3 (three) days. 30 each 3   Insulin Infusion Pump Supplies (T:SLIM INSULIN CARTRIDGE ) MISC 1 Device by Does not apply route every 3 (three) days. 30 each 3   levonorgestrel (MIRENA) 20 MCG/24HR IUD 1 each by Intrauterine route once. Inserted 07/21/2020     Prenatal Vit-Fe Fumarate-FA (PRENATAL MULTIVITAMIN) TABS tablet Take 1 tablet by mouth daily at 12 noon.     sertraline (ZOLOFT) 25 MG tablet Take 1 tablet (25 mg total) by mouth daily. For anxiety and depression. 90 tablet 0   No current facility-administered medications on file prior to visit.    Allergies  Allergen Reactions   Amoxicillin Anaphylaxis   Penicillins Anaphylaxis    Family History  Problem Relation Age of Onset   Asthma Sister    Multiple sclerosis Sister    Anxiety disorder Sister    Depression Sister    Depression Mother    Hypertension Mother    Hypertension Father    Breast cancer Neg Hx    Ovarian cancer Neg Hx    Colon cancer Neg Hx    Diabetes Neg Hx    Heart disease Neg Hx    Diabetes gravidarum Neg Hx    Diabetes type I Neg Hx     BP 122/76 (BP Location: Left Arm, Patient Position: Sitting, Cuff Size: Large)   Pulse (!) 111   Ht 5' 1.5" (1.562 m)   Wt 198 lb (89.8 kg)   SpO2 97%   BMI 36.81 kg/m    Review of Systems     Objective:   Physical Exam Pulses: dorsalis pedis intact bilat.   MSK: no deformity of the feet CV: no leg edema Skin:  no ulcer on the feet.  normal color and temp on the feet. Neuro: sensation is intact to touch on the feet.     Lab Results  Component Value Date   HGBA1C 6.6 (A) 04/20/2021      Assessment & Plan:  Type 1 DM. Hypoglycemia, due to insulin: this limits aggressiveness of glycemic control.   Patient Instructions  Please continue these settings:  basal rate of 1.2 units/hr, 6AM-9PM, and  1.5/HR overnight (when not in "control-IQ" mode).  mealtime bolus of 1 unit/4 grams carbohydrate at all times of day.   correction bolus (which some people call "sensitivity," or "insulin sensitivity ratio," or just "isr") of 1 unit for each 40 by which your glucose exceeds 120.   Please come back for a  follow-up appointment in 4 months.

## 2021-04-20 NOTE — Patient Instructions (Addendum)
Please continue these settings:  basal rate of 1.2 units/hr, 6AM-9PM, and 1.5/HR overnight (when not in "control-IQ" mode).  mealtime bolus of 1 unit/4 grams carbohydrate at all times of day.   correction bolus (which some people call "sensitivity," or "insulin sensitivity ratio," or just "isr") of 1 unit for each 40 by which your glucose exceeds 120.   Please come back for a follow-up appointment in 4 months.

## 2021-05-17 DIAGNOSIS — F321 Major depressive disorder, single episode, moderate: Secondary | ICD-10-CM | POA: Diagnosis not present

## 2021-05-24 DIAGNOSIS — F321 Major depressive disorder, single episode, moderate: Secondary | ICD-10-CM | POA: Diagnosis not present

## 2021-05-31 DIAGNOSIS — F321 Major depressive disorder, single episode, moderate: Secondary | ICD-10-CM | POA: Diagnosis not present

## 2021-06-08 DIAGNOSIS — F321 Major depressive disorder, single episode, moderate: Secondary | ICD-10-CM | POA: Diagnosis not present

## 2021-06-14 DIAGNOSIS — F321 Major depressive disorder, single episode, moderate: Secondary | ICD-10-CM | POA: Diagnosis not present

## 2021-06-21 DIAGNOSIS — F321 Major depressive disorder, single episode, moderate: Secondary | ICD-10-CM | POA: Diagnosis not present

## 2021-06-23 ENCOUNTER — Other Ambulatory Visit: Payer: Self-pay | Admitting: Primary Care

## 2021-06-23 DIAGNOSIS — F339 Major depressive disorder, recurrent, unspecified: Secondary | ICD-10-CM

## 2021-06-23 DIAGNOSIS — F411 Generalized anxiety disorder: Secondary | ICD-10-CM

## 2021-06-28 DIAGNOSIS — F321 Major depressive disorder, single episode, moderate: Secondary | ICD-10-CM | POA: Diagnosis not present

## 2021-06-30 DIAGNOSIS — Z794 Long term (current) use of insulin: Secondary | ICD-10-CM | POA: Diagnosis not present

## 2021-06-30 DIAGNOSIS — E108 Type 1 diabetes mellitus with unspecified complications: Secondary | ICD-10-CM | POA: Diagnosis not present

## 2021-06-30 DIAGNOSIS — E109 Type 1 diabetes mellitus without complications: Secondary | ICD-10-CM | POA: Diagnosis not present

## 2021-07-05 DIAGNOSIS — F321 Major depressive disorder, single episode, moderate: Secondary | ICD-10-CM | POA: Diagnosis not present

## 2021-07-06 ENCOUNTER — Encounter: Payer: Self-pay | Admitting: Endocrinology

## 2021-07-06 LAB — HM DIABETES EYE EXAM

## 2021-07-12 DIAGNOSIS — F321 Major depressive disorder, single episode, moderate: Secondary | ICD-10-CM | POA: Diagnosis not present

## 2021-07-14 ENCOUNTER — Other Ambulatory Visit (HOSPITAL_COMMUNITY): Payer: Self-pay

## 2021-07-14 MED ORDER — INFLUENZA VAC SPLIT QUAD 0.5 ML IM SUSY
PREFILLED_SYRINGE | INTRAMUSCULAR | 0 refills | Status: AC
  Filled 2021-07-14: qty 0.5, 1d supply, fill #0

## 2021-07-19 DIAGNOSIS — F321 Major depressive disorder, single episode, moderate: Secondary | ICD-10-CM | POA: Diagnosis not present

## 2021-08-02 DIAGNOSIS — F321 Major depressive disorder, single episode, moderate: Secondary | ICD-10-CM | POA: Diagnosis not present

## 2021-08-09 DIAGNOSIS — F321 Major depressive disorder, single episode, moderate: Secondary | ICD-10-CM | POA: Diagnosis not present

## 2021-08-16 DIAGNOSIS — F321 Major depressive disorder, single episode, moderate: Secondary | ICD-10-CM | POA: Diagnosis not present

## 2021-08-21 ENCOUNTER — Other Ambulatory Visit: Payer: Self-pay | Admitting: Endocrinology

## 2021-08-23 DIAGNOSIS — F321 Major depressive disorder, single episode, moderate: Secondary | ICD-10-CM | POA: Diagnosis not present

## 2021-08-30 DIAGNOSIS — F321 Major depressive disorder, single episode, moderate: Secondary | ICD-10-CM | POA: Diagnosis not present

## 2021-09-06 DIAGNOSIS — F321 Major depressive disorder, single episode, moderate: Secondary | ICD-10-CM | POA: Diagnosis not present

## 2021-09-07 DIAGNOSIS — H1045 Other chronic allergic conjunctivitis: Secondary | ICD-10-CM | POA: Diagnosis not present

## 2021-09-13 ENCOUNTER — Other Ambulatory Visit: Payer: Self-pay

## 2021-09-13 ENCOUNTER — Ambulatory Visit (INDEPENDENT_AMBULATORY_CARE_PROVIDER_SITE_OTHER): Payer: BC Managed Care – PPO | Admitting: Endocrinology

## 2021-09-13 VITALS — BP 110/80 | HR 95 | Ht 61.5 in | Wt 204.0 lb

## 2021-09-13 DIAGNOSIS — E109 Type 1 diabetes mellitus without complications: Secondary | ICD-10-CM | POA: Diagnosis not present

## 2021-09-13 NOTE — Progress Notes (Signed)
Subjective:    Patient ID: Jordan Leonard, female    DOB: March 05, 1991, 30 y.o.   MRN: [id number]  HPI Pt returns for f/u of diabetes mellitus:  DM type: 1 Dx'ed: 2000 Complications: none Therapy: insulin since dx, and pump rx since 2005 (Tandem pump and Dexcom G6 continuous glucose monitor since 2020).   GDM: never.  DKA: only once, at dx Severe hypoglycemia: never.  Pancreatitis: never Pancreatic imaging: never.  Other: she eats meals at 8 AM, noon, and 6 PM; she works from home nowl; she is not considering another pregnancy within the next year; she declines GLP rx   Interval history:  She takes these pump settings: basal rate of 1.3 units/hr, 6AM-9PM, and 1.5/HR overnight (when not in "control-IQ" mode).  mealtime bolus of 1 unit/4 grams carbohydrate at all times of day.   correction bolus (which some people call "sensitivity," or "insulin sensitivity ratio," or just "isr") of 1 unit for each 40 by which your glucose exceeds 120.   continuous glucose monitor is downloaded today, and the printout is scanned into the record.  glucose varies from 0-350.  It is in general highest 11PM-1AM, and lowest at [facility]. but there is little trend throughout the day.   TDD is 52 units (62% basal).    She is not at risk for another pregnancy now.   "control-IQ" is on 98% of the time.   She has hypoglycemia approx twice per week.  She says this is usually due to overcorrecting hyperglycemia.   Past Medical History:  Diagnosis Date   ADHD    ADHD    Anxiety    Attention deficit hyperactivity disorder (ADHD), combined type 09/01/2016   COVID-19    Diabetes mellitus in pregnancy in third trimester 01/09/2020   Diabetes mellitus type 1 (HCC)    Type 1   Headache    Hormonal Migraines    History of cesarean delivery 09/01/2016   Emergency for fetal status  Pt recollects pp HA    History of cesarean section 09/01/2016   History of chlamydia infection 09/01/2016   2007   Nausea and vomiting  during pregnancy 02/29/2020   PONV (postoperative nausea and vomiting)    STD (sexually transmitted disease)    chlaymdia- 10 years ago    Past Surgical History:  Procedure Laterality Date   CESAREAN SECTION     CESAREAN SECTION Bilateral 03/27/2020   Procedure: CESAREAN SECTION;  Surgeon: Linzie Collin, MD;  Location: [facility] ORS;  Service: Obstetrics;  Laterality: Bilateral;  Repeat C-Section   TOE SURGERY Right 2020   WISDOM TOOTH EXTRACTION Bilateral    Highschool    Social History   Socioeconomic History   Marital status: Married    Spouse name: Jill Alexanders   Number of children: 1   Years of education: Not on file   Highest education level: Not on file  Occupational History   Not on file  Tobacco Use   Smoking status: Never   Smokeless tobacco: Never  Vaping Use   Vaping Use: Never used  Substance and Sexual Activity   Alcohol use: Not Currently    Comment: rare   Drug use: No   Sexual activity: Yes    Birth control/protection: I.U.D.    Comment: Mirena inserted 07/21/2020  Other Topics Concern   Not on file  Social History Narrative   Married.    1 child.   Works at [employer].   Enjoys being with family, working on  mind puzzles   Social Determinants of Health   Financial Resource Strain: Not on file  Food Insecurity: Not on file  Transportation Needs: Not on file  Physical Activity: Not on file  Stress: Not on file  Social Connections: Not on file  Intimate Partner Violence: Not on file    Current Outpatient Medications on File Prior to Visit  Medication Sig Dispense Refill   Continuous Blood Gluc Sensor (DEXCOM G6 SENSOR) MISC 1 Device by Does not apply route See admin instructions. 9 each 3   glucagon 1 MG injection Inject 1 mg into the skin once as needed.      glucose blood (FREESTYLE PRECISION NEO TEST) test strip Use to monitor glucose levels 8 times per day; E11.9 100 each 12   hydrOXYzine (ATARAX/VISTARIL) 25 MG tablet Take 0.5-1  tablets (12.5-25 mg total) by mouth daily as needed. Only for severe panic attacks 90 tablet 1   influenza vac split quadrivalent PF (FLUARIX) 0.5 ML injection Inject into the muscle. 0.5 mL 0   Insulin Infusion Pump Supplies (QUICK-SET INFUSION 23" ) MISC 1 Device by Does not apply route every 3 (three) days. 30 each 3   Insulin Infusion Pump Supplies (T:SLIM INSULIN CARTRIDGE ) MISC 1 Device by Does not apply route every 3 (three) days. 30 each 3   levonorgestrel (MIRENA) 20 MCG/24HR IUD 1 each by Intrauterine route once. Inserted 07/21/2020     NOVOLOG 100 UNIT/ML injection USE VIA INSULIN PUMP. MAX OF 80 UNITS PER DAY. 90 mL 3   Prenatal Vit-Fe Fumarate-FA (PRENATAL MULTIVITAMIN) TABS tablet Take 1 tablet by mouth daily at 12 noon.     sertraline (ZOLOFT) 25 MG tablet Take 1 tablet (25 mg total) by mouth daily. For anxiety and depression. 90 tablet 0   No current facility-administered medications on file prior to visit.    Allergies  Allergen Reactions   Amoxicillin Anaphylaxis   Penicillins Anaphylaxis    Family History  Problem Relation Age of Onset   Asthma Sister    Multiple sclerosis Sister    Anxiety disorder Sister    Depression Sister    Depression Mother    Hypertension Mother    Hypertension Father    Breast cancer Neg Hx    Ovarian cancer Neg Hx    Colon cancer Neg Hx    Diabetes Neg Hx    Heart disease Neg Hx    Diabetes gravidarum Neg Hx    Diabetes type I Neg Hx     BP 110/80   Pulse 95   Ht 5' 1.5" (1.562 m)   Wt 204 lb (92.5 kg)   SpO2 96%   BMI 37.92 kg/m   Review of Systems     Objective:   Physical Exam    A1c=6.6%    Assessment & Plan:  Type 1 DM:  Hypoglycemia, due to insulin: this limits aggressiveness of glycemic control.  We discussed.  She declines to adjust rx.    Patient Instructions  Please continue these settings:  basal rate of 1.3 units/hr, 6AM-9PM, and 1.5/HR overnight (when not in "control-IQ" mode).  mealtime  bolus of 1 unit/4 grams carbohydrate at all times of day.   correction bolus (which some people call "sensitivity," or "insulin sensitivity ratio," or just "isr") of 1 unit for each 40 by which your glucose exceeds 120.   Please come back for a follow-up appointment in 4 months.

## 2021-09-13 NOTE — Patient Instructions (Addendum)
Please continue these settings:  basal rate of 1.3 units/hr, 6AM-9PM, and 1.5/HR overnight (when not in "control-IQ" mode).  mealtime bolus of 1 unit/4 grams carbohydrate at all times of day.   correction bolus (which some people call "sensitivity," or "insulin sensitivity ratio," or just "isr") of 1 unit for each 40 by which your glucose exceeds 120.   Please come back for a follow-up appointment in 4 months.

## 2021-09-15 DIAGNOSIS — F321 Major depressive disorder, single episode, moderate: Secondary | ICD-10-CM | POA: Diagnosis not present

## 2021-09-20 ENCOUNTER — Encounter: Payer: BC Managed Care – PPO | Admitting: Obstetrics and Gynecology

## 2021-09-20 DIAGNOSIS — F321 Major depressive disorder, single episode, moderate: Secondary | ICD-10-CM | POA: Diagnosis not present

## 2021-09-21 ENCOUNTER — Encounter: Payer: Self-pay | Admitting: Obstetrics and Gynecology

## 2021-09-21 ENCOUNTER — Ambulatory Visit (INDEPENDENT_AMBULATORY_CARE_PROVIDER_SITE_OTHER): Payer: BC Managed Care – PPO | Admitting: Obstetrics and Gynecology

## 2021-09-21 ENCOUNTER — Other Ambulatory Visit: Payer: Self-pay

## 2021-09-21 ENCOUNTER — Encounter: Payer: BC Managed Care – PPO | Admitting: Obstetrics and Gynecology

## 2021-09-21 VITALS — BP 113/78 | HR 90 | Ht 60.5 in | Wt 204.3 lb

## 2021-09-21 DIAGNOSIS — Z01419 Encounter for gynecological examination (general) (routine) without abnormal findings: Secondary | ICD-10-CM | POA: Diagnosis not present

## 2021-09-21 DIAGNOSIS — E109 Type 1 diabetes mellitus without complications: Secondary | ICD-10-CM

## 2021-09-21 NOTE — Progress Notes (Signed)
Patient presents for physical. Patient reports no birth control concerns, would like to stay with the IUD. Patient reports her hips locking up and a loss of flexibility also reports rib cage flare postpartum around bra area, especially when wearing underwire bras.

## 2021-09-21 NOTE — Progress Notes (Signed)
HPI:      [name] is a 30 y.o. [phone] who LMP was No LMP recorded. (Menstrual status: IUD).  Subjective:   She presents today for her annual examination.  She has complaints of bilateral hip pain left greater than right.  She states that it limits her mobility sometimes and her ability to cross her legs.  Has been present for over a year now. She has an IUD for birth control and she is happy with this method. She reports that her latest hemoglobin A1c was 6.7.  This is an excellent result for her.  She has maintained good glycemic control using an insulin pump.    Hx: The following portions of the patient's history were reviewed and updated as appropriate:             She  has a past medical history of ADHD, ADHD, Anxiety, Attention deficit hyperactivity disorder (ADHD), combined type (09/01/2016), COVID-19, Diabetes mellitus in pregnancy in third trimester (01/09/2020), Diabetes mellitus type 1 ([place]), Headache, History of cesarean delivery (09/01/2016), History of cesarean section (09/01/2016), History of chlamydia infection (09/01/2016), Nausea and vomiting during pregnancy (02/29/2020), PONV (postoperative nausea and vomiting), and STD (sexually transmitted disease). She does not have any pertinent problems on file. She  has a past surgical history that includes Cesarean section; Toe Surgery (Right, 2020); Wisdom tooth extraction (Bilateral); and Cesarean section (Bilateral, 03/27/2020). Her family history includes Anxiety disorder in her sister; Asthma in her sister; Depression in her mother and sister; Hypertension in her father and mother; Multiple sclerosis in her sister. She  reports that she has never smoked. She has never used smokeless tobacco. She reports that she does not currently use alcohol. She reports that she does not use drugs. She has a current medication list which includes the following prescription(s): dexcom g6 sensor, glucagon, glucose blood, hydroxyzine,  quick-set infusion 23" 25mm, t:slim insulin cartridge 57ml, levonorgestrel, novolog, influenza vac split quadrivalent pf, prenatal multivitamin, and sertraline. She is allergic to amoxicillin and penicillins.       Review of Systems:  Review of Systems  Constitutional: Denied constitutional symptoms, night sweats, recent illness, fatigue, fever, insomnia and weight loss.  Eyes: Denied eye symptoms, eye pain, photophobia, vision change and visual disturbance.  Ears/Nose/Throat/Neck: Denied ear, nose, throat or neck symptoms, hearing loss, nasal discharge, sinus congestion and sore throat.  Cardiovascular: Denied cardiovascular symptoms, arrhythmia, chest pain/pressure, edema, exercise intolerance, orthopnea and palpitations.  Respiratory: Denied pulmonary symptoms, asthma, pleuritic pain, productive sputum, cough, dyspnea and wheezing.  Gastrointestinal: Denied, gastro-esophageal reflux, melena, nausea and vomiting.  Genitourinary: Denied genitourinary symptoms including symptomatic vaginal discharge, pelvic relaxation issues, and urinary complaints.  Musculoskeletal: Denied musculoskeletal symptoms, stiffness, swelling, muscle weakness and myalgia.  Dermatologic: Denied dermatology symptoms, rash and scar.  Neurologic: Denied neurology symptoms, dizziness, headache, neck pain and syncope.  Psychiatric: Denied psychiatric symptoms, anxiety and depression.  Endocrine: Denied endocrine symptoms including hot flashes and night sweats.   Meds:   Current Outpatient Medications on File Prior to Visit  Medication Sig Dispense Refill   Continuous Blood Gluc Sensor (DEXCOM G6 SENSOR) MISC 1 Device by Does not apply route See admin instructions. 9 each 3   glucagon 1 MG injection Inject 1 mg into the skin once as needed.      glucose blood (FREESTYLE PRECISION NEO TEST) test strip Use to monitor glucose levels 8 times per day; E11.9 100 each 12   hydrOXYzine (ATARAX/VISTARIL) 25 MG tablet Take 0.5-1  tablets (  12.5-25 mg total) by mouth daily as needed. Only for severe panic attacks 90 tablet 1   Insulin Infusion Pump Supplies (QUICK-SET INFUSION 23" 9MM) MISC 1 Device by Does not apply route every 3 (three) days. 30 each 3   Insulin Infusion Pump Supplies (T:SLIM INSULIN CARTRIDGE 3ML) MISC 1 Device by Does not apply route every 3 (three) days. 30 each 3   levonorgestrel (MIRENA) 20 MCG/24HR IUD 1 each by Intrauterine route once. Inserted 07/21/2020     NOVOLOG 100 UNIT/ML injection USE VIA INSULIN PUMP. MAX OF 80 UNITS PER DAY. 90 mL 3   influenza vac split quadrivalent PF (FLUARIX) 0.5 ML injection Inject into the muscle. (Patient not taking: Reported on 09/21/2021) 0.5 mL 0   Prenatal Vit-Fe Fumarate-FA (PRENATAL MULTIVITAMIN) TABS tablet Take 1 tablet by mouth daily at 12 noon. (Patient not taking: Reported on 09/21/2021)     sertraline (ZOLOFT) 25 MG tablet Take 1 tablet (25 mg total) by mouth daily. For anxiety and depression. (Patient not taking: Reported on 09/21/2021) 90 tablet 0   No current facility-administered medications on file prior to visit.      Objective:     Vitals:   09/21/21 1409  BP: 113/78  Pulse: 90    Filed Weights   09/21/21 1409  Weight: 204 lb 4.8 oz (92.7 kg)              Physical examination General NAD, Conversant  HEENT Atraumatic; Op clear with mmm.  Normo-cephalic. Pupils reactive. Anicteric sclerae  Thyroid/Neck Smooth without nodularity or enlargement. Normal ROM.  Neck Supple.  Skin No rashes, lesions or ulceration. Normal palpated skin turgor. No nodularity.  Breasts: No masses or discharge.  Symmetric.  No axillary adenopathy.  Lungs: Clear to auscultation.No rales or wheezes. Normal Respiratory effort, no retractions.  Heart: NSR.  No murmurs or rubs appreciated. No periferal edema  Abdomen: Soft.  Non-tender.  No masses.  No HSM. No hernia  Extremities: Moves all appropriately.  Normal ROM for age. No lymphadenopathy.  Neuro: Oriented  to PPT.  Normal mood. Normal affect.     Pelvic:   Vulva: Normal appearance.  No lesions.  Vagina: No lesions or abnormalities noted.  Support: Normal pelvic support.  Urethra No masses tenderness or scarring.  Meatus Normal size without lesions or prolapse.  Cervix: Normal appearance.  No lesions.  IUD strings noted  Anus: Normal exam.  No lesions.  Perineum: Normal exam.  No lesions.        Bimanual   Uterus: Normal size.  Non-tender.  Mobile.  AV.  Adnexae: No masses.  Non-tender to palpation.  Cul-de-sac: Negative for abnormality.     Assessment:    [id number] Patient Active Problem List   Diagnosis Date Noted   Pregnancy affected by fetal growth restriction 01/09/2020   Panic attacks 08/19/2019   GAD (generalized anxiety disorder) 03/05/2019   Recurrent major depressive disorder ([place]) 03/05/2019   Toe pain, left 08/15/2018   Breast cyst, right 03/07/2017   Obesity (BMI 30.0-34.9) 09/01/2016   Presence of insulin pump 04/20/2015   Type 1 diabetes mellitus ([place]) 09/24/1999     1. Well woman exam with routine gynecological exam   2. Type 1 diabetes mellitus without complication ([place])        Plan:            1.  Basic Screening Recommendations The basic screening recommendations for asymptomatic women were discussed with the patient during her visit.  The age-appropriate  recommendations were discussed with her and the rational for the tests reviewed.  When I am informed by the patient that another primary care physician has previously obtained the age-appropriate tests and they are up-to-date, only outstanding tests are ordered and referrals given as necessary.  Abnormal results of tests will be discussed with her when all of her results are completed.  Routine preventative health maintenance measures emphasized: Exercise/Diet/Weight control, Tobacco Warnings, Alcohol/Substance use risks and Stress Management Pap next year 2.  Offered referral to orthopedics for hip  issue-patient would like to make this appointment on her own. Orders No orders of the defined types were placed in this encounter.   No orders of the defined types were placed in this encounter.         F/U  Return in about 1 year (around 09/21/2022) for Annual Physical.  Elonda Husky, M.D. 09/21/2021 2:55 PM

## 2021-09-22 DIAGNOSIS — E108 Type 1 diabetes mellitus with unspecified complications: Secondary | ICD-10-CM | POA: Diagnosis not present

## 2021-09-22 DIAGNOSIS — E109 Type 1 diabetes mellitus without complications: Secondary | ICD-10-CM | POA: Diagnosis not present

## 2021-09-22 DIAGNOSIS — Z794 Long term (current) use of insulin: Secondary | ICD-10-CM | POA: Diagnosis not present

## 2021-10-05 DIAGNOSIS — F321 Major depressive disorder, single episode, moderate: Secondary | ICD-10-CM | POA: Diagnosis not present

## 2021-10-09 DIAGNOSIS — K219 Gastro-esophageal reflux disease without esophagitis: Secondary | ICD-10-CM | POA: Diagnosis not present

## 2021-10-11 DIAGNOSIS — F321 Major depressive disorder, single episode, moderate: Secondary | ICD-10-CM | POA: Diagnosis not present

## 2021-10-18 DIAGNOSIS — F321 Major depressive disorder, single episode, moderate: Secondary | ICD-10-CM | POA: Diagnosis not present

## 2021-10-25 DIAGNOSIS — F321 Major depressive disorder, single episode, moderate: Secondary | ICD-10-CM | POA: Diagnosis not present

## 2021-11-01 DIAGNOSIS — F321 Major depressive disorder, single episode, moderate: Secondary | ICD-10-CM | POA: Diagnosis not present

## 2021-11-08 DIAGNOSIS — F321 Major depressive disorder, single episode, moderate: Secondary | ICD-10-CM | POA: Diagnosis not present

## 2021-11-22 DIAGNOSIS — F321 Major depressive disorder, single episode, moderate: Secondary | ICD-10-CM | POA: Diagnosis not present

## 2021-12-06 DIAGNOSIS — F321 Major depressive disorder, single episode, moderate: Secondary | ICD-10-CM | POA: Diagnosis not present

## 2021-12-13 DIAGNOSIS — F321 Major depressive disorder, single episode, moderate: Secondary | ICD-10-CM | POA: Diagnosis not present

## 2021-12-14 ENCOUNTER — Encounter: Payer: Self-pay | Admitting: Endocrinology

## 2021-12-14 NOTE — Telephone Encounter (Signed)
Patient has now been scheduled 

## 2021-12-15 ENCOUNTER — Ambulatory Visit (INDEPENDENT_AMBULATORY_CARE_PROVIDER_SITE_OTHER): Payer: BC Managed Care – PPO | Admitting: Endocrinology

## 2021-12-15 ENCOUNTER — Encounter: Payer: Self-pay | Admitting: Endocrinology

## 2021-12-15 ENCOUNTER — Other Ambulatory Visit: Payer: Self-pay

## 2021-12-15 VITALS — BP 130/88 | HR 97 | Temp 98.7°F | Ht 60.5 in | Wt 197.8 lb

## 2021-12-15 DIAGNOSIS — E109 Type 1 diabetes mellitus without complications: Secondary | ICD-10-CM

## 2021-12-15 LAB — POCT GLYCOSYLATED HEMOGLOBIN (HGB A1C): Hemoglobin A1C: 6.7 % — AB (ref 4.0–5.6)

## 2021-12-15 MED ORDER — DOXYCYCLINE HYCLATE 100 MG PO TABS: 100.0000 mg | ORAL_TABLET | Freq: Two times a day (BID) | ORAL | 0 refills | Status: DC

## 2021-12-15 NOTE — Patient Instructions (Addendum)
I have sent a prescription to your pharmacy, for an antibiotic pill.   ?You should apply moist heat the the area on your abdomen. ?Please call or message [place] next week, if the infection does not improve significantly by then.  ?Please continue these settings:  ?basal rate of 1.3 units/hr, 6AM-9PM, and 1.5/HR overnight (when not in "control-IQ" mode).  ?mealtime bolus of 1 unit/4 grams carbohydrate at all times of day.   ?correction bolus (which some people call "sensitivity," or "insulin sensitivity ratio," or just "isr") of 1 unit for each 60 by which your glucose exceeds 120.   ?Please come back for a follow-up appointment in 4 months.   ?

## 2021-12-15 NOTE — Progress Notes (Signed)
? ?Subjective:  ? ? Patient ID: Jordan Leonard, female    DOB: 1991-05-23, 31 y.o.   MRN: [id number] ? ?HPI ?Pt returns for f/u of diabetes mellitus:  ?DM type: 1 ?Dx'ed: 2000 ?Complications: none ?Therapy: insulin since dx, and pump rx since 2005 (Tandem pump and Dexcom G6 CGM since 2020).   ?GDM: never.  ?DKA: only once, at dx ?Severe hypoglycemia: never.  ?Pancreatitis: never ?Pancreatic imaging: never.  ?Other: she eats meals at 8 AM, noon, and 6 PM; she works from home now; she exercises at [employer]; she is not considering another pregnancy within the next year; she declines GLP rx   ?Interval history:  She takes these pump settings:  ?basal rate of 1.3 units/hr, 6AM-9PM, and 1.5/HR overnight (when not in "control-IQ" mode).  ?mealtime bolus of 1 unit/4 grams carbohydrate at all times of day.   ?correction bolus (which some people call "sensitivity," or "insulin sensitivity ratio," or just "isr") of 1 unit for each 40 by which your glucose exceeds 120.  ?continuous glucose monitor is downloaded today, and the printout is scanned into the record.  glucose varies from 50-310.  It is in general highest 11PM-1AM, and lowest at 8AM. It increases 8AM-1PM, and decreases overnight.   ?TDD is 44 units (67% basal).    ?She is not at risk for another pregnancy now.   ?"control-IQ" is on 98% of the time.   ?She still has hypoglycemia approx BIW.  She says this is still usually due to overcorrecting hyperglycemia.   ?Pt reports 9 days of redness at the abd LLQ area, and assoc pain.  No itching.  The redness has decreased slightly over the past few days.  She is not pregnant or breast feeding now.   ?Past Medical History:  ?Diagnosis Date  ? ADHD   ? ADHD   ? Anxiety   ? Attention deficit hyperactivity disorder (ADHD), combined type 09/01/2016  ? COVID-19   ? Diabetes mellitus in pregnancy in third trimester 01/09/2020  ? Diabetes mellitus type 1 (HCC)   ? Type 1  ? Headache   ? Hormonal Migraines   ? History of cesarean  delivery 09/01/2016  ? Emergency for fetal status  Pt recollects pp HA   ? History of cesarean section 09/01/2016  ? History of chlamydia infection 09/01/2016  ? 2007  ? Nausea and vomiting during pregnancy 02/29/2020  ? PONV (postoperative nausea and vomiting)   ? STD (sexually transmitted disease)   ? chlaymdia- 10 years ago  ? ? ?Past Surgical History:  ?Procedure Laterality Date  ? CESAREAN SECTION    ? CESAREAN SECTION Bilateral 03/27/2020  ? Procedure: CESAREAN SECTION;  Surgeon: Linzie Collin, MD;  Location: [facility] ORS;  Service: Obstetrics;  Laterality: Bilateral;  Repeat C-Section  ? TOE SURGERY Right 2020  ? WISDOM TOOTH EXTRACTION Bilateral   ? Highschool  ? ? ?Social History  ? ?Socioeconomic History  ? Marital status: Married  ?  Spouse name: Jill Alexanders  ? Number of children: 1  ? Years of education: Not on file  ? Highest education level: Not on file  ?Occupational History  ? Not on file  ?Tobacco Use  ? Smoking status: Never  ? Smokeless tobacco: Never  ?Vaping Use  ? Vaping Use: Never used  ?Substance and Sexual Activity  ? Alcohol use: Not Currently  ?  Comment: rare  ? Drug use: No  ? Sexual activity: Yes  ?  Birth control/protection: I.U.D.  ?  Comment: Mirena inserted 07/21/2020  ?Other Topics Concern  ? Not on file  ?Social History Narrative  ? Married.   ? 1 child.  ? Works at [employer].  ? Enjoys being with family, working on mind puzzles  ? ?Social Determinants of Health  ? ?Financial Resource Strain: Not on file  ?Food Insecurity: Not on file  ?Transportation Needs: Not on file  ?Physical Activity: Not on file  ?Stress: Not on file  ?Social Connections: Not on file  ?Intimate Partner Violence: Not on file  ? ? ?Current Outpatient Medications on File Prior to Visit  ?Medication Sig Dispense Refill  ? Continuous Blood Gluc Sensor (DEXCOM G6 SENSOR) MISC 1 Device by Does not apply route See admin instructions. 9 each 3  ? glucagon 1 MG injection Inject 1 mg into the skin once as needed.      ? glucose blood (FREESTYLE PRECISION NEO TEST) test strip Use to monitor glucose levels 8 times per day; E11.9 100 each 12  ? hydrOXYzine (ATARAX/VISTARIL) 25 MG tablet Take 0.5-1 tablets (12.5-25 mg total) by mouth daily as needed. Only for severe panic attacks 90 tablet 1  ? influenza vac split quadrivalent PF (FLUARIX) 0.5 ML injection Inject into the muscle. 0.5 mL 0  ? Insulin Infusion Pump Supplies (QUICK-SET INFUSION 23" ) MISC 1 Device by Does not apply route every 3 (three) days. 30 each 3  ? Insulin Infusion Pump Supplies (T:SLIM INSULIN CARTRIDGE ) MISC 1 Device by Does not apply route every 3 (three) days. 30 each 3  ? levonorgestrel (MIRENA) 20 MCG/24HR IUD 1 each by Intrauterine route once. Inserted 07/21/2020    ? NOVOLOG 100 UNIT/ML injection USE VIA INSULIN PUMP. MAX OF 80 UNITS PER DAY. 90 mL 3  ? Prenatal Vit-Fe Fumarate-FA (PRENATAL MULTIVITAMIN) TABS tablet Take 1 tablet by mouth daily at 12 noon. (Patient not taking: Reported on 09/21/2021)    ? sertraline (ZOLOFT) 25 MG tablet Take 1 tablet (25 mg total) by mouth daily. For anxiety and depression. (Patient not taking: Reported on 09/21/2021) 90 tablet 0  ? ?No current facility-administered medications on file prior to visit.  ? ? ?Allergies  ?Allergen Reactions  ? Amoxicillin Anaphylaxis  ? Penicillins Anaphylaxis  ? ? ?Family History  ?Problem Relation Age of Onset  ? Asthma Sister   ? Multiple sclerosis Sister   ? Anxiety disorder Sister   ? Depression Sister   ? Depression Mother   ? Hypertension Mother   ? Hypertension Father   ? Breast cancer Neg Hx   ? Ovarian cancer Neg Hx   ? Colon cancer Neg Hx   ? Diabetes Neg Hx   ? Heart disease Neg Hx   ? Diabetes gravidarum Neg Hx   ? Diabetes type I Neg Hx   ? ? ?BP 130/88 (BP Location: Left Arm, Patient Position: Sitting, Cuff Size: Normal)   Pulse 97   Temp 98.7 ?F (37.1 ?C)   Ht 5' 0.5" (1.537 m)   Wt 197 lb 12.8 oz (89.7 kg)   SpO2 97%   BMI 37.99 kg/m?  ? ?Review of  Systems ?Denies fever ?   ?Objective:  ? Physical Exam ?VITAL SIGNS:  See vs page ?GENERAL: no distress ?SKIN: LLQ of abd: there is a 8x4 cm area of erythema dn tenderness.  No fluctuance or drainage.   ? ?Lab Results  ?Component Value Date  ? HGBA1C 6.7 (A) 12/15/2021  ? ?   ?Assessment & Plan:  ?Type  1 DM ?Hypoglycemia, due to insulin ?Cellulitis, due to infusion site infection.  ? ?Patient Instructions  ?I have sent a prescription to your pharmacy, for an antibiotic pill.   ?You should apply moist heat the the area on your abdomen. ?Please call or message us next week, if the infection does not improve significantly by then.  ?Please continue these settings:  ?basal rate of 1.3 units/hr, 6AM-9PM, and 1.5/HR overnight (when not in "control-IQ" mode).  ?mealtime bolus of 1 unit/4 grams carbohydrate at all times of day.   ?correction bolus (which some people call "sensitivity," or "insulin sensitivity ratio," or just "isr") of 1 unit for each 60 by which your glucose exceeds 120.   ?Please come back for a follow-up appointment in 4 months.   ? ? ?

## 2021-12-20 DIAGNOSIS — F321 Major depressive disorder, single episode, moderate: Secondary | ICD-10-CM | POA: Diagnosis not present

## 2021-12-20 DIAGNOSIS — Z794 Long term (current) use of insulin: Secondary | ICD-10-CM | POA: Diagnosis not present

## 2021-12-20 DIAGNOSIS — E109 Type 1 diabetes mellitus without complications: Secondary | ICD-10-CM | POA: Diagnosis not present

## 2021-12-20 DIAGNOSIS — E108 Type 1 diabetes mellitus with unspecified complications: Secondary | ICD-10-CM | POA: Diagnosis not present

## 2021-12-27 DIAGNOSIS — F321 Major depressive disorder, single episode, moderate: Secondary | ICD-10-CM | POA: Diagnosis not present

## 2021-12-31 ENCOUNTER — Other Ambulatory Visit: Payer: Self-pay | Admitting: Endocrinology

## 2021-12-31 ENCOUNTER — Encounter: Payer: Self-pay | Admitting: Endocrinology

## 2021-12-31 DIAGNOSIS — E109 Type 1 diabetes mellitus without complications: Secondary | ICD-10-CM

## 2022-01-03 DIAGNOSIS — F321 Major depressive disorder, single episode, moderate: Secondary | ICD-10-CM | POA: Diagnosis not present

## 2022-01-10 DIAGNOSIS — F321 Major depressive disorder, single episode, moderate: Secondary | ICD-10-CM | POA: Diagnosis not present

## 2022-01-17 ENCOUNTER — Ambulatory Visit: Payer: BC Managed Care – PPO | Admitting: Endocrinology

## 2022-01-17 ENCOUNTER — Ambulatory Visit
Admission: EM | Admit: 2022-01-17 | Discharge: 2022-01-17 | Disposition: A | Discharge status: Home / Self Care | Payer: BC Managed Care – PPO | Attending: Emergency Medicine | Admitting: Emergency Medicine

## 2022-01-17 ENCOUNTER — Encounter: Payer: Self-pay | Admitting: Emergency Medicine

## 2022-01-17 DIAGNOSIS — F321 Major depressive disorder, single episode, moderate: Secondary | ICD-10-CM | POA: Diagnosis not present

## 2022-01-17 DIAGNOSIS — J020 Streptococcal pharyngitis: Secondary | ICD-10-CM | POA: Diagnosis not present

## 2022-01-17 LAB — POCT RAPID STREP A (OFFICE): Rapid Strep A Screen: POSITIVE — AB

## 2022-01-17 MED ORDER — AZITHROMYCIN 250 MG PO TABS: 250.0000 mg | ORAL_TABLET | Freq: Every day | ORAL | 0 refills | Status: DC

## 2022-01-17 NOTE — ED Provider Notes (Signed)
?[facility] ? ? ? ?CSN: [id number] ?Arrival date & time: 01/17/22  [id number] ? ? ?  ? ?History   ?Chief Complaint ?Chief Complaint  ?Patient presents with  ? Generalized Body Aches  ? Otalgia  ? Sore Throat  ? ? ?HPI ?Jordan Leonard is a 31 y.o. female.  Patient presents with sore throat, body aches, fatigue, congestion, postnasal drip since yesterday.  She also reports bilateral ear pain today.  Treatment at home with Tylenol and Mucinex DM.  No fever, rash, chest pain, shortness of breath, cough, vomiting, diarrhea, or other symptoms.  Her medical history includes type 1 diabetes, depression, anxiety, panic attacks, ADHD. ? ?The history is provided by the patient and medical records.  ? ?Past Medical History:  ?Diagnosis Date  ? ADHD   ? ADHD   ? Anxiety   ? Attention deficit hyperactivity disorder (ADHD), combined type 09/01/2016  ? COVID-19   ? Diabetes mellitus in pregnancy in third trimester 01/09/2020  ? Diabetes mellitus type 1 (HCC)   ? Type 1  ? Headache   ? Hormonal Migraines   ? History of cesarean delivery 09/01/2016  ? Emergency for fetal status  Pt recollects pp HA   ? History of cesarean section 09/01/2016  ? History of chlamydia infection 09/01/2016  ? 2007  ? Nausea and vomiting during pregnancy 02/29/2020  ? PONV (postoperative nausea and vomiting)   ? STD (sexually transmitted disease)   ? chlaymdia- 10 years ago  ? ? ?Patient Active Problem List  ? Diagnosis Date Noted  ? Pregnancy affected by fetal growth restriction 01/09/2020  ? Panic attacks 08/19/2019  ? GAD (generalized anxiety disorder) 03/05/2019  ? Recurrent major depressive disorder (HCC) 03/05/2019  ? Toe pain, left 08/15/2018  ? Breast cyst, right 03/07/2017  ? Obesity (BMI 30.0-34.9) 09/01/2016  ? Presence of insulin pump 04/20/2015  ? Type 1 diabetes mellitus (HCC) 09/24/1999  ? ? ?Past Surgical History:  ?Procedure Laterality Date  ? CESAREAN SECTION    ? CESAREAN SECTION Bilateral 03/27/2020  ? Procedure: CESAREAN SECTION;   Surgeon: Linzie CollinEvans, David James, MD;  Location: [facility] ORS;  Service: Obstetrics;  Laterality: Bilateral;  Repeat C-Section  ? TOE SURGERY Right 2020  ? WISDOM TOOTH EXTRACTION Bilateral   ? Highschool  ? ? ?OB History   ? ? Gravida  ?2  ? Para  ?1  ? Term  ?1  ? Preterm  ?1  ? AB  ?   ? Living  ?2  ?  ? ? SAB  ?   ? IAB  ?   ? Ectopic  ?   ? Multiple  ?1  ? Live Births  ?2  ?   ?  ?  ? ? ? ?Home Medications   ? ?Prior to Admission medications   ?Medication Sig Start Date End Date Taking? Authorizing Provider  ?azithromycin (ZITHROMAX) 250 MG tablet Take 1 tablet (250 mg total) by mouth daily. Take first 2 tablets together, then 1 every day until finished. 01/17/22  Yes Mickie Bailate, Brigida Scotti H, NP  ?glucagon 1 MG injection Inject 1 mg into the skin once as needed.  06/21/05  Yes [provider]  ?hydrOXYzine (ATARAX/VISTARIL) 25 MG tablet Take 0.5-1 tablets (12.5-25 mg total) by mouth daily as needed. Only for severe panic attacks 04/15/20  Yes Linzie CollinEvans, David James, MD  ?Insulin Infusion Pump Supplies (QUICK-SET INFUSION 23" 9MM) MISC 1 Device by Does not apply route every 3 (three) days. 09/18/20  Yes  Romero Belling, MD  ?Insulin Infusion Pump Supplies (T:SLIM INSULIN CARTRIDGE ) MISC 1 Device by Does not apply route every 3 (three) days. 09/18/20  Yes Romero Belling, MD  ?levonorgestrel (MIRENA) 20 MCG/24HR IUD 1 each by Intrauterine route once. Inserted 07/21/2020   Yes [provider]  ?NOVOLOG 100 UNIT/ML injection USE VIA INSULIN PUMP. MAX OF 80 UNITS PER DAY. 08/23/21  Yes Romero Belling, MD  ?Continuous Blood Gluc Sensor (DEXCOM G6 SENSOR) MISC 1 Device by Does not apply route See admin instructions. 09/18/20   Romero Belling, MD  ?doxycycline (VIBRA-TABS) 100 MG tablet Take 1 tablet (100 mg total) by mouth 2 (two) times daily. 12/15/21   Romero Belling, MD  ?glucose blood (FREESTYLE PRECISION NEO TEST) test strip Use to monitor glucose levels 8 times per day; E11.9 02/18/19   Romero Belling, MD  ?influenza vac  split quadrivalent PF (FLUARIX) 0.5 ML injection Inject into the muscle. 07/14/21   Judyann Munson, MD  ?Prenatal Vit-Fe Fumarate-FA (PRENATAL MULTIVITAMIN) TABS tablet Take 1 tablet by mouth daily at 12 noon. ?Patient not taking: Reported on 09/21/2021    [provider]  ?sertraline (ZOLOFT) 25 MG tablet Take 1 tablet (25 mg total) by mouth daily. For anxiety and depression. ?Patient not taking: Reported on 09/21/2021 03/30/21   Doreene Nest, NP  ? ? ?Family History ?Family History  ?Problem Relation Age of Onset  ? Asthma Sister   ? Multiple sclerosis Sister   ? Anxiety disorder Sister   ? Depression Sister   ? Depression Mother   ? Hypertension Mother   ? Hypertension Father   ? Breast cancer Neg Hx   ? Ovarian cancer Neg Hx   ? Colon cancer Neg Hx   ? Diabetes Neg Hx   ? Heart disease Neg Hx   ? Diabetes gravidarum Neg Hx   ? Diabetes type I Neg Hx   ? ? ?Social History ?Social History  ? ?Tobacco Use  ? Smoking status: Never  ? Smokeless tobacco: Never  ?Vaping Use  ? Vaping Use: Never used  ?Substance Use Topics  ? Alcohol use: Not Currently  ?  Comment: rare  ? Drug use: No  ? ? ? ?Allergies   ?Amoxicillin and Penicillins ? ? ?Review of Systems ?Review of Systems  ?Constitutional:  Positive for fatigue. Negative for chills and fever.  ?HENT:  Positive for congestion, postnasal drip and sore throat. Negative for ear pain.   ?Respiratory:  Negative for cough and shortness of breath.   ?Cardiovascular:  Negative for chest pain and palpitations.  ?Gastrointestinal:  Negative for abdominal pain and vomiting.  ?Skin:  Negative for color change and rash.  ?All other systems reviewed and are negative. ? ? ?Physical Exam ?Triage Vital Signs ?ED Triage Vitals  ?Enc Vitals Group  ?   BP   ?   Pulse   ?   Resp   ?   Temp   ?   Temp src   ?   SpO2   ?   Weight   ?   Height   ?   Head Circumference   ?   Peak Flow   ?   Pain Score   ?   Pain Loc   ?   Pain Edu?   ?   Excl. in GC?   ? ?No data  found. ? ?Updated Vital Signs ?BP 128/87   Pulse (!) 111   Temp 98.2 ?F (36.8 ?C)   Resp  18   SpO2 99%  ? ?Visual Acuity ?Right Eye Distance:   ?Left Eye Distance:   ?Bilateral Distance:   ? ?Right Eye Near:   ?Left Eye Near:    ?Bilateral Near:    ? ?Physical Exam ?Vitals and nursing note reviewed.  ?Constitutional:   ?   General: She is not in acute distress. ?   Appearance: She is well-developed.  ?HENT:  ?   Right Ear: Tympanic membrane normal.  ?   Left Ear: Tympanic membrane normal.  ?   Nose: Nose normal.  ?   Mouth/Throat:  ?   Mouth: Mucous membranes are moist.  ?   Pharynx: Posterior oropharyngeal erythema present.  ?Cardiovascular:  ?   Rate and Rhythm: Normal rate and regular rhythm.  ?   Heart sounds: Normal heart sounds.  ?Pulmonary:  ?   Effort: Pulmonary effort is normal. No respiratory distress.  ?   Breath sounds: Normal breath sounds.  ?Musculoskeletal:  ?   Cervical back: Neck supple.  ?Skin: ?   General: Skin is warm and dry.  ?Neurological:  ?   Mental Status: She is alert.  ?Psychiatric:     ?   Mood and Affect: Mood normal.     ?   Behavior: Behavior normal.  ? ? ? ?UC Treatments / Results  ?Labs ?(all labs ordered are listed, but only abnormal results are displayed) ?Labs Reviewed  ?POCT RAPID STREP A (OFFICE) - Abnormal; Notable for the following components:  ?    Result Value  ? Rapid Strep A Screen Positive (*)   ? All other components within normal limits  ? ? ?EKG ? ? ?Radiology ?No results found. ? ?Procedures ?Procedures (including critical care time) ? ?Medications Ordered in UC ?Medications - No data to display ? ?Initial Impression / Assessment and Plan / UC Course  ?I have reviewed the triage vital signs and the nursing notes. ? ?Pertinent labs & imaging results that were available during my care of the patient were reviewed by me and considered in my medical decision making (see chart for details). ? ?  ?Strep pharyngitis.  Rapid strep positive.  Patient has anaphylactic  reaction to penicillin.  Treating today with Zithromax.  Discussed Tylenol or ibuprofen as needed.  Instructed patient to follow-up with her PCP if her symptoms are not improving.  She agrees to plan of care. ? ?Final Clinical

## 2022-01-17 NOTE — Discharge Instructions (Addendum)
You have strep throat.  Take the Zithromax as directed.  Follow up with your primary care provider if your symptoms are not improving.   ° °

## 2022-01-17 NOTE — ED Triage Notes (Signed)
Pt presents with ST, bilateral ear pain and bodyaches since yesterday.  ?

## 2022-01-24 DIAGNOSIS — F321 Major depressive disorder, single episode, moderate: Secondary | ICD-10-CM | POA: Diagnosis not present

## 2022-01-31 DIAGNOSIS — F321 Major depressive disorder, single episode, moderate: Secondary | ICD-10-CM | POA: Diagnosis not present

## 2022-02-07 DIAGNOSIS — F321 Major depressive disorder, single episode, moderate: Secondary | ICD-10-CM | POA: Diagnosis not present

## 2022-02-14 DIAGNOSIS — F321 Major depressive disorder, single episode, moderate: Secondary | ICD-10-CM | POA: Diagnosis not present

## 2022-02-21 DIAGNOSIS — F321 Major depressive disorder, single episode, moderate: Secondary | ICD-10-CM | POA: Diagnosis not present

## 2022-03-03 DIAGNOSIS — H10431 Chronic follicular conjunctivitis, right eye: Secondary | ICD-10-CM | POA: Diagnosis not present

## 2022-03-07 DIAGNOSIS — F321 Major depressive disorder, single episode, moderate: Secondary | ICD-10-CM | POA: Diagnosis not present

## 2022-03-14 DIAGNOSIS — F321 Major depressive disorder, single episode, moderate: Secondary | ICD-10-CM | POA: Diagnosis not present

## 2022-03-15 DIAGNOSIS — E109 Type 1 diabetes mellitus without complications: Secondary | ICD-10-CM | POA: Diagnosis not present

## 2022-03-15 DIAGNOSIS — Z794 Long term (current) use of insulin: Secondary | ICD-10-CM | POA: Diagnosis not present

## 2022-03-15 DIAGNOSIS — E108 Type 1 diabetes mellitus with unspecified complications: Secondary | ICD-10-CM | POA: Diagnosis not present

## 2022-03-17 DIAGNOSIS — E108 Type 1 diabetes mellitus with unspecified complications: Secondary | ICD-10-CM | POA: Diagnosis not present

## 2022-03-17 DIAGNOSIS — E109 Type 1 diabetes mellitus without complications: Secondary | ICD-10-CM | POA: Diagnosis not present

## 2022-03-17 DIAGNOSIS — Z794 Long term (current) use of insulin: Secondary | ICD-10-CM | POA: Diagnosis not present

## 2022-03-28 DIAGNOSIS — F321 Major depressive disorder, single episode, moderate: Secondary | ICD-10-CM | POA: Diagnosis not present

## 2022-04-01 ENCOUNTER — Encounter: Payer: Self-pay | Admitting: Primary Care

## 2022-04-01 ENCOUNTER — Ambulatory Visit: Payer: BC Managed Care – PPO | Admitting: Primary Care

## 2022-04-01 VITALS — BP 113/74 | HR 88 | Temp 98.6°F | Ht 65.0 in | Wt 195.0 lb

## 2022-04-01 DIAGNOSIS — F330 Major depressive disorder, recurrent, mild: Secondary | ICD-10-CM | POA: Diagnosis not present

## 2022-04-01 DIAGNOSIS — F410 Panic disorder [episodic paroxysmal anxiety] without agoraphobia: Secondary | ICD-10-CM | POA: Diagnosis not present

## 2022-04-01 DIAGNOSIS — F411 Generalized anxiety disorder: Secondary | ICD-10-CM | POA: Diagnosis not present

## 2022-04-01 DIAGNOSIS — E1065 Type 1 diabetes mellitus with hyperglycemia: Secondary | ICD-10-CM

## 2022-04-01 LAB — MICROALBUMIN / CREATININE URINE RATIO
Creatinine,U: 92.8 mg/dL
Microalb Creat Ratio: 0.8 mg/g (ref 0.0–30.0)
Microalb, Ur: <0.7 mg/dL (ref 0.0–1.9)

## 2022-04-01 LAB — LIPID PANEL
Cholesterol: 146 mg/dL (ref 0–200)
HDL: 60.4 mg/dL (ref >39.00)
LDL Cholesterol: 78 mg/dL (ref 0–99)
NonHDL: 85.72
Total CHOL/HDL Ratio: 2
Triglycerides: 38 mg/dL (ref 0.0–149.0)
VLDL: 7.6 mg/dL (ref 0.0–40.0)

## 2022-04-01 LAB — CBC
HCT: 41.3 % (ref 36.0–46.0)
Hemoglobin: 13.7 g/dL (ref 12.0–15.0)
MCHC: 33.2 g/dL (ref 30.0–36.0)
MCV: 86.7 fl (ref 78.0–100.0)
Platelets: 303 10*3/uL (ref 150.0–400.0)
RBC: 4.76 Mil/uL (ref 3.87–5.11)
RDW: 13.7 % (ref 11.5–15.5)
WBC: 6.8 10*3/uL (ref 4.0–10.5)

## 2022-04-01 LAB — COMPREHENSIVE METABOLIC PANEL
ALT: 10 U/L (ref 0–35)
AST: 13 U/L (ref 0–37)
Albumin: 4.2 g/dL (ref 3.5–5.2)
Alkaline Phosphatase: 57 U/L (ref 39–117)
BUN: 17 mg/dL (ref 6–23)
CO2: 30 mEq/L (ref 19–32)
Calcium: 9.5 mg/dL (ref 8.4–10.5)
Chloride: 105 mEq/L (ref 96–112)
Creatinine, Ser: 0.87 mg/dL (ref 0.40–1.20)
GFR: 87.69 mL/min (ref >60.00)
Glucose, Bld: 122 mg/dL — ABNORMAL HIGH (ref 70–99)
Potassium: 4.7 mEq/L (ref 3.5–5.1)
Sodium: 139 mEq/L (ref 135–145)
Total Bilirubin: 0.4 mg/dL (ref 0.2–1.2)
Total Protein: 6.8 g/dL (ref 6.0–8.3)

## 2022-04-01 LAB — TSH: TSH: 2.51 u[IU]/mL (ref 0.35–5.50)

## 2022-04-01 LAB — HEMOGLOBIN A1C: Hgb A1c MFr Bld: 6.9 % — ABNORMAL HIGH (ref 4.6–6.5)

## 2022-04-01 NOTE — Assessment & Plan Note (Signed)
Improving.  Continue PRN use of clonazepam 0.5 mg.

## 2022-04-01 NOTE — Assessment & Plan Note (Signed)
Stable, working with therapy regularly.  Continue weekly therapy. Continue to monitor. Continue off daily medications.

## 2022-04-01 NOTE — Assessment & Plan Note (Signed)
Improving.  Continue regular therapy. Continue off daily medications.  Continue PRN use of clonazepam 0.5 mg for which she uses sparingly.

## 2022-04-01 NOTE — Patient Instructions (Signed)
Stop by the lab prior to leaving today. I will notify you of your results once received.   It was a pleasure to see you today!  

## 2022-04-01 NOTE — Progress Notes (Signed)
Subjective:    Patient ID: Jordan Leonard, female    DOB: Sep 08, 1991, 31 y.o.   MRN: [id number]  Anxiety Symptoms include nervous/anxious behavior. Patient reports no chest pain, dizziness or shortness of breath.      Jordan Leonard is a very pleasant 31 y.o. female with a history of type 1 diabetes, GAD, MDD, panic attacks who presents today for follow up of chronic conditions and to discuss anxiety.  1) Type 1 Diabetes: Following with endocrinology, managed on insulin pump. Last A1C was 6.7 in March 2023. She is due for urine microalbumin.  She completes her eye exam annually.   2) GAD/MDD: Chronic. Previously managed on Zoloft 25 mg daily but stopped taking as she didn't want to take medications and didn't notice a significant improvement. She could not take Zoloft 50 mg as it causes too much fatiuge. She is currently managed on clonazepam 0.5 mg PRN for which she uses very sparingly. She has 35 tablets left over from last year.   Today she endorses feeling improved overall , reduced irritability. She is participating EMDR therapy, participates with her therapist once weekly and is making progress. She's been exercising and walking more.   BP Readings from Last 3 Encounters:  04/01/22 113/74  01/17/22 128/87  12/15/21 130/88      Review of Systems  Respiratory:  Negative for shortness of breath.   Cardiovascular:  Negative for chest pain.  Gastrointestinal:  Negative for constipation and diarrhea.  Neurological:  Negative for dizziness.  Psychiatric/Behavioral:  The patient is nervous/anxious.        See HPI         Past Medical History:  Diagnosis Date   ADHD    ADHD    Anxiety    Attention deficit hyperactivity disorder (ADHD), combined type 09/01/2016   COVID-19    Diabetes mellitus in pregnancy in third trimester 01/09/2020   Diabetes mellitus type 1 (HCC)    Type 1   Headache    Hormonal Migraines    History of cesarean delivery 09/01/2016    Emergency for fetal status  Pt recollects pp HA    History of cesarean section 09/01/2016   History of chlamydia infection 09/01/2016   2007   Nausea and vomiting during pregnancy 02/29/2020   PONV (postoperative nausea and vomiting)    STD (sexually transmitted disease)    chlaymdia- 10 years ago    Social History   Socioeconomic History   Marital status: Married    Spouse name: Jill Alexanders   Number of children: 1   Years of education: Not on file   Highest education level: Not on file  Occupational History   Not on file  Tobacco Use   Smoking status: Never   Smokeless tobacco: Never  Vaping Use   Vaping Use: Never used  Substance and Sexual Activity   Alcohol use: Not Currently    Comment: rare   Drug use: No   Sexual activity: Yes    Birth control/protection: I.U.D.    Comment: Mirena inserted 07/21/2020  Other Topics Concern   Not on file  Social History Narrative   Married.    1 child.   Works at [employer].   Enjoys being with family, working on mind puzzles   Social Determinants of Corporate investment banker Strain: Not on [employer] Insecurity: Not on file  Transportation Needs: Not on file  Physical Activity: Inactive (09/05/2017)   Exercise Vital Sign  Days of Exercise per Week: 0 days    Minutes of Exercise per Session: 0 min  Stress: Not on file  Social Connections: Not on file  Intimate Partner Violence: Not At Risk (09/05/2017)   Humiliation, Afraid, Rape, and Kick questionnaire    Fear of Current or Ex-Partner: No    Emotionally Abused: No    Physically Abused: No    Sexually Abused: No    Past Surgical History:  Procedure Laterality Date   CESAREAN SECTION     CESAREAN SECTION Bilateral 03/27/2020   Procedure: CESAREAN SECTION;  Surgeon: Linzie Collin, MD;  Location: [facility] ORS;  Service: Obstetrics;  Laterality: Bilateral;  Repeat C-Section   TOE SURGERY Right 2020   WISDOM TOOTH EXTRACTION Bilateral    Highschool    Family  History  Problem Relation Age of Onset   Asthma Sister    Multiple sclerosis Sister    Anxiety disorder Sister    Depression Sister    Depression Mother    Hypertension Mother    Hypertension Father    Breast cancer Neg Hx    Ovarian cancer Neg Hx    Colon cancer Neg Hx    Diabetes Neg Hx    Heart disease Neg Hx    Diabetes gravidarum Neg Hx    Diabetes type I Neg Hx     Allergies  Allergen Reactions   Amoxicillin Anaphylaxis   Penicillins Anaphylaxis    Current Outpatient Medications on File Prior to Visit  Medication Sig Dispense Refill   Continuous Blood Gluc Sensor (DEXCOM G6 SENSOR) MISC 1 Device by Does not apply route See admin instructions. 9 each 3   glucagon 1 MG injection Inject 1 mg into the skin once as needed.      glucose blood (FREESTYLE PRECISION NEO TEST) test strip Use to monitor glucose levels 8 times per day; E11.9 100 each 12   hydrOXYzine (ATARAX/VISTARIL) 25 MG tablet Take 0.5-1 tablets (12.5-25 mg total) by mouth daily as needed. Only for severe panic attacks 90 tablet 1   influenza vac split quadrivalent PF (FLUARIX) 0.5 ML injection Inject into the muscle. 0.5 mL 0   Insulin Infusion Pump Supplies (QUICK-SET INFUSION 23" ) MISC 1 Device by Does not apply route every 3 (three) days. 30 each 3   Insulin Infusion Pump Supplies (T:SLIM INSULIN CARTRIDGE ) MISC 1 Device by Does not apply route every 3 (three) days. 30 each 3   levonorgestrel (MIRENA) 20 MCG/24HR IUD 1 each by Intrauterine route once. Inserted 07/21/2020     NOVOLOG 100 UNIT/ML injection USE VIA INSULIN PUMP. MAX OF 80 UNITS PER DAY. 90 mL 3   No current facility-administered medications on file prior to visit.    BP 113/74   Pulse 88   Temp 98.6 F (37 C) (Oral)   Ht 5\' 5"  (1.651 m)   Wt 195 lb (88.5 kg)   SpO2 97%   BMI 32.45 kg/m  Objective:   Physical Exam Cardiovascular:     Rate and Rhythm: Normal rate and regular rhythm.  Pulmonary:     Effort: Pulmonary effort is  normal.     Breath sounds: Normal breath sounds.  Musculoskeletal:     Cervical back: Neck supple.  Skin:    General: Skin is warm and dry.  Neurological:     Mental Status: She is alert.  Psychiatric:        Mood and Affect: Mood normal.  Assessment & Plan:   Problem List Items Addressed This Visit   None      Doreene Nest, NP

## 2022-04-01 NOTE — Assessment & Plan Note (Addendum)
Controlled, following with endocrinology. A1C reviewed from March 2023.  Continue insulin pump as prescribed.  Eye exam UTD. Urine microalbumin due and pending.  Will also update CMP and lipids.

## 2022-04-04 DIAGNOSIS — F321 Major depressive disorder, single episode, moderate: Secondary | ICD-10-CM | POA: Diagnosis not present

## 2022-04-08 ENCOUNTER — Telehealth: Payer: Self-pay

## 2022-04-08 NOTE — Telephone Encounter (Signed)
Patient has been scheduled at [facility] and doesn't need to be seen.

## 2022-04-11 DIAGNOSIS — F321 Major depressive disorder, single episode, moderate: Secondary | ICD-10-CM | POA: Diagnosis not present

## 2022-04-18 DIAGNOSIS — F321 Major depressive disorder, single episode, moderate: Secondary | ICD-10-CM | POA: Diagnosis not present

## 2022-04-25 DIAGNOSIS — F321 Major depressive disorder, single episode, moderate: Secondary | ICD-10-CM | POA: Diagnosis not present

## 2022-05-02 DIAGNOSIS — F321 Major depressive disorder, single episode, moderate: Secondary | ICD-10-CM | POA: Diagnosis not present

## 2022-05-09 DIAGNOSIS — F321 Major depressive disorder, single episode, moderate: Secondary | ICD-10-CM | POA: Diagnosis not present

## 2022-05-23 DIAGNOSIS — F321 Major depressive disorder, single episode, moderate: Secondary | ICD-10-CM | POA: Diagnosis not present

## 2022-05-26 DIAGNOSIS — E109 Type 1 diabetes mellitus without complications: Secondary | ICD-10-CM | POA: Diagnosis not present

## 2022-06-10 DIAGNOSIS — E109 Type 1 diabetes mellitus without complications: Secondary | ICD-10-CM | POA: Diagnosis not present

## 2022-06-10 DIAGNOSIS — Z794 Long term (current) use of insulin: Secondary | ICD-10-CM | POA: Diagnosis not present

## 2022-06-10 DIAGNOSIS — E108 Type 1 diabetes mellitus with unspecified complications: Secondary | ICD-10-CM | POA: Diagnosis not present

## 2022-06-13 DIAGNOSIS — F321 Major depressive disorder, single episode, moderate: Secondary | ICD-10-CM | POA: Diagnosis not present

## 2022-06-20 DIAGNOSIS — F321 Major depressive disorder, single episode, moderate: Secondary | ICD-10-CM | POA: Diagnosis not present

## 2022-06-27 DIAGNOSIS — F321 Major depressive disorder, single episode, moderate: Secondary | ICD-10-CM | POA: Diagnosis not present

## 2022-07-11 DIAGNOSIS — F321 Major depressive disorder, single episode, moderate: Secondary | ICD-10-CM | POA: Diagnosis not present

## 2022-07-12 ENCOUNTER — Other Ambulatory Visit (HOSPITAL_BASED_OUTPATIENT_CLINIC_OR_DEPARTMENT_OTHER): Payer: Self-pay

## 2022-07-12 MED ORDER — FLUARIX QUADRIVALENT 0.5 ML IM SUSY
PREFILLED_SYRINGE | INTRAMUSCULAR | 0 refills | Status: AC
Start: 2022-07-12 — End: ?
  Filled 2022-07-12: qty 0.5, 1d supply, fill #0

## 2022-07-18 ENCOUNTER — Encounter: Payer: Self-pay | Admitting: Obstetrics and Gynecology

## 2022-07-18 DIAGNOSIS — F321 Major depressive disorder, single episode, moderate: Secondary | ICD-10-CM | POA: Diagnosis not present

## 2022-07-19 ENCOUNTER — Encounter: Payer: Self-pay | Admitting: Obstetrics and Gynecology

## 2022-07-19 DIAGNOSIS — R1010 Upper abdominal pain, unspecified: Secondary | ICD-10-CM | POA: Diagnosis not present

## 2022-07-25 DIAGNOSIS — F321 Major depressive disorder, single episode, moderate: Secondary | ICD-10-CM | POA: Diagnosis not present

## 2022-08-08 DIAGNOSIS — F321 Major depressive disorder, single episode, moderate: Secondary | ICD-10-CM | POA: Diagnosis not present

## 2022-08-29 DIAGNOSIS — F321 Major depressive disorder, single episode, moderate: Secondary | ICD-10-CM | POA: Diagnosis not present

## 2022-09-05 DIAGNOSIS — E108 Type 1 diabetes mellitus with unspecified complications: Secondary | ICD-10-CM | POA: Diagnosis not present

## 2022-09-05 DIAGNOSIS — Z794 Long term (current) use of insulin: Secondary | ICD-10-CM | POA: Diagnosis not present

## 2022-09-05 DIAGNOSIS — E109 Type 1 diabetes mellitus without complications: Secondary | ICD-10-CM | POA: Diagnosis not present

## 2022-09-05 DIAGNOSIS — F321 Major depressive disorder, single episode, moderate: Secondary | ICD-10-CM | POA: Diagnosis not present

## 2022-09-19 DIAGNOSIS — F321 Major depressive disorder, single episode, moderate: Secondary | ICD-10-CM | POA: Diagnosis not present

## 2022-09-22 ENCOUNTER — Encounter: Payer: Self-pay | Admitting: Obstetrics and Gynecology

## 2022-09-22 ENCOUNTER — Encounter: Payer: BC Managed Care – PPO | Admitting: Obstetrics and Gynecology

## 2022-09-22 ENCOUNTER — Other Ambulatory Visit (HOSPITAL_COMMUNITY)
Admission: RE | Admit: 2022-09-22 | Discharge: 2022-09-22 | Disposition: A | Discharge status: Home / Self Care | Payer: BC Managed Care – PPO | Source: Ambulatory Visit | Attending: Obstetrics and Gynecology | Admitting: Obstetrics and Gynecology

## 2022-09-22 ENCOUNTER — Ambulatory Visit (INDEPENDENT_AMBULATORY_CARE_PROVIDER_SITE_OTHER): Payer: BC Managed Care – PPO | Admitting: Obstetrics and Gynecology

## 2022-09-22 VITALS — BP 120/80 | HR 103 | Ht 65.0 in | Wt 188.0 lb

## 2022-09-22 DIAGNOSIS — E109 Type 1 diabetes mellitus without complications: Secondary | ICD-10-CM | POA: Diagnosis not present

## 2022-09-22 DIAGNOSIS — M25551 Pain in right hip: Secondary | ICD-10-CM

## 2022-09-22 DIAGNOSIS — Z124 Encounter for screening for malignant neoplasm of cervix: Secondary | ICD-10-CM | POA: Diagnosis not present

## 2022-09-22 DIAGNOSIS — Z01419 Encounter for gynecological examination (general) (routine) without abnormal findings: Secondary | ICD-10-CM

## 2022-09-22 NOTE — Progress Notes (Signed)
GYNECOLOGY ANNUAL PHYSICAL EXAM PROGRESS NOTE  Subjective:    Jordan Leonard is a 31 y.o. G30P1102 female who presents for an annual exam. . The patient is sexually active. The patient participates in regular exercise: yes. Has the patient ever been transfused or tattooed?: yes. The patient reports that there is not domestic violence in her life.   The patient has complaints of hip pain today (mostly right sided). Notes that since the birth of her last child 2 years ago, her hip flexion has slowly worsened. Unable to sit in certain positions and prolonged standing can aggravate it. Unsure of what provider she would need to address this.    Menstrual History: Menarche age: 17 No LMP recorded. (Menstrual status: IUD).     Gynecologic History:  Contraception: IUD History of STI's: Denies Last Pap: 10/01/2022. Results were: normal.  Denies h/o abnormal pap smears.   OB History  Gravida Para Term Preterm AB Living  2 1 1 1  0 2  SAB IAB Ectopic Multiple Live Births  0 0 0 1 2    # Outcome Date GA Lbr Len/2nd Weight Sex Delivery Anes PTL Lv  2A Preterm 05/23/14   7 lb 5 oz (3.317 kg) F CS-Classical   LIV  2B Term 03/27/20 [redacted]w[redacted]d  6 lb 8.8 oz (2.97 kg) F CS-LTranv Spinal  LIV     Name: Jordan Leonard     Apgar1: 9  Apgar5: 9  1 Gravida             Past Medical History:  Diagnosis Date   ADHD    ADHD    Anxiety    Attention deficit hyperactivity disorder (ADHD), combined type 09/01/2016   COVID-19    Diabetes mellitus in pregnancy in third trimester 01/09/2020   Diabetes mellitus type 1 ([place])    Type 1   Headache    Hormonal Migraines    History of cesarean delivery 09/01/2016   Emergency for fetal status  Pt recollects pp HA    History of cesarean section 09/01/2016   History of chlamydia infection 09/01/2016   2007   Nausea and vomiting during pregnancy 02/29/2020   PONV (postoperative nausea and vomiting)    Pregnancy affected by fetal growth restriction  01/09/2020   7th percentile 01/09/20 AC 9th    STD (sexually transmitted disease)    chlaymdia- 10 years ago    Past Surgical History:  Procedure Laterality Date   CESAREAN SECTION     CESAREAN SECTION Bilateral 03/27/2020   Procedure: CESAREAN SECTION;  Surgeon: Harlin Heys, MD;  Location: [facility] ORS;  Service: Obstetrics;  Laterality: Bilateral;  Repeat C-Section   TOE SURGERY Right 2020   WISDOM TOOTH EXTRACTION Bilateral    Highschool    Family History  Problem Relation Age of Onset   Asthma Sister    Multiple sclerosis Sister    Anxiety disorder Sister    Depression Sister    Depression Mother    Hypertension Mother    Hypertension Father    Breast cancer Neg Hx    Ovarian cancer Neg Hx    Colon cancer Neg Hx    Diabetes Neg Hx    Heart disease Neg Hx    Diabetes gravidarum Neg Hx    Diabetes type I Neg Hx     Social History   Socioeconomic History   Marital status: Married    Spouse name: Larkin Ina   Number of children: 1   Years of  education: Not on file   Highest education level: Not on file  Occupational History   Not on file  Tobacco Use   Smoking status: Never   Smokeless tobacco: Never  Vaping Use   Vaping Use: Never used  Substance and Sexual Activity   Alcohol use: Not Currently    Comment: rare   Drug use: No   Sexual activity: Yes    Birth control/protection: I.U.D.    Comment: Mirena inserted 07/21/2020  Other Topics Concern   Not on file  Social History Narrative   Married.    1 child.   Works at [employer].   Enjoys being with family, working on mind puzzles   Social Determinants of Radio broadcast assistant Strain: Not on [employer] Insecurity: Not on file  Transportation Needs: Not on file  Physical Activity: Inactive (09/05/2017)   Exercise Vital Sign    Days of Exercise per Week: 0 days    Minutes of Exercise per Session: 0 min  Stress: Not on file  Social Connections: Not on file  Intimate Partner Violence: Not  At Risk (09/05/2017)   Humiliation, Afraid, Rape, and Kick questionnaire    Fear of Current or Ex-Partner: No    Emotionally Abused: No    Physically Abused: No    Sexually Abused: No    Current Outpatient Medications on File Prior to Visit  Medication Sig Dispense Refill   clonazePAM (KLONOPIN) 0.5 MG tablet Take 0.5 mg by mouth daily as needed.     Continuous Blood Gluc Sensor (DEXCOM G6 SENSOR) MISC 1 Device by Does not apply route See admin instructions. 9 each 3   glucagon 1 MG injection Inject 1 mg into the skin once as needed.      glucose blood (FREESTYLE PRECISION NEO TEST) test strip Use to monitor glucose levels 8 times per day; E11.9 100 each 12   hydrOXYzine (ATARAX/VISTARIL) 25 MG tablet Take 0.5-1 tablets (12.5-25 mg total) by mouth daily as needed. Only for severe panic attacks 90 tablet 1   influenza vac split quadrivalent PF (FLUARIX QUADRIVALENT) 0.5 ML injection Inject into the muscle. 0.5 mL 0   influenza vac split quadrivalent PF (FLUARIX) 0.5 ML injection Inject into the muscle. 0.5 mL 0   Insulin Infusion Pump Supplies (QUICK-SET INFUSION 23" 9MM) MISC 1 Device by Does not apply route every 3 (three) days. 30 each 3   Insulin Infusion Pump Supplies (T:SLIM INSULIN CARTRIDGE 3ML) MISC 1 Device by Does not apply route every 3 (three) days. 30 each 3   levonorgestrel (MIRENA) 20 MCG/24HR IUD 1 each by Intrauterine route once. Inserted 07/21/2020     NOVOLOG 100 UNIT/ML injection USE VIA INSULIN PUMP. MAX OF 80 UNITS PER DAY. 90 mL 3   No current facility-administered medications on file prior to visit.    Allergies  Allergen Reactions   Amoxicillin Anaphylaxis   Penicillins Anaphylaxis     Review of Systems Constitutional: negative for chills, fatigue, fevers and sweats Eyes: negative for irritation, redness and visual disturbance Ears, nose, mouth, throat, and face: negative for hearing loss, nasal congestion, snoring and tinnitus Respiratory: negative for  asthma, cough, sputum Cardiovascular: negative for chest pain, dyspnea, exertional chest pressure/discomfort, irregular heart beat, palpitations and syncope Gastrointestinal: negative for abdominal pain, change in bowel habits, nausea and vomiting Genitourinary: negative for abnormal menstrual periods, genital lesions, sexual problems and vaginal discharge, dysuria and urinary incontinence Integument/breast: negative for breast lump, breast tenderness and nipple discharge  Hematologic/lymphatic: negative for bleeding and easy bruising Musculoskeletal:negative for back pain and muscle weakness Neurological: negative for dizziness, headaches, vertigo and weakness Endocrine: negative for diabetic symptoms including polydipsia, polyuria and skin dryness Allergic/Immunologic: negative for hay fever and urticaria      Objective:  Blood pressure 120/80, pulse (!) 103, height 5\' 5"  (1.651 m), weight 188 lb (85.3 kg), unknown if currently breastfeeding. Body mass index is 31.28 kg/m.    General Appearance:    Alert, cooperative, no distress, appears stated age  Head:    Normocephalic, without obvious abnormality, atraumatic  Eyes:    PERRL, conjunctiva/corneas clear, EOM's intact, both eyes  Ears:    Normal external ear canals, both ears  Nose:   Nares normal, septum midline, mucosa normal, no drainage or sinus tenderness  Throat:   Lips, mucosa, and tongue normal; teeth and gums normal  Neck:   Supple, symmetrical, trachea midline, no adenopathy; thyroid: no enlargement/tenderness/nodules; no carotid bruit or JVD  Back:     Symmetric, no curvature, ROM normal, no CVA tenderness  Lungs:     Clear to auscultation bilaterally, respirations unlabored  Chest Wall:    No tenderness or deformity   Heart:    Regular rate and rhythm, S1 and S2 normal, no murmur, rub or gallop  Breast Exam:    No tenderness, masses, or nipple abnormality  Abdomen:     Soft, non-tender, bowel sounds active all four  quadrants, no masses, no organomegaly.    Genitalia:    Pelvic:external genitalia normal, vagina without lesions, discharge, or tenderness, rectovaginal septum  normal. Cervix normal in appearance, no cervical motion tenderness, no adnexal masses or tenderness.  Uterus normal size, shape, mobile, regular contours, nontender.  Rectal:    Normal external sphincter.  No hemorrhoids appreciated. Internal exam not done.   Extremities:   Extremities normal, atraumatic, no cyanosis or edema  Pulses:   2+ and symmetric all extremities  Skin:   Skin color, texture, turgor normal, no rashes or lesions  Lymph nodes:   Cervical, supraclavicular, and axillary nodes normal  Neurologic:   CNII-XII intact, normal strength, sensation and reflexes throughout   .  Labs:  Lab Results  Component Value Date   WBC 6.8 04/01/2022   HGB 13.7 04/01/2022   HCT 41.3 04/01/2022   MCV 86.7 04/01/2022   PLT 303.0 04/01/2022    Lab Results  Component Value Date   CREATININE 0.87 04/01/2022   BUN 17 04/01/2022   NA 139 04/01/2022   K 4.7 04/01/2022   CL 105 04/01/2022   CO2 30 04/01/2022    Lab Results  Component Value Date   ALT 10 04/01/2022   AST 13 04/01/2022   ALKPHOS 57 04/01/2022   BILITOT 0.4 04/01/2022    Lab Results  Component Value Date   TSH 2.51 04/01/2022    Assessment:   1. Well woman exam with routine gynecological exam   2. Cervical cancer screening   3. Type 1 diabetes mellitus without complication (HCC)   4. Pain of right hip      Plan:  - Blood tests: Done by PCP. Notes DM is the best it has been, with A1c of 6.5 last month. Has insulin pump.  - Breast self exam technique reviewed and patient encouraged to perform self-exam monthly. - Contraception: IUD. Informed that IUD was now good for 8 years. Patient notes she and partner may be considering trying for a third pregnancy but not in immediate future.  -  Discussed healthy lifestyle modifications. - Mammogram  to begin  screens at age 8.   - Pap smear ordered. - Discussed management options for hip pain, including chiropractor, physical therapy. Patient will start with chiropractor. - Follow up in 1 year for annual exam   Hildred Laser, MD [facility]

## 2022-09-22 NOTE — Patient Instructions (Signed)
Breast Self-Awareness Breast self-awareness is knowing how your breasts look and feel. You need to: Check your breasts on a regular basis. Tell your doctor about any changes. Become familiar with the look and feel of your breasts. This can help you catch a breast problem while it is still small and can be treated. You should do breast self-exams even if you have breast implants. What you need: A mirror. A well-lit room. A pillow or other soft object. How to do a breast self-exam Follow these steps to do a breast self-exam: Look for changes  Take off all the clothes above your waist. Stand in front of a mirror in a room with good lighting. Put your hands down at your sides. Compare your breasts in the mirror. Look for any difference between them, such as: A difference in shape. A difference in size. Wrinkles, dips, and bumps in one breast and not the other. Look at each breast for changes in the skin, such as: Redness. Scaly areas. Skin that has gotten thicker. Dimpling. Open sores (ulcers). Look for changes in your nipples, such as: Fluid coming out of a nipple. Fluid around a nipple. Bleeding. Dimpling. Redness. A nipple that looks pushed in (retracted), or that has changed position. Feel for changes Lie on your back. Feel each breast. To do this: Pick a breast to feel. Place a pillow under the shoulder closest to that breast. Put the arm closest to that breast behind your head. Feel the nipple area of that breast using the hand of your other arm. Feel the area with the pads of your three middle fingers by making small circles with your fingers. Use light, medium, and firm pressure. Continue the overlapping circles, moving downward over the breast. Keep making circles with your fingers. Stop when you feel your ribs. Start making circles with your fingers again, this time going upward until you reach your collarbone. Then, make circles outward across your breast and into your  armpit area. Squeeze your nipple. Check for discharge and lumps. Repeat these steps to check your other breast. Sit or stand in the tub or shower. With soapy water on your skin, feel each breast the same way you did when you were lying down. Write down what you find Writing down what you find can help you remember what to tell your doctor. Write down: What is normal for each breast. Any changes you find in each breast. These include: The kind of changes you find. A tender or painful breast. Any lump you find. Write down its size and where it is. When you last had your monthly period (menstrual cycle). General tips If you are breastfeeding, the best time to check your breasts is after you feed your baby or after you use a breast pump. If you get monthly bleeding, the best time to check your breasts is 5-7 days after your monthly cycle ends. With time, you will become comfortable with the self-exam. You will also start to know if there are changes in your breasts. Contact a doctor if: You see a change in the shape or size of your breasts or nipples. You see a change in the skin of your breast or nipples, such as red or scaly skin. You have fluid coming from your nipples that is not normal. You find a new lump or thick area. You have breast pain. You have any concerns about your breast health. Summary Breast self-awareness includes looking for changes in your breasts and feeling for changes   yoga, or listening to music. Journaling. Talking to a trusted person. Spending time with friends and family. Minimize exposure to UV radiation to reduce your risk of skin  cancer. Safety Always wear your seat belt while driving or riding in a vehicle. Do not drive: If you have been drinking alcohol. Do not ride with someone who has been drinking. If you have been using any mind-altering substances or drugs. While texting. When you are tired or distracted. Wear a helmet and other protective equipment during sports activities. If you have firearms in your house, make sure you follow all gun safety procedures. Seek help if you have been physically or sexually abused. What's next? Go to your health care provider once a year for an annual wellness visit. Ask your health care provider how often you should have your eyes and teeth checked. Stay up to date on all vaccines. This information is not intended to replace advice given to you by your health care provider. Make sure you discuss any questions you have with your health care provider. Document Revised: 03/17/2021 Document Reviewed: 03/17/2021 Elsevier Patient Education  2023 [employer]    night with fluoride toothpaste. Floss one time each day. Exercise for at least 30 minutes 5 or more days each week. Do not use any products that contain nicotine or tobacco. These products include cigarettes, chewing tobacco, and vaping devices, such as e-cigarettes. If you need help quitting, ask your health care provider. Do not use drugs. If you are sexually active, practice safe sex. Use a condom or other form of protection to prevent STIs. If you do not wish to become pregnant, use a form of birth control. If you plan to become pregnant, see your health care provider for a prepregnancy visit. Find healthy ways to manage stress, such as: Meditation, yoga, or  listening to music. Journaling. Talking to a trusted person. Spending time with friends and family. Minimize exposure to UV radiation to reduce your risk of skin cancer. Safety Always wear your seat belt while driving or riding in a vehicle. Do not drive: If you have been drinking alcohol. Do not ride with someone who has been drinking. If you have been using any mind-altering substances or drugs. While texting. When you are tired or distracted. Wear a helmet and other protective equipment during sports activities. If you have firearms in your house, make sure you follow all gun safety procedures. Seek help if you have been physically or sexually abused. What's next? Go to your health care provider once a year for an annual wellness visit. Ask your health care provider how often you should have your eyes and teeth checked. Stay up to date on all vaccines. This information is not intended to replace advice given to you by your health care provider. Make sure you discuss any questions you have with your health care provider. Document Revised: 03/17/2021 Document Reviewed: 03/17/2021 Elsevier Patient Education  2023 [employer] Inc.  

## 2022-09-30 LAB — CYTOLOGY - PAP
Comment: NEGATIVE
Diagnosis: UNDETERMINED — AB
High risk HPV: NEGATIVE

## 2022-10-10 DIAGNOSIS — F321 Major depressive disorder, single episode, moderate: Secondary | ICD-10-CM | POA: Diagnosis not present

## 2022-10-24 DIAGNOSIS — F321 Major depressive disorder, single episode, moderate: Secondary | ICD-10-CM | POA: Diagnosis not present

## 2022-11-07 DIAGNOSIS — F321 Major depressive disorder, single episode, moderate: Secondary | ICD-10-CM | POA: Diagnosis not present

## 2022-11-21 DIAGNOSIS — F321 Major depressive disorder, single episode, moderate: Secondary | ICD-10-CM | POA: Diagnosis not present

## 2022-11-28 DIAGNOSIS — F321 Major depressive disorder, single episode, moderate: Secondary | ICD-10-CM | POA: Diagnosis not present

## 2022-11-29 DIAGNOSIS — Z794 Long term (current) use of insulin: Secondary | ICD-10-CM | POA: Diagnosis not present

## 2022-11-29 DIAGNOSIS — E108 Type 1 diabetes mellitus with unspecified complications: Secondary | ICD-10-CM | POA: Diagnosis not present

## 2022-11-29 DIAGNOSIS — E109 Type 1 diabetes mellitus without complications: Secondary | ICD-10-CM | POA: Diagnosis not present

## 2022-12-05 DIAGNOSIS — F321 Major depressive disorder, single episode, moderate: Secondary | ICD-10-CM | POA: Diagnosis not present

## 2022-12-12 DIAGNOSIS — F321 Major depressive disorder, single episode, moderate: Secondary | ICD-10-CM | POA: Diagnosis not present

## 2022-12-19 DIAGNOSIS — F321 Major depressive disorder, single episode, moderate: Secondary | ICD-10-CM | POA: Diagnosis not present

## 2022-12-26 DIAGNOSIS — F321 Major depressive disorder, single episode, moderate: Secondary | ICD-10-CM | POA: Diagnosis not present

## 2023-01-02 DIAGNOSIS — F321 Major depressive disorder, single episode, moderate: Secondary | ICD-10-CM | POA: Diagnosis not present

## 2023-01-09 DIAGNOSIS — F321 Major depressive disorder, single episode, moderate: Secondary | ICD-10-CM | POA: Diagnosis not present

## 2023-01-13 DIAGNOSIS — F321 Major depressive disorder, single episode, moderate: Secondary | ICD-10-CM | POA: Diagnosis not present

## 2023-01-23 DIAGNOSIS — F321 Major depressive disorder, single episode, moderate: Secondary | ICD-10-CM | POA: Diagnosis not present

## 2023-01-26 ENCOUNTER — Ambulatory Visit: Payer: BC Managed Care – PPO | Admitting: Primary Care

## 2023-01-30 DIAGNOSIS — F321 Major depressive disorder, single episode, moderate: Secondary | ICD-10-CM | POA: Diagnosis not present

## 2023-01-31 DIAGNOSIS — E109 Type 1 diabetes mellitus without complications: Secondary | ICD-10-CM | POA: Diagnosis not present

## 2023-01-31 DIAGNOSIS — Z1330 Encounter for screening examination for mental health and behavioral disorders, unspecified: Secondary | ICD-10-CM | POA: Diagnosis not present

## 2023-02-06 DIAGNOSIS — F321 Major depressive disorder, single episode, moderate: Secondary | ICD-10-CM | POA: Diagnosis not present

## 2023-02-27 DIAGNOSIS — Z794 Long term (current) use of insulin: Secondary | ICD-10-CM | POA: Diagnosis not present

## 2023-02-27 DIAGNOSIS — E108 Type 1 diabetes mellitus with unspecified complications: Secondary | ICD-10-CM | POA: Diagnosis not present

## 2023-02-27 DIAGNOSIS — E109 Type 1 diabetes mellitus without complications: Secondary | ICD-10-CM | POA: Diagnosis not present
